# Patient Record
Sex: Female | Born: 1980
Health system: Southern US, Community
[De-identification: ages and names within clinical notes are randomized; demographics above are authoritative.]

## PROBLEM LIST (undated history)

## (undated) DIAGNOSIS — E785 Hyperlipidemia, unspecified: Secondary | ICD-10-CM

## (undated) DIAGNOSIS — E7800 Pure hypercholesterolemia, unspecified: Secondary | ICD-10-CM

## (undated) DIAGNOSIS — O99350 Diseases of the nervous system complicating pregnancy, unspecified trimester: Secondary | ICD-10-CM

## (undated) DIAGNOSIS — N926 Irregular menstruation, unspecified: Secondary | ICD-10-CM

## (undated) DIAGNOSIS — F419 Anxiety disorder, unspecified: Secondary | ICD-10-CM

## (undated) DIAGNOSIS — T7840XA Allergy, unspecified, initial encounter: Secondary | ICD-10-CM

## (undated) DIAGNOSIS — Z8619 Personal history of other infectious and parasitic diseases: Secondary | ICD-10-CM

## (undated) DIAGNOSIS — B079 Viral wart, unspecified: Secondary | ICD-10-CM

## (undated) DIAGNOSIS — G5600 Carpal tunnel syndrome, unspecified upper limb: Secondary | ICD-10-CM

## (undated) DIAGNOSIS — N809 Endometriosis, unspecified: Secondary | ICD-10-CM

## (undated) DIAGNOSIS — Z0000 Encounter for general adult medical examination without abnormal findings: Secondary | ICD-10-CM

## (undated) DIAGNOSIS — L309 Dermatitis, unspecified: Secondary | ICD-10-CM

## (undated) HISTORY — DX: Viral wart, unspecified: B07.9

## (undated) HISTORY — DX: Irregular menstruation, unspecified: N92.6

## (undated) HISTORY — DX: Carpal tunnel syndrome, unspecified upper limb: G56.00

## (undated) HISTORY — DX: Dermatitis, unspecified: L30.9

## (undated) HISTORY — DX: Anxiety disorder, unspecified: F41.9

## (undated) HISTORY — DX: Personal history of other infectious and parasitic diseases: Z86.19

## (undated) HISTORY — DX: Allergy, unspecified, initial encounter: T78.40XA

## (undated) HISTORY — DX: Hyperlipidemia, unspecified: E78.5

## (undated) HISTORY — DX: Encounter for general adult medical examination without abnormal findings: Z00.00

## (undated) HISTORY — DX: Pure hypercholesterolemia, unspecified: E78.00

## (undated) HISTORY — DX: Endometriosis, unspecified: N80.9

## (undated) HISTORY — DX: Diseases of the nervous system complicating pregnancy, unspecified trimester: O99.350

## (undated) HISTORY — PX: MYRINGOTOMY: SUR874

---

## 2010-03-22 HISTORY — PX: EYE SURGERY: SHX253

## 2011-03-05 LAB — OB RESULTS CONSOLE HEPATITIS B SURFACE ANTIGEN: Hepatitis B Surface Ag: NEGATIVE

## 2011-03-05 LAB — OB RESULTS CONSOLE RPR: RPR: NONREACTIVE

## 2011-03-05 LAB — OB RESULTS CONSOLE GC/CHLAMYDIA: Chlamydia: NEGATIVE

## 2011-03-23 DIAGNOSIS — O99350 Diseases of the nervous system complicating pregnancy, unspecified trimester: Secondary | ICD-10-CM

## 2011-03-23 DIAGNOSIS — G5600 Carpal tunnel syndrome, unspecified upper limb: Secondary | ICD-10-CM

## 2011-03-23 HISTORY — DX: Carpal tunnel syndrome, unspecified upper limb: O99.350

## 2011-03-23 HISTORY — DX: Carpal tunnel syndrome, unspecified upper limb: G56.00

## 2011-10-07 ENCOUNTER — Telehealth (HOSPITAL_COMMUNITY): Payer: Self-pay | Admitting: *Deleted

## 2011-10-07 ENCOUNTER — Encounter (HOSPITAL_COMMUNITY): Payer: Self-pay | Admitting: *Deleted

## 2011-10-07 NOTE — Telephone Encounter (Signed)
Preadmission screen  

## 2011-10-10 ENCOUNTER — Inpatient Hospital Stay (HOSPITAL_COMMUNITY): Payer: 59 | Admitting: Anesthesiology

## 2011-10-10 ENCOUNTER — Encounter (HOSPITAL_COMMUNITY): Payer: Self-pay

## 2011-10-10 ENCOUNTER — Encounter (HOSPITAL_COMMUNITY): Payer: Self-pay | Admitting: Anesthesiology

## 2011-10-10 ENCOUNTER — Inpatient Hospital Stay (HOSPITAL_COMMUNITY)
Admission: AD | Admit: 2011-10-10 | Discharge: 2011-10-13 | DRG: 766 | Disposition: A | Discharge status: Home / Self Care | Payer: 59 | Source: Ambulatory Visit | Attending: Obstetrics and Gynecology | Admitting: Obstetrics and Gynecology

## 2011-10-10 LAB — CBC
MCH: 28.9 pg (ref 26.0–34.0)
MCV: 84.3 fL (ref 78.0–100.0)
Platelets: 259 10*3/uL (ref 150–400)
RDW: 12.7 % (ref 11.5–15.5)
WBC: 16 10*3/uL — ABNORMAL HIGH (ref 4.0–10.5)

## 2011-10-10 MED ORDER — OXYTOCIN 40 UNITS IN LACTATED RINGERS INFUSION - SIMPLE MED
62.5000 mL/h | Freq: Once | INTRAVENOUS | Status: DC
  Filled 2011-10-10: qty 1000

## 2011-10-10 MED ORDER — PHENYLEPHRINE 40 MCG/ML (10ML) SYRINGE FOR IV PUSH (FOR BLOOD PRESSURE SUPPORT): 80.0000 ug | PREFILLED_SYRINGE | INTRAVENOUS | Status: DC | PRN

## 2011-10-10 MED ORDER — MORPHINE SULFATE 0.5 MG/ML IJ SOLN
INTRAMUSCULAR | Status: AC
  Filled 2011-10-10: qty 10

## 2011-10-10 MED ORDER — FENTANYL 2.5 MCG/ML BUPIVACAINE 1/10 % EPIDURAL INFUSION (WH - ANES)
14.0000 mL/h | INTRAMUSCULAR | Status: DC
  Administered 2011-10-10 (×2): 14 mL/h via EPIDURAL
  Filled 2011-10-10 (×4): qty 60

## 2011-10-10 MED ORDER — BUTORPHANOL TARTRATE 2 MG/ML IJ SOLN
1.0000 mg | Freq: Once | INTRAMUSCULAR | Status: AC
Start: 2011-10-10 — End: 2011-10-10
  Administered 2011-10-10: 1 mg via INTRAVENOUS

## 2011-10-10 MED ORDER — CITRIC ACID-SODIUM CITRATE 334-500 MG/5ML PO SOLN: 30.0000 mL | ORAL | Status: DC | PRN

## 2011-10-10 MED ORDER — ONDANSETRON HCL 4 MG/2ML IJ SOLN: 4.0000 mg | Freq: Four times a day (QID) | INTRAMUSCULAR | Status: DC | PRN

## 2011-10-10 MED ORDER — LACTATED RINGERS IV SOLN
INTRAVENOUS | Status: DC
  Administered 2011-10-10: 14:00:00 via INTRAVENOUS

## 2011-10-10 MED ORDER — DIPHENHYDRAMINE HCL 50 MG/ML IJ SOLN: 12.5000 mg | INTRAMUSCULAR | Status: DC | PRN

## 2011-10-10 MED ORDER — LIDOCAINE HCL (PF) 1 % IJ SOLN
INTRAMUSCULAR | Status: DC | PRN
  Administered 2011-10-10 (×2): 5 mL

## 2011-10-10 MED ORDER — LIDOCAINE HCL (PF) 1 % IJ SOLN: 30.0000 mL | INTRAMUSCULAR | Status: DC | PRN

## 2011-10-10 MED ORDER — PHENYLEPHRINE 40 MCG/ML (10ML) SYRINGE FOR IV PUSH (FOR BLOOD PRESSURE SUPPORT)
80.0000 ug | PREFILLED_SYRINGE | INTRAVENOUS | Status: DC | PRN
  Filled 2011-10-10: qty 5

## 2011-10-10 MED ORDER — LACTATED RINGERS IV SOLN: 500.0000 mL | INTRAVENOUS | Status: DC | PRN

## 2011-10-10 MED ORDER — LACTATED RINGERS IV SOLN: 500.0000 mL | Freq: Once | INTRAVENOUS | Status: DC

## 2011-10-10 MED ORDER — OXYCODONE-ACETAMINOPHEN 5-325 MG PO TABS: 1.0000 | ORAL_TABLET | ORAL | Status: DC | PRN

## 2011-10-10 MED ORDER — EPHEDRINE 5 MG/ML INJ
10.0000 mg | INTRAVENOUS | Status: DC | PRN
  Filled 2011-10-10: qty 4

## 2011-10-10 MED ORDER — ONDANSETRON HCL 4 MG/2ML IJ SOLN
INTRAMUSCULAR | Status: AC
  Filled 2011-10-10: qty 2

## 2011-10-10 MED ORDER — EPHEDRINE 5 MG/ML INJ: 10.0000 mg | INTRAVENOUS | Status: DC | PRN

## 2011-10-10 MED ORDER — OXYTOCIN BOLUS FROM INFUSION
250.0000 mL | Freq: Once | INTRAVENOUS | Status: DC
  Filled 2011-10-10: qty 500

## 2011-10-10 MED ORDER — FLEET ENEMA 7-19 GM/118ML RE ENEM: 1.0000 | ENEMA | RECTAL | Status: DC | PRN

## 2011-10-10 MED ORDER — OXYTOCIN 10 UNIT/ML IJ SOLN
INTRAMUSCULAR | Status: AC
  Filled 2011-10-10: qty 4

## 2011-10-10 MED ORDER — IBUPROFEN 600 MG PO TABS: 600.0000 mg | ORAL_TABLET | Freq: Four times a day (QID) | ORAL | Status: DC | PRN

## 2011-10-10 MED ORDER — BUTORPHANOL TARTRATE 2 MG/ML IJ SOLN
1.0000 mg | INTRAMUSCULAR | Status: AC
  Administered 2011-10-10: 1 mg via INTRAMUSCULAR
  Filled 2011-10-10 (×2): qty 1

## 2011-10-10 MED ORDER — ACETAMINOPHEN 325 MG PO TABS
650.0000 mg | ORAL_TABLET | ORAL | Status: DC | PRN
  Filled 2011-10-10: qty 2

## 2011-10-10 NOTE — Progress Notes (Signed)
Dr Rana Snare notified of patient, patient is very uncomfortable (wants to try natural birth), ctx pattern, sve results. Order to administer stadol 1mg  im and may repeat in an hour. Call dr Rana Snare with update.

## 2011-10-10 NOTE — H&P (Signed)
Stacy Dennis is a 31 y.o. female presenting for labor check.  Painful back and front ctxs q 3-5 minutes.  GBS-. History OB History    Grav Para Term Preterm Abortions TAB SAB Ect Mult Living   1              Past Medical History  Diagnosis Date  . Pregnancy related carpal tunnel syndrome, antepartum   . Endometriosis   . H/O varicella   . Hypercholesteremia     no meds   Past Surgical History  Procedure Date  . Eye surgery 2012    lasik  . Myringotomy    Family History: family history includes Anxiety disorder in her mother; Cancer in her paternal grandfather; Crohn's disease in her paternal aunt; Hyperlipidemia in her father and paternal grandmother; Kidney disease in her father; Stroke in her paternal grandfather; and Thyroid disease in her maternal aunt. Social History:  reports that she has never smoked. She has never used smokeless tobacco. She reports that she does not drink alcohol or use illicit drugs.   Prenatal Transfer Tool  Maternal Diabetes: No Genetic Screening: Normal Maternal Ultrasounds/Referrals: Normal Fetal Ultrasounds or other Referrals:  None Maternal Substance Abuse:  No Significant Maternal Medications:  None Significant Maternal Lab Results:  None Other Comments:  None  ROS  Dilation: 3 Effacement (%): 90 Station: -2 Exam by:: Tarrance Januszewski Blood pressure 124/90, pulse 104, temperature 97.8 F (36.6 C), temperature source Oral, resp. rate 18, height 4\' 11"  (1.499 m), weight 72.576 kg (160 lb), last menstrual period 01/02/2011. Exam Physical Exam  Prenatal labs: ABO, Rh: B/Positive/-- (12/14 0000) Antibody: Negative (12/14 0000) Rubella: Immune (12/14 0000) RPR: Nonreactive (12/14 0000)  HBsAg: Negative (12/14 0000)  HIV: Non-reactive (12/14 0000)  GBS: Negative (06/18 0000)   Assessment/Plan: IUP at term with labor.  Some relief with stadol but is considering epidural Pt has borderline pelvis, so will monitor progression   Sharay Bellissimo  C 10/10/2011, 10:34 AM

## 2011-10-10 NOTE — Anesthesia Preprocedure Evaluation (Signed)
Anesthesia Evaluation  Patient identified by MRN, date of birth, ID band Patient awake    Reviewed: Allergy & Precautions, H&P , Patient's Chart, lab work & pertinent test results  Airway Mallampati: II TM Distance: >3 FB Neck ROM: full    Dental No notable dental hx.    Pulmonary neg pulmonary ROS,  breath sounds clear to auscultation  Pulmonary exam normal       Cardiovascular negative cardio ROS  Rhythm:regular Rate:Normal     Neuro/Psych  Neuromuscular disease negative neurological ROS  negative psych ROS   GI/Hepatic negative GI ROS, Neg liver ROS,   Endo/Other  negative endocrine ROS  Renal/GU negative Renal ROS     Musculoskeletal   Abdominal   Peds  Hematology negative hematology ROS (+)   Anesthesia Other Findings Pregnancy related carpal tunnel syndrome, antepartum     Endometriosis        H/O varicella     Hypercholesteremia   no meds    Reproductive/Obstetrics (+) Pregnancy                           Anesthesia Physical Anesthesia Plan  ASA: II  Anesthesia Plan: Epidural   Post-op Pain Management:    Induction:   Airway Management Planned:   Additional Equipment:   Intra-op Plan:   Post-operative Plan:   Informed Consent: I have reviewed the patients History and Physical, chart, labs and discussed the procedure including the risks, benefits and alternatives for the proposed anesthesia with the patient or authorized representative who has indicated his/her understanding and acceptance.     Plan Discussed with:   Anesthesia Plan Comments:         Anesthesia Quick Evaluation

## 2011-10-10 NOTE — Anesthesia Procedure Notes (Signed)
Epidural Patient location during procedure: OB Start time: 10/10/2011 11:03 AM  Staffing Anesthesiologist: Brayton Caves R Performed by: anesthesiologist   Preanesthetic Checklist Completed: patient identified, site marked, surgical consent, pre-op evaluation, timeout performed, IV checked, risks and benefits discussed and monitors and equipment checked  Epidural Patient position: sitting Prep: site prepped and draped and DuraPrep Patient monitoring: continuous pulse ox and blood pressure Approach: midline Injection technique: LOR air and LOR saline  Needle:  Needle type: Tuohy  Needle gauge: 17 G Needle length: 9 cm Needle insertion depth: 5 cm cm Catheter type: closed end flexible Catheter size: 19 Gauge Catheter at skin depth: 10 cm Test dose: negative  Assessment Events: blood not aspirated, injection not painful, no injection resistance, negative IV test and no paresthesia  Additional Notes Patient identified.  Risk benefits discussed including failed block, incomplete pain control, headache, nerve damage, paralysis, blood pressure changes, nausea, vomiting, reactions to medication both toxic or allergic, and postpartum back pain.  Patient expressed understanding and wished to proceed.  All questions were answered.  Sterile technique used throughout procedure and epidural site dressed with sterile barrier dressing. No paresthesia or other complications noted.The patient did not experience any signs of intravascular injection such as tinnitus or metallic taste in mouth nor signs of intrathecal spread such as rapid motor block. Please see nursing notes for vital signs.

## 2011-10-10 NOTE — Progress Notes (Signed)
Patient requests her cervix to be re-checked at 0800 and if unchanged she will take the ordered dose of stadol and go home- Dr. Rana Snare notified. If cervical exam is changed from previous will call Dr. Rana Snare for plan of care.

## 2011-10-10 NOTE — MAU Note (Signed)
Patient is in with c/o frequent/painful contractions, pelvic pressure and intense back pain since 2300. She reports good fetal movement, denies lof.

## 2011-10-11 ENCOUNTER — Encounter (HOSPITAL_COMMUNITY): Payer: Self-pay | Admitting: *Deleted

## 2011-10-11 LAB — CBC
HCT: 27.7 % — ABNORMAL LOW (ref 36.0–46.0)
Hemoglobin: 9.7 g/dL — ABNORMAL LOW (ref 12.0–15.0)
MCH: 29.7 pg (ref 26.0–34.0)
RBC: 3.27 MIL/uL — ABNORMAL LOW (ref 3.87–5.11)

## 2011-10-11 MED ORDER — GENTAMICIN SULFATE 40 MG/ML IJ SOLN: Freq: Once | INTRAVENOUS | Status: DC

## 2011-10-11 MED ORDER — SIMETHICONE 80 MG PO CHEW
80.0000 mg | CHEWABLE_TABLET | Freq: Three times a day (TID) | ORAL | Status: DC
  Administered 2011-10-11 – 2011-10-13 (×9): 80 mg via ORAL

## 2011-10-11 MED ORDER — SIMETHICONE 80 MG PO CHEW: 80.0000 mg | CHEWABLE_TABLET | Freq: Three times a day (TID) | ORAL | Status: DC

## 2011-10-11 MED ORDER — ZOLPIDEM TARTRATE 5 MG PO TABS: 5.0000 mg | ORAL_TABLET | Freq: Every evening | ORAL | Status: DC | PRN

## 2011-10-11 MED ORDER — ONDANSETRON HCL 4 MG PO TABS: 4.0000 mg | ORAL_TABLET | ORAL | Status: DC | PRN

## 2011-10-11 MED ORDER — LANOLIN HYDROUS EX OINT: 1.0000 "application " | TOPICAL_OINTMENT | CUTANEOUS | Status: DC | PRN

## 2011-10-11 MED ORDER — NALBUPHINE HCL 10 MG/ML IJ SOLN
5.0000 mg | INTRAMUSCULAR | Status: DC | PRN
  Filled 2011-10-11: qty 1

## 2011-10-11 MED ORDER — SIMETHICONE 80 MG PO CHEW: 80.0000 mg | CHEWABLE_TABLET | ORAL | Status: DC | PRN

## 2011-10-11 MED ORDER — ONDANSETRON HCL 4 MG/2ML IJ SOLN: 4.0000 mg | INTRAMUSCULAR | Status: DC | PRN

## 2011-10-11 MED ORDER — MENTHOL 3 MG MT LOZG: 1.0000 | LOZENGE | OROMUCOSAL | Status: DC | PRN

## 2011-10-11 MED ORDER — DIPHENHYDRAMINE HCL 50 MG/ML IJ SOLN: 25.0000 mg | INTRAMUSCULAR | Status: DC | PRN

## 2011-10-11 MED ORDER — KETOROLAC TROMETHAMINE 60 MG/2ML IM SOLN
60.0000 mg | Freq: Once | INTRAMUSCULAR | Status: AC | PRN
  Filled 2011-10-11: qty 2

## 2011-10-11 MED ORDER — KETOROLAC TROMETHAMINE 30 MG/ML IJ SOLN
INTRAMUSCULAR | Status: AC
  Filled 2011-10-11: qty 1

## 2011-10-11 MED ORDER — DIBUCAINE 1 % RE OINT: 1.0000 "application " | TOPICAL_OINTMENT | RECTAL | Status: DC | PRN

## 2011-10-11 MED ORDER — WITCH HAZEL-GLYCERIN EX PADS: 1.0000 "application " | MEDICATED_PAD | CUTANEOUS | Status: DC | PRN

## 2011-10-11 MED ORDER — DIPHENHYDRAMINE HCL 25 MG PO CAPS: 25.0000 mg | ORAL_CAPSULE | ORAL | Status: DC | PRN

## 2011-10-11 MED ORDER — KETOROLAC TROMETHAMINE 30 MG/ML IJ SOLN: 30.0000 mg | Freq: Four times a day (QID) | INTRAMUSCULAR | Status: AC | PRN

## 2011-10-11 MED ORDER — OXYCODONE-ACETAMINOPHEN 5-325 MG PO TABS: 1.0000 | ORAL_TABLET | ORAL | Status: DC | PRN

## 2011-10-11 MED ORDER — SODIUM CHLORIDE 0.9 % IJ SOLN: 3.0000 mL | INTRAMUSCULAR | Status: DC | PRN

## 2011-10-11 MED ORDER — NALOXONE HCL 0.4 MG/ML IJ SOLN: 0.4000 mg | INTRAMUSCULAR | Status: DC | PRN

## 2011-10-11 MED ORDER — ONDANSETRON HCL 4 MG/2ML IJ SOLN: 4.0000 mg | Freq: Three times a day (TID) | INTRAMUSCULAR | Status: DC | PRN

## 2011-10-11 MED ORDER — IBUPROFEN 600 MG PO TABS: 600.0000 mg | ORAL_TABLET | Freq: Four times a day (QID) | ORAL | Status: DC | PRN

## 2011-10-11 MED ORDER — DIPHENHYDRAMINE HCL 25 MG PO CAPS: 25.0000 mg | ORAL_CAPSULE | Freq: Four times a day (QID) | ORAL | Status: DC | PRN

## 2011-10-11 MED ORDER — SCOPOLAMINE 1 MG/3DAYS TD PT72
1.0000 | MEDICATED_PATCH | Freq: Once | TRANSDERMAL | Status: DC
  Filled 2011-10-11: qty 1

## 2011-10-11 MED ORDER — LACTATED RINGERS IV SOLN: INTRAVENOUS | Status: DC

## 2011-10-11 MED ORDER — SODIUM CHLORIDE 0.9 % IV SOLN
1.0000 ug/kg/h | INTRAVENOUS | Status: DC | PRN
  Filled 2011-10-11: qty 2.5

## 2011-10-11 MED ORDER — PRENATAL MULTIVITAMIN CH: 1.0000 | ORAL_TABLET | Freq: Every day | ORAL | Status: DC

## 2011-10-11 MED ORDER — SENNOSIDES-DOCUSATE SODIUM 8.6-50 MG PO TABS
2.0000 | ORAL_TABLET | Freq: Every day | ORAL | Status: DC
Start: 2011-10-11 — End: 2011-10-13
  Administered 2011-10-12: 2 via ORAL

## 2011-10-11 MED ORDER — DIPHENHYDRAMINE HCL 50 MG/ML IJ SOLN: 12.5000 mg | INTRAMUSCULAR | Status: DC | PRN

## 2011-10-11 MED ORDER — IBUPROFEN 600 MG PO TABS
600.0000 mg | ORAL_TABLET | Freq: Four times a day (QID) | ORAL | Status: DC
  Administered 2011-10-11 – 2011-10-13 (×9): 600 mg via ORAL
  Filled 2011-10-11 (×9): qty 1

## 2011-10-11 MED ORDER — PRENATAL MULTIVITAMIN CH
1.0000 | ORAL_TABLET | Freq: Every day | ORAL | Status: DC
  Filled 2011-10-11 (×3): qty 1

## 2011-10-11 MED ORDER — MEPERIDINE HCL 25 MG/ML IJ SOLN: 6.2500 mg | INTRAMUSCULAR | Status: DC | PRN

## 2011-10-11 MED ORDER — SCOPOLAMINE 1 MG/3DAYS TD PT72
MEDICATED_PATCH | TRANSDERMAL | Status: AC
  Filled 2011-10-11: qty 1

## 2011-10-11 MED ORDER — METOCLOPRAMIDE HCL 5 MG/ML IJ SOLN: 10.0000 mg | Freq: Three times a day (TID) | INTRAMUSCULAR | Status: DC | PRN

## 2011-10-11 NOTE — Op Note (Signed)
Cesarean Section Procedure Note  Pre-operative Diagnosis: IUP at 40+ weeks, Arrest of dilation, Nonreassuring fetal heart rate  Post-operative Diagnosis: same plus meconium   Surgeon: Artist Bloom C   Assistants:   Anesthesia:epidural  Indications:  Slow progression throughout day to 6cm and despite Pitocin augmentation to adequate labor pattern, no further dilation beyond 6 cm.  Fetal tachycardia to 170 with several, non repetative late decels, so decision for primary C/S made.  R&B discussed and informed consent obtained.  Procedure:  Low Segment Transverse cesarean section  Procedure Details  The patient was seen in the Holding Room. The risks, benefits, complications, treatment options, and expected outcomes were discussed with the patient.  The patient concurred with the proposed plan, giving informed consent.  The site of surgery properly noted/marked.. A Time Out was held and the above information confirmed.  After induction of anesthesia, the patient was draped and prepped in the usual sterile manner. A Pfannenstiel incision was made and carried down through the subcutaneous tissue to the fascia. Fascial incision was made and extended transversely. The fascia was separated from the underlying rectus tissue superiorly and inferiorly. The peritoneum was identified and entered. Peritoneal incision was extended longitudinally. The utero-vesical peritoneal reflection was incised transversely and the bladder flap was bluntly freed from the lower uterine segment. A low transverse uterine incision was made. Delivered from LOT presentation was a  baby with Apgar scores of 8 at one minute and 9 at five minutes. After the umbilical cord was clamped and cut cord blood was obtained for evaluation. The placenta was removed intact and appeared normal. The uterine outline, tubes and ovaries appeared normal. The uterine incision was closed with running locked sutures of 0 monocryl and imbricated with 0  monocryl. Hemostasis was observed. Lavage was carried out until clear. The peritoneum was then closed with 0 monocryl and rectus muscles plicated in the midline.  After hemostasis was assured, the fascia was then reapproximated with running sutures of 0 Vicryl. Irrigation was applied and after adequate hemostasis was assured, the skin was reapproximated with staples.  Instrument, sponge, and needle counts were correct prior the abdominal closure and at the conclusion of the case. The patient received clindamycin and gentamicin preoperatively.  Findings: Viable female, ph art 7.25  Estimated Blood Loss:  600cc         Specimens: Placenta was sent to L&D         Complications:  None

## 2011-10-11 NOTE — Progress Notes (Signed)
Subjective: Postpartum Day 1: Cesarean Delivery Patient reports tolerating PO.    Objective: Vital signs in last 24 hours: Temp:  [98.4 F (36.9 C)-98.8 F (37.1 C)] 98.4 F (36.9 C) (07/22 0450) Pulse Rate:  [43-132] 96  (07/22 0450) Resp:  [18] 18  (07/22 0450) BP: (101-138)/(59-90) 108/69 mmHg (07/22 0450) SpO2:  [87 %-100 %] 95 % (07/22 0450) Weight:  [72.576 kg (160 lb)] 72.576 kg (160 lb) (07/21 0914)  Physical Exam:  General: alert, cooperative and no distress Lochia: appropriate Uterine Fundus: firm Incision: Dressing C&D DVT Evaluation: No evidence of DVT seen on physical exam.   Basename 10/11/11 0515 10/10/11 0820  HGB 9.7* 12.7  HCT 27.7* 37.1    Assessment/Plan: Status post Cesarean section. Doing well postoperatively.  Continue current care.  Vesper Trant II,Floretta Petro E 10/11/2011, 8:02 AM

## 2011-10-11 NOTE — Anesthesia Postprocedure Evaluation (Signed)
  Anesthesia Post-op Note  Patient: Stacy Dennis  Procedure(s) Performed: * No surgery found *  Patient Location: Mother/Baby  Anesthesia Type: Epidural  Level of Consciousness: awake  Airway and Oxygen Therapy: Patient Spontanous Breathing  Post-op Pain: none  Post-op Assessment: Patient's Cardiovascular Status Stable, Respiratory Function Stable, Patent Airway, No signs of Nausea or vomiting, Adequate PO intake, Pain level controlled, No headache, No backache, No residual numbness and No residual motor weakness  Post-op Vital Signs: Reviewed and stable  Complications: No apparent anesthesia complications

## 2011-10-11 NOTE — Progress Notes (Signed)
UR chart review completed.  

## 2011-10-12 ENCOUNTER — Encounter (HOSPITAL_COMMUNITY): Payer: Self-pay | Admitting: *Deleted

## 2011-10-12 ENCOUNTER — Inpatient Hospital Stay (HOSPITAL_COMMUNITY): Admission: RE | Admit: 2011-10-12 | Payer: 59 | Source: Ambulatory Visit

## 2011-10-12 LAB — CBC
MCH: 29.6 pg (ref 26.0–34.0)
Platelets: 198 10*3/uL (ref 150–400)
RBC: 3.14 MIL/uL — ABNORMAL LOW (ref 3.87–5.11)
RDW: 13.3 % (ref 11.5–15.5)
WBC: 17.2 10*3/uL — ABNORMAL HIGH (ref 4.0–10.5)

## 2011-10-12 NOTE — Progress Notes (Signed)
Subjective: Postpartum Day 2: Cesarean Delivery Patient reports tolerating PO, + flatus and no problems voiding.    Objective: Vital signs in last 24 hours: Temp:  [98 F (36.7 C)-98.4 F (36.9 C)] 98.3 F (36.8 C) (07/23 0600) Pulse Rate:  [82-100] 82  (07/23 0600) Resp:  [18-20] 18  (07/23 0600) BP: (101-111)/(64-70) 103/68 mmHg (07/23 0600) SpO2:  [96 %] 96 % (07/22 2230)  Physical Exam:  General: alert and cooperative Lochia: appropriate Uterine Fundus: firm Incision: healing well, staples intact DVT Evaluation: No evidence of DVT seen on physical exam. Negative Homan's sign.   Basename 10/12/11 0545 10/11/11 0515  HGB 9.3* 9.7*  HCT 27.4* 27.7*    Assessment/Plan: Status post Cesarean section. Doing well postoperatively.  Continue current care.  Orah Sonnen G 10/12/2011, 7:52 AM

## 2011-10-13 MED ORDER — OXYCODONE-ACETAMINOPHEN 5-325 MG PO TABS: 1.0000 | ORAL_TABLET | ORAL | Status: AC | PRN

## 2011-10-13 MED ORDER — IBUPROFEN 600 MG PO TABS: 600.0000 mg | ORAL_TABLET | Freq: Four times a day (QID) | ORAL | Status: AC

## 2011-10-13 NOTE — Discharge Summary (Signed)
Obstetric Discharge Summary Reason for Admission: onset of labor Prenatal Procedures: ultrasound Intrapartum Procedures: cesarean: low cervical, transverse Postpartum Procedures: none Complications-Operative and Postpartum: none Hemoglobin  Date Value Range Status  10/12/2011 9.3* 12.0 - 15.0 g/dL Final     HCT  Date Value Range Status  10/12/2011 27.4* 36.0 - 46.0 % Final    Physical Exam:  General: alert and cooperative Lochia: appropriate Uterine Fundus: firm Incision: healing well, staples removed DVT Evaluation: No evidence of DVT seen on physical exam.  Discharge Diagnoses: Term Pregnancy-delivered  Discharge Information: Date: 10/13/2011 Activity: pelvic rest Diet: routine Medications: PNV, Ibuprofen and Percocet Condition: stable Instructions: refer to practice specific booklet Discharge to: home   Newborn Data: Live born female  Birth Weight: 8 lb 0.4 oz (3640 g) APGAR: 8, 9  Home with mother.  Stacy Dennis G 10/13/2011, 8:31 AM

## 2011-12-28 DIAGNOSIS — H179 Unspecified corneal scar and opacity: Secondary | ICD-10-CM | POA: Insufficient documentation

## 2012-01-28 ENCOUNTER — Encounter: Payer: Self-pay | Admitting: Family Medicine

## 2012-01-28 ENCOUNTER — Ambulatory Visit (INDEPENDENT_AMBULATORY_CARE_PROVIDER_SITE_OTHER): Payer: 59 | Admitting: Family Medicine

## 2012-01-28 VITALS — BP 115/78 | HR 64 | Temp 98.4°F | Ht 59.0 in | Wt 124.8 lb

## 2012-01-28 DIAGNOSIS — E785 Hyperlipidemia, unspecified: Secondary | ICD-10-CM

## 2012-01-28 DIAGNOSIS — Z0000 Encounter for general adult medical examination without abnormal findings: Secondary | ICD-10-CM

## 2012-01-28 DIAGNOSIS — B079 Viral wart, unspecified: Secondary | ICD-10-CM | POA: Insufficient documentation

## 2012-01-28 DIAGNOSIS — T7840XA Allergy, unspecified, initial encounter: Secondary | ICD-10-CM

## 2012-01-28 HISTORY — DX: Allergy, unspecified, initial encounter: T78.40XA

## 2012-01-28 HISTORY — DX: Hyperlipidemia, unspecified: E78.5

## 2012-01-28 HISTORY — DX: Viral wart, unspecified: B07.9

## 2012-01-28 HISTORY — DX: Encounter for general adult medical examination without abnormal findings: Z00.00

## 2012-01-28 MED ORDER — KRILL OIL PO CAPS: ORAL_CAPSULE | ORAL | Status: DC

## 2012-01-28 NOTE — Assessment & Plan Note (Signed)
May try Claritin and Allegra prn

## 2012-01-28 NOTE — Progress Notes (Signed)
Patient ID: Stacy Dennis, female   DOB: 08-07-1980, 31 y.o.   MRN: [id number] SHAWN DANNENBERG [id number] March 09, 1981 01/28/2012      Progress Note New Patient  Subjective  Chief Complaint  Chief Complaint  Patient presents with  . Establish Care    new patient    HPI  Patient is a 31 year old Caucasian female who is in today for new patient appointment. She has some mild for today. Mostly she wants to just establish care. She's recently moved to the area and has just delivered her first child who is now 38 months of age. She works as a Adult nurse in the consistent. She has a recurrence route as lesions on her left shoulder which she's had frozen multiple times but unfortunately they then returned. They're not itchy or painful. She also notes chisel long history of elevated cholesterol which runs in her family. Both of her parents struggle with this. She does exercise regularly avoids transplant and tries to eat a heart healthy diet. No other recent injury or concerns. No fevers. She is of some low-grade congestion secondary to allergies but is not taking any medications and she is nursing her child.  Physical Exam  Constitutional: She is oriented to person, place, and time and well-developed, well-nourished, and in no distress. No distress.  HENT:  Head: Normocephalic and atraumatic.  Right Ear: External ear normal.  Left Ear: External ear normal.  Nose: Nose normal.  Mouth/Throat: Oropharynx is clear and moist. No oropharyngeal exudate.  Eyes: Conjunctivae normal are normal. Pupils are equal, round, and reactive to light. Right eye exhibits no discharge. Left eye exhibits no discharge. No scleral icterus.  Neck: Normal range of motion. Neck supple. No thyromegaly present.  Cardiovascular: Normal rate, regular rhythm, normal heart sounds and intact distal pulses.   No murmur heard. Pulmonary/Chest: Effort normal and breath sounds normal. No respiratory distress. She has no  wheezes. She has no rales.  Abdominal: Soft. Bowel sounds are normal. She exhibits no distension and no mass. There is no tenderness.  Musculoskeletal: Normal range of motion. She exhibits no edema and no tenderness.  Lymphadenopathy:    She has no cervical adenopathy.  Neurological: She is alert and oriented to person, place, and time. She has normal reflexes. No cranial nerve deficit. Coordination normal.  Skin: Skin is warm and dry. No rash noted. She is not diaphoretic.       3 verrucous lesions on right posterior shoulder, 2 pedunculated.  Psychiatric: Mood, memory and affect normal.    Past Medical History  Diagnosis Date  . Pregnancy related carpal tunnel syndrome, antepartum   . Endometriosis   . H/O varicella   . Hypercholesteremia     no meds  . Verrucous skin lesion 01/28/2012  . Hyperlipidemia 01/28/2012  . Preventative health care 01/28/2012    Past Surgical History  Procedure Date  . Eye surgery 2012    lasik  . Myringotomy   . Cesarean section 10-10-11    Family History  Problem Relation Age of Onset  . Anxiety disorder Mother   . Hyperlipidemia Mother   . Hypertension Mother   . Irritable bowel syndrome Mother     ?  . Kidney disease Father     stones  . Hyperlipidemia Father   . Thyroid disease Maternal Aunt   . Cancer Paternal Grandfather     lung- smoker  . Stroke Paternal Grandfather   . Aneurysm Paternal Grandfather   . Crohn's disease Paternal  Aunt   . Hyperlipidemia Paternal Grandmother   . Hypertension Maternal Grandmother   . Heart disease Maternal Grandfather     History   Social History  . Marital Status: Married    Spouse Name: N/A    Number of Children: N/A  . Years of Education: N/A   Occupational History  . Not on file.   Social History Main Topics  . Smoking status: Never Smoker   . Smokeless tobacco: Never Used  . Alcohol Use: Yes     Comment: socially  . Drug Use: No  . Sexually Active: Yes -- Female partner(s)   Other  Topics Concern  . Not on file   Social History Narrative  . No narrative on file    Current Outpatient Prescriptions on File Prior to Visit  Medication Sig Dispense Refill  . Prenatal Vit-Fe Fumarate-FA (PRENATAL MULTIVITAMIN) TABS Take 1 tablet by mouth daily.        Allergies  Allergen Reactions  . Amoxicillin Nausea And Vomiting  . Lactose Intolerance (Gi)     Review of Systems  Review of Systems  Constitutional: Negative for fever, chills and malaise/fatigue.  HENT: Negative for hearing loss, nosebleeds and congestion.   Eyes: Negative for discharge.  Respiratory: Negative for cough, sputum production, shortness of breath and wheezing.   Cardiovascular: Negative for chest pain, palpitations and leg swelling.  Gastrointestinal: Negative for heartburn, nausea, vomiting, abdominal pain, diarrhea, constipation and blood in stool.  Genitourinary: Negative for dysuria, urgency, frequency and hematuria.  Musculoskeletal: Negative for myalgias, back pain and falls.  Skin: Positive for rash.       Lesions right shoulder  Neurological: Negative for dizziness, tremors, sensory change, focal weakness, loss of consciousness, weakness and headaches.  Endo/Heme/Allergies: Negative for polydipsia. Does not bruise/bleed easily.  Psychiatric/Behavioral: Negative for depression and suicidal ideas. The patient is not nervous/anxious and does not have insomnia.     Objective  BP 115/78  Pulse 64  Temp 98.4 F (36.9 C) (Temporal)  Ht 4\' 11"  (1.499 m)  Wt 124 lb 12.8 oz (56.609 kg)  BMI 25.21 kg/m2  SpO2 98%  LMP 12/28/2011  Breastfeeding? Yes  Physical Exam  See above     Assessment & Plan  Hyperlipidemia Tries to eat a heart healthy diet and avoid trans fats. Exercises, encouraged to add MegaRed krill oil caps and recheck lipid panel, fasting next week.  Verrucous skin lesion Recurrent on right shoulder. Subjected to cryotherapy on the 3 lesions in office, patient  tolerated well, encouraged to take folic Acid 1 mg daily. Call for referral to dermatology if no improvement  Preventative health care Check fasting labs. Had flu shot in October and Tdap in January of 2013. Sees gyn and just delivered her first child 3 months ago. Encouraged heart healthy diet and ongoing exercise  Allergic state May try Claritin and Allegra prn

## 2012-01-28 NOTE — Assessment & Plan Note (Signed)
Recurrent on right shoulder. Subjected to cryotherapy on the 3 lesions in office, patient tolerated well, encouraged to take folic Acid 1 mg daily. Call for referral to dermatology if no improvement

## 2012-01-28 NOTE — Assessment & Plan Note (Signed)
Check fasting labs. Had flu shot in October and Tdap in January of 2013. Sees gyn and just delivered her first child 3 months ago. Encouraged heart healthy diet and ongoing exercise

## 2012-01-28 NOTE — Patient Instructions (Addendum)
Preventive Care for Adults, Female A healthy lifestyle and preventive care can promote health and wellness. Preventive health guidelines for women include the following key practices.  A routine yearly physical is a good way to check with your caregiver about your health and preventive screening. It is a chance to share any concerns and updates on your health, and to receive a thorough exam.  Visit your dentist for a routine exam and preventive care every 6 months. Brush your teeth twice a day and floss once a day. Good oral hygiene prevents tooth decay and gum disease.  The frequency of eye exams is based on your age, health, family medical history, use of contact lenses, and other factors. Follow your caregiver's recommendations for frequency of eye exams.  Eat a healthy diet. Foods like vegetables, fruits, whole grains, low-fat dairy products, and lean protein foods contain the nutrients you need without too many calories. Decrease your intake of foods high in solid fats, added sugars, and salt. Eat the right amount of calories for you.Get information about a proper diet from your caregiver, if necessary.  Regular physical exercise is one of the most important things you can do for your health. Most adults should get at least 150 minutes of moderate-intensity exercise (any activity that increases your heart rate and causes you to sweat) each week. In addition, most adults need muscle-strengthening exercises on 2 or more days a week.  Maintain a healthy weight. The body mass index (BMI) is a screening tool to identify possible weight problems. It provides an estimate of body fat based on height and weight. Your caregiver can help determine your BMI, and can help you achieve or maintain a healthy weight.For adults 20 years and older:  A BMI below 18.5 is considered underweight.  A BMI of 18.5 to 24.9 is normal.  A BMI of 25 to 29.9 is considered overweight.  A BMI of 30 and above is  considered obese.  Maintain normal blood lipids and cholesterol levels by exercising and minimizing your intake of saturated fat. Eat a balanced diet with plenty of fruit and vegetables. Blood tests for lipids and cholesterol should begin at age 20 and be repeated every 5 years. If your lipid or cholesterol levels are high, you are over 50, or you are at high risk for heart disease, you may need your cholesterol levels checked more frequently.Ongoing high lipid and cholesterol levels should be treated with medicines if diet and exercise are not effective.  If you smoke, find out from your caregiver how to quit. If you do not use tobacco, do not start.  If you are pregnant, do not drink alcohol. If you are breastfeeding, be very cautious about drinking alcohol. If you are not pregnant and choose to drink alcohol, do not exceed 1 drink per day. One drink is considered to be 12 ounces (355 mL) of beer, 5 ounces (148 mL) of wine, or 1.5 ounces (44 mL) of liquor.  Avoid use of street drugs. Do not share needles with anyone. Ask for help if you need support or instructions about stopping the use of drugs.  High blood pressure causes heart disease and increases the risk of stroke. Your blood pressure should be checked at least every 1 to 2 years. Ongoing high blood pressure should be treated with medicines if weight loss and exercise are not effective.  If you are 55 to 31 years old, ask your caregiver if you should take aspirin to prevent strokes.  Diabetes   screening involves taking a blood sample to check your fasting blood sugar level. This should be done once every 3 years, after age 45, if you are within normal weight and without risk factors for diabetes. Testing should be considered at a younger age or be carried out more frequently if you are overweight and have at least 1 risk factor for diabetes.  Breast cancer screening is essential preventive care for women. You should practice "breast  self-awareness." This means understanding the normal appearance and feel of your breasts and may include breast self-examination. Any changes detected, no matter how small, should be reported to a caregiver. Women in their 20s and 30s should have a clinical breast exam (CBE) by a caregiver as part of a regular health exam every 1 to 3 years. After age 40, women should have a CBE every year. Starting at age 40, women should consider having a mammography (breast X-ray test) every year. Women who have a family history of breast cancer should talk to their caregiver about genetic screening. Women at a high risk of breast cancer should talk to their caregivers about having magnetic resonance imaging (MRI) and a mammography every year.  The Pap test is a screening test for cervical cancer. A Pap test can show cell changes on the cervix that might become cervical cancer if left untreated. A Pap test is a procedure in which cells are obtained and examined from the lower end of the uterus (cervix).  Women should have a Pap test starting at age 21.  Between ages 21 and 29, Pap tests should be repeated every 2 years.  Beginning at age 30, you should have a Pap test every 3 years as long as the past 3 Pap tests have been normal.  Some women have medical problems that increase the chance of getting cervical cancer. Talk to your caregiver about these problems. It is especially important to talk to your caregiver if a new problem develops soon after your last Pap test. In these cases, your caregiver may recommend more frequent screening and Pap tests.  The above recommendations are the same for women who have or have not gotten the vaccine for human papillomavirus (HPV).  If you had a hysterectomy for a problem that was not cancer or a condition that could lead to cancer, then you no longer need Pap tests. Even if you no longer need a Pap test, a regular exam is a good idea to make sure no other problems are  starting.  If you are between ages 65 and 70, and you have had normal Pap tests going back 10 years, you no longer need Pap tests. Even if you no longer need a Pap test, a regular exam is a good idea to make sure no other problems are starting.  If you have had past treatment for cervical cancer or a condition that could lead to cancer, you need Pap tests and screening for cancer for at least 20 years after your treatment.  If Pap tests have been discontinued, risk factors (such as a new sexual partner) need to be reassessed to determine if screening should be resumed.  The HPV test is an additional test that may be used for cervical cancer screening. The HPV test looks for the virus that can cause the cell changes on the cervix. The cells collected during the Pap test can be tested for HPV. The HPV test could be used to screen women aged 30 years and older, and should   be used in women of any age who have unclear Pap test results. After the age of 30, women should have HPV testing at the same frequency as a Pap test.  Colorectal cancer can be detected and often prevented. Most routine colorectal cancer screening begins at the age of 50 and continues through age 75. However, your caregiver may recommend screening at an earlier age if you have risk factors for colon cancer. On a yearly basis, your caregiver may provide home test kits to check for hidden blood in the stool. Use of a small camera at the end of a tube, to directly examine the colon (sigmoidoscopy or colonoscopy), can detect the earliest forms of colorectal cancer. Talk to your caregiver about this at age 50, when routine screening begins. Direct examination of the colon should be repeated every 5 to 10 years through age 75, unless early forms of pre-cancerous polyps or small growths are found.  Hepatitis C blood testing is recommended for all people born from 1945 through 1965 and any individual with known risks for hepatitis C.  Practice  safe sex. Use condoms and avoid high-risk sexual practices to reduce the spread of sexually transmitted infections (STIs). STIs include gonorrhea, chlamydia, syphilis, trichomonas, herpes, HPV, and human immunodeficiency virus (HIV). Herpes, HIV, and HPV are viral illnesses that have no cure. They can result in disability, cancer, and death. Sexually active women aged 25 and younger should be checked for chlamydia. Older women with new or multiple partners should also be tested for chlamydia. Testing for other STIs is recommended if you are sexually active and at increased risk.  Osteoporosis is a disease in which the bones lose minerals and strength with aging. This can result in serious bone fractures. The risk of osteoporosis can be identified using a bone density scan. Women ages 65 and over and women at risk for fractures or osteoporosis should discuss screening with their caregivers. Ask your caregiver whether you should take a calcium supplement or vitamin D to reduce the rate of osteoporosis.  Menopause can be associated with physical symptoms and risks. Hormone replacement therapy is available to decrease symptoms and risks. You should talk to your caregiver about whether hormone replacement therapy is right for you.  Use sunscreen with sun protection factor (SPF) of 30 or more. Apply sunscreen liberally and repeatedly throughout the day. You should seek shade when your shadow is shorter than you. Protect yourself by wearing long sleeves, pants, a wide-brimmed hat, and sunglasses year round, whenever you are outdoors.  Once a month, do a whole body skin exam, using a mirror to look at the skin on your back. Notify your caregiver of new moles, moles that have irregular borders, moles that are larger than a pencil eraser, or moles that have changed in shape or color.  Stay current with required immunizations.  Influenza. You need a dose every fall (or winter). The composition of the flu vaccine  changes each year, so being vaccinated once is not enough.  Pneumococcal polysaccharide. You need 1 to 2 doses if you smoke cigarettes or if you have certain chronic medical conditions. You need 1 dose at age 65 (or older) if you have never been vaccinated.  Tetanus, diphtheria, pertussis (Tdap, Td). Get 1 dose of Tdap vaccine if you are younger than age 65, are over 65 and have contact with an infant, are a healthcare worker, are pregnant, or simply want to be protected from whooping cough. After that, you need a Td   booster dose every 10 years. Consult your caregiver if you have not had at least 3 tetanus and diphtheria-containing shots sometime in your life or have a deep or dirty wound.  HPV. You need this vaccine if you are a woman age 26 or younger. The vaccine is given in 3 doses over 6 months.  Measles, mumps, rubella (MMR). You need at least 1 dose of MMR if you were born in 1957 or later. You may also need a second dose.  Meningococcal. If you are age 19 to 21 and a first-year college student living in a residence hall, or have one of several medical conditions, you need to get vaccinated against meningococcal disease. You may also need additional booster doses.  Zoster (shingles). If you are age 60 or older, you should get this vaccine.  Varicella (chickenpox). If you have never had chickenpox or you were vaccinated but received only 1 dose, talk to your caregiver to find out if you need this vaccine.  Hepatitis A. You need this vaccine if you have a specific risk factor for hepatitis A virus infection or you simply wish to be protected from this disease. The vaccine is usually given as 2 doses, 6 to 18 months apart.  Hepatitis B. You need this vaccine if you have a specific risk factor for hepatitis B virus infection or you simply wish to be protected from this disease. The vaccine is given in 3 doses, usually over 6 months. Preventive Services / Frequency Ages 19 to 39  Blood  pressure check.** / Every 1 to 2 years.  Lipid and cholesterol check.** / Every 5 years beginning at age 20.  Clinical breast exam.** / Every 3 years for women in their 20s and 30s.  Pap test.** / Every 2 years from ages 21 through 29. Every 3 years starting at age 30 through age 65 or 70 with a history of 3 consecutive normal Pap tests.  HPV screening.** / Every 3 years from ages 30 through ages 65 to 70 with a history of 3 consecutive normal Pap tests.  Hepatitis C blood test.** / For any individual with known risks for hepatitis C.  Skin self-exam. / Monthly.  Influenza immunization.** / Every year.  Pneumococcal polysaccharide immunization.** / 1 to 2 doses if you smoke cigarettes or if you have certain chronic medical conditions.  Tetanus, diphtheria, pertussis (Tdap, Td) immunization. / A one-time dose of Tdap vaccine. After that, you need a Td booster dose every 10 years.  HPV immunization. / 3 doses over 6 months, if you are 26 and younger.  Measles, mumps, rubella (MMR) immunization. / You need at least 1 dose of MMR if you were born in 1957 or later. You may also need a second dose.  Meningococcal immunization. / 1 dose if you are age 19 to 21 and a first-year college student living in a residence hall, or have one of several medical conditions, you need to get vaccinated against meningococcal disease. You may also need additional booster doses.  Varicella immunization.** / Consult your caregiver.  Hepatitis A immunization.** / Consult your caregiver. 2 doses, 6 to 18 months apart.  Hepatitis B immunization.** / Consult your caregiver. 3 doses usually over 6 months. Ages 40 to 64  Blood pressure check.** / Every 1 to 2 years.  Lipid and cholesterol check.** / Every 5 years beginning at age 20.  Clinical breast exam.** / Every year after age 40.  Mammogram.** / Every year beginning at age 40   and continuing for as long as you are in good health. Consult with your  caregiver.  Pap test.** / Every 3 years starting at age 30 through age 65 or 70 with a history of 3 consecutive normal Pap tests.  HPV screening.** / Every 3 years from ages 30 through ages 65 to 70 with a history of 3 consecutive normal Pap tests.  Fecal occult blood test (FOBT) of stool. / Every year beginning at age 50 and continuing until age 75. You may not need to do this test if you get a colonoscopy every 10 years.  Flexible sigmoidoscopy or colonoscopy.** / Every 5 years for a flexible sigmoidoscopy or every 10 years for a colonoscopy beginning at age 50 and continuing until age 75.  Hepatitis C blood test.** / For all people born from 1945 through 1965 and any individual with known risks for hepatitis C.  Skin self-exam. / Monthly.  Influenza immunization.** / Every year.  Pneumococcal polysaccharide immunization.** / 1 to 2 doses if you smoke cigarettes or if you have certain chronic medical conditions.  Tetanus, diphtheria, pertussis (Tdap, Td) immunization.** / A one-time dose of Tdap vaccine. After that, you need a Td booster dose every 10 years.  Measles, mumps, rubella (MMR) immunization. / You need at least 1 dose of MMR if you were born in 1957 or later. You may also need a second dose.  Varicella immunization.** / Consult your caregiver.  Meningococcal immunization.** / Consult your caregiver.  Hepatitis A immunization.** / Consult your caregiver. 2 doses, 6 to 18 months apart.  Hepatitis B immunization.** / Consult your caregiver. 3 doses, usually over 6 months. Ages 65 and over  Blood pressure check.** / Every 1 to 2 years.  Lipid and cholesterol check.** / Every 5 years beginning at age 20.  Clinical breast exam.** / Every year after age 40.  Mammogram.** / Every year beginning at age 40 and continuing for as long as you are in good health. Consult with your caregiver.  Pap test.** / Every 3 years starting at age 30 through age 65 or 70 with a 3  consecutive normal Pap tests. Testing can be stopped between 65 and 70 with 3 consecutive normal Pap tests and no abnormal Pap or HPV tests in the past 10 years.  HPV screening.** / Every 3 years from ages 30 through ages 65 or 70 with a history of 3 consecutive normal Pap tests. Testing can be stopped between 65 and 70 with 3 consecutive normal Pap tests and no abnormal Pap or HPV tests in the past 10 years.  Fecal occult blood test (FOBT) of stool. / Every year beginning at age 50 and continuing until age 75. You may not need to do this test if you get a colonoscopy every 10 years.  Flexible sigmoidoscopy or colonoscopy.** / Every 5 years for a flexible sigmoidoscopy or every 10 years for a colonoscopy beginning at age 50 and continuing until age 75.  Hepatitis C blood test.** / For all people born from 1945 through 1965 and any individual with known risks for hepatitis C.  Osteoporosis screening.** / A one-time screening for women ages 65 and over and women at risk for fractures or osteoporosis.  Skin self-exam. / Monthly.  Influenza immunization.** / Every year.  Pneumococcal polysaccharide immunization.** / 1 dose at age 65 (or older) if you have never been vaccinated.  Tetanus, diphtheria, pertussis (Tdap, Td) immunization. / A one-time dose of Tdap vaccine if you are over   65 and have contact with an infant, are a healthcare worker, or simply want to be protected from whooping cough. After that, you need a Td booster dose every 10 years.  Varicella immunization.** / Consult your caregiver.  Meningococcal immunization.** / Consult your caregiver.  Hepatitis A immunization.** / Consult your caregiver. 2 doses, 6 to 18 months apart.  Hepatitis B immunization.** / Check with your caregiver. 3 doses, usually over 6 months. ** Family history and personal history of risk and conditions may change your caregiver's recommendations. Document Released: 05/04/2001 Document Revised: 05/31/2011  Document Reviewed: 08/03/2010 [employer] Patient Information 2013 [employer].  

## 2012-01-28 NOTE — Assessment & Plan Note (Signed)
Tries to eat a heart healthy diet and avoid trans fats. Exercises, encouraged to add MegaRed krill oil caps and recheck lipid panel, fasting next week.

## 2012-02-01 ENCOUNTER — Telehealth: Payer: Self-pay

## 2012-02-01 NOTE — Telephone Encounter (Signed)
Message copied by Court Joy on Tue Feb 01, 2012  3:54 PM ------      Message from: Danise Edge A      Created: Fri Jan 28, 2012  3:47 PM       Please call and let her know I looked up the antihistamines after she left and confirmed that Allegra and Claritin are listed as the safest for breast feeding

## 2012-02-01 NOTE — Telephone Encounter (Signed)
Patient informed and voiced understanding

## 2012-02-01 NOTE — Telephone Encounter (Signed)
Message copied by Court Joy on Tue Feb 01, 2012  3:55 PM ------      Message from: Danise Edge A      Created: Fri Jan 28, 2012  3:47 PM       Please call and let her know I looked up the antihistamines after she left and confirmed that Allegra and Claritin are listed as the safest for breast feeding

## 2012-02-03 ENCOUNTER — Other Ambulatory Visit (INDEPENDENT_AMBULATORY_CARE_PROVIDER_SITE_OTHER): Payer: 59

## 2012-02-03 DIAGNOSIS — E785 Hyperlipidemia, unspecified: Secondary | ICD-10-CM

## 2012-02-03 DIAGNOSIS — Z0000 Encounter for general adult medical examination without abnormal findings: Secondary | ICD-10-CM

## 2012-02-03 LAB — RENAL FUNCTION PANEL
Albumin: 4.2 g/dL (ref 3.5–5.2)
Glucose, Bld: 92 mg/dL (ref 70–99)
Phosphorus: 4.1 mg/dL (ref 2.3–4.6)
Potassium: 4.1 mEq/L (ref 3.5–5.1)
Sodium: 139 mEq/L (ref 135–145)

## 2012-02-03 LAB — HEPATIC FUNCTION PANEL
Bilirubin, Direct: 0 mg/dL (ref 0.0–0.3)
Total Bilirubin: 0.5 mg/dL (ref 0.3–1.2)
Total Protein: 6.8 g/dL (ref 6.0–8.3)

## 2012-02-03 LAB — LIPID PANEL
Cholesterol: 242 mg/dL — ABNORMAL HIGH (ref 0–200)
Total CHOL/HDL Ratio: 3
Triglycerides: 54 mg/dL (ref 0.0–149.0)

## 2012-02-03 LAB — CBC
MCHC: 32.9 g/dL (ref 30.0–36.0)
MCV: 87.4 fl (ref 78.0–100.0)

## 2012-03-14 ENCOUNTER — Telehealth: Payer: Self-pay

## 2012-03-14 MED ORDER — AZITHROMYCIN 250 MG PO TABS: 250.0000 mg | ORAL_TABLET | Freq: Every day | ORAL | Status: DC

## 2012-03-14 NOTE — Telephone Encounter (Signed)
Patient called Call a Nurse yesterday stating that she has a cough/congestion/ temp of 99.4. Pt states was worse yesterday (03-12-12) was aching and having chills, chills are gone today/ pt has taken Tylenol/ home care advice given/ all emergent sx ruled out per cough protocol.  Call was taken by Sula Rumple

## 2012-03-14 NOTE — Telephone Encounter (Signed)
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 Per MD don't take the Mucinex then. Pt voiced understanding and RX sent to pharmacy

## 2012-03-14 NOTE — Telephone Encounter (Signed)
Can have Azithromycin 250 mg 2 tabs po once and then 1 tab po daily x 4 days. Probiotics and mucinex x next 10 days

## 2012-05-07 ENCOUNTER — Other Ambulatory Visit: Payer: Self-pay

## 2012-09-07 ENCOUNTER — Ambulatory Visit: Payer: 59 | Admitting: Family Medicine

## 2012-09-21 ENCOUNTER — Ambulatory Visit: Payer: 59 | Admitting: Family Medicine

## 2012-09-21 ENCOUNTER — Ambulatory Visit (INDEPENDENT_AMBULATORY_CARE_PROVIDER_SITE_OTHER): Payer: 59 | Admitting: Family Medicine

## 2012-09-21 ENCOUNTER — Encounter: Payer: Self-pay | Admitting: Family Medicine

## 2012-09-21 VITALS — BP 102/70 | HR 75 | Temp 97.9°F | Resp 16 | Ht 59.0 in | Wt 121.0 lb

## 2012-09-21 DIAGNOSIS — R5383 Other fatigue: Secondary | ICD-10-CM

## 2012-09-21 DIAGNOSIS — B079 Viral wart, unspecified: Secondary | ICD-10-CM

## 2012-09-21 DIAGNOSIS — R5381 Other malaise: Secondary | ICD-10-CM

## 2012-09-21 DIAGNOSIS — E785 Hyperlipidemia, unspecified: Secondary | ICD-10-CM

## 2012-09-21 LAB — CBC
HCT: 38.9 % (ref 36.0–46.0)
Platelets: 330 10*3/uL (ref 150–400)
RDW: 13.2 % (ref 11.5–15.5)
WBC: 8.6 10*3/uL (ref 4.0–10.5)

## 2012-09-21 NOTE — Assessment & Plan Note (Addendum)
Check lipids and avoid trans fats, patient never started [employer] will consider after labs available

## 2012-09-21 NOTE — Assessment & Plan Note (Signed)
Cryotherapy applied to 3 lesions on right shoulder

## 2012-09-21 NOTE — Patient Instructions (Addendum)
Next appt prior to visit lipid, renal, cbc, tsh, hepatic at [facility] Next visit annual   Cholesterol Cholesterol is a white, waxy, fat-like protein needed by your body in small amounts. The liver makes all the cholesterol you need. It is carried from the liver by the blood through the blood vessels. Deposits (plaque) may build up on blood vessel walls. This makes the arteries narrower and stiffer. Plaque increases the risk for heart attack and stroke. You cannot feel your cholesterol level even if it is very high. The only way to know is by a blood test to check your lipid (fats) levels. Once you know your cholesterol levels, you should keep a record of the test results. Work with your caregiver to to keep your levels in the desired range. WHAT THE RESULTS MEAN:  Total cholesterol is a rough measure of all the cholesterol in your blood.  LDL is the so-called bad cholesterol. This is the type that deposits cholesterol in the walls of the arteries. You want this level to be low.  HDL is the good cholesterol because it cleans the arteries and carries the LDL away. You want this level to be high.  Triglycerides are fat that the body can either burn for energy or store. High levels are closely linked to heart disease. DESIRED LEVELS:  Total cholesterol below 200.  LDL below 100 for people at risk, below 70 for very high risk.  HDL above 50 is good, above 60 is best.  Triglycerides below 150. HOW TO LOWER YOUR CHOLESTEROL:  Diet.  Choose fish or white meat chicken and [place], roasted or baked. Limit fatty cuts of red meat, fried foods, and processed meats, such as sausage and lunch meat.  Eat lots of fresh fruits and vegetables. Choose whole grains, beans, pasta, potatoes and cereals.  Use only small amounts of olive, corn or canola oils. Avoid butter, mayonnaise, shortening or palm kernel oils. Avoid foods with trans-fats.  Use skim/nonfat milk and low-fat/nonfat yogurt and cheeses.  Avoid whole milk, cream, ice cream, egg yolks and cheeses. Healthy desserts include angel food cake, ginger snaps, animal crackers, hard candy, popsicles, and low-fat/nonfat frozen yogurt. Avoid pastries, cakes, pies and cookies.  Exercise.  A regular program helps decrease LDL and raises HDL.  Helps with weight control.  Do things that increase your activity level like gardening, walking, or taking the stairs.  Medication.  May be prescribed by your caregiver to help lowering cholesterol and the risk for heart disease.  You may need medicine even if your levels are normal if you have several risk factors. HOME CARE INSTRUCTIONS   Follow your diet and exercise programs as suggested by your caregiver.  Take medications as directed.  Have blood work done when your caregiver feels it is necessary. MAKE SURE YOU:   Understand these instructions.  Will watch your condition.  Will get help right away if you are not doing well or get worse. Document Released: 12/01/2000 Document Revised: 05/31/2011 Document Reviewed: 05/24/2007 [facility] Patient Information 2014 [place], [place].

## 2012-09-21 NOTE — Progress Notes (Signed)
Patient ID: Stacy Dennis, female   DOB: Nov 10, 1980, 32 y.o.   MRN: [id number] Stacy Dennis [id number] 05-09-1980 09/21/2012      Progress Note-Follow Up  Subjective  Chief Complaint  Chief Complaint  Patient presents with  . skin lesion    Pt here for skin lesions on her right shoulder. Had them frozen previously.    HPI  Patient is a 32 year old Caucasian female who is here today in followup. She is still not taking her Krill oil but has been trying to maintain a heart healthy diet. She's been avoiding trans fats and fried foods. . She's been exercising more and is hoping that her cholesterol is improved. She did have a recent episode of GI upset and diarrhea but that has resolved. She denies any fevers and chills with this episode she denies any vomiting with this episode. Otherwise she feels well. She continues to nurse her young son. No chest pain, palpitations, fevers, GI or GU complaints  Past Medical History  Diagnosis Date  . Pregnancy related carpal tunnel syndrome, antepartum   . Endometriosis   . H/O varicella   . Hypercholesteremia     no meds  . Verrucous skin lesion 01/28/2012  . Hyperlipidemia 01/28/2012  . Preventative health care 01/28/2012  . Allergic state 01/28/2012    Past Surgical History  Procedure Laterality Date  . Eye surgery  2012    lasik  . Myringotomy    . Cesarean section  10-10-11    Family History  Problem Relation Age of Onset  . Anxiety disorder Mother   . Hyperlipidemia Mother   . Hypertension Mother   . Irritable bowel syndrome Mother     ?  . Kidney disease Father     stones  . Hyperlipidemia Father   . Thyroid disease Maternal Aunt   . Cancer Paternal Grandfather     lung- smoker  . Stroke Paternal Grandfather   . Aneurysm Paternal Grandfather   . Crohn's disease Paternal Aunt   . Hyperlipidemia Paternal Grandmother   . Hypertension Maternal Grandmother   . Heart disease Maternal Grandfather     History   Social  History  . Marital Status: Married    Spouse Name: N/A    Number of Children: N/A  . Years of Education: N/A   Occupational History  . Not on file.   Social History Main Topics  . Smoking status: Never Smoker   . Smokeless tobacco: Never Used  . Alcohol Use: Yes     Comment: socially  . Drug Use: No  . Sexually Active: Yes -- Female partner(s)   Other Topics Concern  . Not on file   Social History Narrative  . No narrative on file    Current Outpatient Prescriptions on File Prior to Visit  Medication Sig Dispense Refill  . Prenatal Vit-Fe Fumarate-FA (PRENATAL MULTIVITAMIN) TABS Take 1 tablet by mouth daily.      Boris Lown Oil CAPS MegaRed cap daily krill oil by Schiff       No current facility-administered medications on file prior to visit.    Allergies  Allergen Reactions  . Amoxicillin Nausea And Vomiting  . Lactose Intolerance (Gi)     Review of Systems  Review of Systems  Constitutional: Negative for fever and malaise/fatigue.  HENT: Negative for congestion.   Eyes: Negative for pain and discharge.  Respiratory: Negative for shortness of breath.   Cardiovascular: Negative for chest pain, palpitations and leg swelling.  Gastrointestinal:  Negative for nausea, abdominal pain and diarrhea.  Genitourinary: Negative for dysuria.  Musculoskeletal: Negative for falls.  Skin: Negative for rash.  Neurological: Negative for loss of consciousness and headaches.  Endo/Heme/Allergies: Negative for polydipsia.  Psychiatric/Behavioral: Negative for depression and suicidal ideas. The patient is not nervous/anxious and does not have insomnia.     Objective  BP 102/70  Pulse 75  Temp(Src) 97.9 F (36.6 C) (Oral)  Resp 16  Ht 4\' 11"  (1.499 m)  Wt 121 lb (54.885 kg)  BMI 24.43 kg/m2  SpO2 97%  Physical Exam  Physical Exam  Constitutional: She is oriented to person, place, and time and well-developed, well-nourished, and in no distress. No distress.  HENT:  Head:  Normocephalic and atraumatic.  Eyes: Conjunctivae are normal.  Neck: Neck supple. No thyromegaly present.  Cardiovascular: Normal rate, regular rhythm and normal heart sounds.   No murmur heard. Pulmonary/Chest: Effort normal and breath sounds normal. She has no wheezes.  Abdominal: She exhibits no distension and no mass.  Musculoskeletal: She exhibits no edema.  Lymphadenopathy:    She has no cervical adenopathy.  Neurological: She is alert and oriented to person, place, and time.  Skin: Skin is warm and dry. No rash noted. She is not diaphoretic.  3 small, verrucous lesions on right shoulder  Psychiatric: Memory, affect and judgment normal.    Lab Results  Component Value Date   TSH 0.92 02/03/2012   Lab Results  Component Value Date   WBC 5.4 02/03/2012   HGB 12.7 02/03/2012   HCT 38.6 02/03/2012   MCV 87.4 02/03/2012   PLT 277.0 02/03/2012   Lab Results  Component Value Date   CREATININE 0.6 02/03/2012   BUN 14 02/03/2012   NA 139 02/03/2012   K 4.1 02/03/2012   CL 108 02/03/2012   CO2 24 02/03/2012   Lab Results  Component Value Date   ALT 28 02/03/2012   AST 28 02/03/2012   ALKPHOS 72 02/03/2012   BILITOT 0.5 02/03/2012   Lab Results  Component Value Date   CHOL 242* 02/03/2012   Lab Results  Component Value Date   HDL 75.60 02/03/2012   No results found for this basename: [facility]   Lab Results  Component Value Date   TRIG 54.0 02/03/2012   Lab Results  Component Value Date   CHOLHDL 3 02/03/2012     Assessment & Plan  Hyperlipidemia Check lipids and avoid trans fats, patient never started [employer] will consider after labs available  Verrucous skin lesion Cryotherapy applied to 3 lesions on right shoulder

## 2012-09-22 LAB — LIPID PANEL
HDL: 67 mg/dL (ref >39)
LDL Cholesterol: 116 mg/dL — ABNORMAL HIGH (ref 0–99)
VLDL: 15 mg/dL (ref 0–40)

## 2012-09-22 LAB — HEPATIC FUNCTION PANEL
ALT: 18 U/L (ref 0–35)
Bilirubin, Direct: 0.1 mg/dL (ref 0.0–0.3)
Indirect Bilirubin: 0.5 mg/dL (ref 0.0–0.9)
Total Bilirubin: 0.6 mg/dL (ref 0.3–1.2)

## 2012-10-05 ENCOUNTER — Encounter: Payer: Self-pay | Admitting: Family Medicine

## 2012-10-13 ENCOUNTER — Ambulatory Visit: Payer: 59 | Admitting: Family Medicine

## 2013-01-02 ENCOUNTER — Ambulatory Visit (INDEPENDENT_AMBULATORY_CARE_PROVIDER_SITE_OTHER): Payer: 59 | Admitting: Sports Medicine

## 2013-01-02 ENCOUNTER — Encounter: Payer: Self-pay | Admitting: Sports Medicine

## 2013-01-02 VITALS — BP 114/75 | HR 82 | Ht 59.5 in | Wt 121.0 lb

## 2013-01-02 DIAGNOSIS — M205X9 Other deformities of toe(s) (acquired), unspecified foot: Secondary | ICD-10-CM

## 2013-01-02 DIAGNOSIS — R269 Unspecified abnormalities of gait and mobility: Secondary | ICD-10-CM

## 2013-01-02 DIAGNOSIS — M2140 Flat foot [pes planus] (acquired), unspecified foot: Secondary | ICD-10-CM

## 2013-01-02 DIAGNOSIS — M2141 Flat foot [pes planus] (acquired), right foot: Secondary | ICD-10-CM | POA: Insufficient documentation

## 2013-01-02 DIAGNOSIS — M2142 Flat foot [pes planus] (acquired), left foot: Secondary | ICD-10-CM | POA: Insufficient documentation

## 2013-01-02 NOTE — Assessment & Plan Note (Signed)
Pt lower extremity exam depicts calcaneal valgus at the subtalar joint, collapse of the medial longitudinal arch with pes planus B/L, and slight hallux limitus along with elevation of her 5th MTP joints B/L.  She is not having significant pain, except for jumping exercises.  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  She may benefit from custom orthotics, which we will try today.  F/U PRN or if she needs adjustments or her orthotics.   Patient was fitted for a : custom orthotic  The orthotic was heated and afterward the patient stood on the orthotic blank positioned on the orthotic stand. The patient was positioned in subtalar neutral position and 10 degrees of ankle dorsiflexion in a weight bearing stance. After completion of molding, a stable base was applied to the orthotic blank. The blank was ground to a stable position for weight bearing. Size: 6 Base: Blue EVA Posting: Blue foam on heel Additional orthotic padding: None

## 2013-01-02 NOTE — Assessment & Plan Note (Signed)
Pt with loss of her medial longitudinal arch with slight collapse of the transverse arch B/L

## 2013-01-02 NOTE — Assessment & Plan Note (Addendum)
Pt with about 30 degrees of toe extension B/L and 30-45 degrees of toe flexion B/L.    We will follow to see if this helps relieve hte sxs adequately and if motion improves  Can [place] if not

## 2013-01-02 NOTE — Progress Notes (Signed)
Stacy Dennis is a 32 y.o. who presents today for orthotic evaluation.  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  She had trouble in her hindfoot at that time, requiring her to switch from a spike shoe to a turf shoe.  She has tried multiple shoes over the past several yrs and noticed the Stacy Dennis has been the most comfortable for her.  She does not have specific pain with walking or running, but does have some sharp midfoot pain if she does repetitive jumping motions.  She also noticed increase edema and discomfort in her feet when she was pregnant last year.  Otherwise, she denies pain, numbness, tingling, burning, previous injury to either foot or ankle. She works as a PT at [employer], and is consitenly on her feet throughout the day, not c/o pain at the end of the day in her feet.   Past Medical History  Diagnosis Date  . Pregnancy related carpal tunnel syndrome, antepartum   . Endometriosis   . H/O varicella   . Hypercholesteremia     no meds  . Verrucous skin lesion 01/28/2012  . Hyperlipidemia 01/28/2012  . Preventative health care 01/28/2012  . Allergic state 01/28/2012    History   Social History  . Marital Status: Married    Spouse Name: N/A    Number of Children: N/A  . Years of Education: N/A   Occupational History  . Not on file.   Social History Main Topics  . Smoking status: Never Smoker   . Smokeless tobacco: Never Used  . Alcohol Use: Yes     Comment: socially  . Drug Use: No  . Sexual Activity: Yes    Partners: Male   Other Topics Concern  . Not on file   Social History Narrative  . No narrative on file    Family History  Problem Relation Age of Onset  . Anxiety disorder Mother   . Hyperlipidemia Mother   . Hypertension Mother   . Irritable bowel syndrome Mother     ?  . Kidney disease Father     stones  . Hyperlipidemia Father   . Thyroid disease  Maternal Aunt   . Cancer Paternal Grandfather     lung- smoker  . Stroke Paternal Grandfather   . Aneurysm Paternal Grandfather   . Crohn's disease Paternal Aunt   . Hyperlipidemia Paternal Grandmother   . Hypertension Maternal Grandmother   . Heart disease Maternal Grandfather     Current Outpatient Prescriptions on File Prior to Visit  Medication Sig Dispense Refill  . Calcium-Vitamin D 600-200 MG-UNIT per tablet Take 1 tablet by mouth daily.      . Prenatal Vit-Fe Fumarate-FA (PRENATAL MULTIVITAMIN) TABS Take 1 tablet by mouth daily.      . vitamin C (ASCORBIC ACID) 500 MG tablet Take 500 mg by mouth daily.       No current facility-administered medications on file prior to visit.    Patient Information Form: Screening and ROS   Review of Symptoms 11 point ROS is negative  Physical Exam Filed Vitals:   01/02/13 1357  BP: 114/75  Pulse: 82    Gen: NAD, Well nourished, Well developed HEENT:  EOMI, [place]/AT Neck: no JVD Cardio: RRR, No murmurs/gallops/rubs Lungs: CTA, no wheezes, rhonchi, crackles Neuro: MS 5/5 B/L UE and LE, +2 patellar and achilles relfex b/l  Psych: AAO x 3  Ankle/Foot Exam: Calcaneal Valgus B/L with collapse of the medial longitudinal arch with pes planus present.   No visible erythema or swelling. Range of motion is full in all directions of her ankle joint  Strength is 5/5 in all directions. Stable lateral and medial ligaments Talar dome nontender No tenderness on posterior aspects of lateral and medial malleolus 1st MTP non tender, slight valgus deformity (<10 degrees B/L), ROM limited to 30 degrees of flexion/extension B/L.

## 2013-01-02 NOTE — Patient Instructions (Signed)

## 2013-01-25 ENCOUNTER — Other Ambulatory Visit: Payer: Self-pay

## 2013-03-27 ENCOUNTER — Ambulatory Visit (INDEPENDENT_AMBULATORY_CARE_PROVIDER_SITE_OTHER): Payer: 59 | Admitting: Sports Medicine

## 2013-03-27 ENCOUNTER — Encounter: Payer: Self-pay | Admitting: Sports Medicine

## 2013-03-27 VITALS — BP 122/85 | Ht 59.5 in | Wt 125.0 lb

## 2013-03-27 DIAGNOSIS — R269 Unspecified abnormalities of gait and mobility: Secondary | ICD-10-CM

## 2013-03-28 NOTE — Assessment & Plan Note (Signed)
Patient was fitted for a : standard, cushioned, semi-rigid orthotic. The orthotic was heated and afterward the patient stood on the orthotic blank positioned on the orthotic stand. The patient was positioned in subtalar neutral position and 10 degrees of ankle dorsiflexion in a weight bearing stance. After completion of molding, a stable base was applied to the orthotic blank. The blank was ground to a stable position for weight bearing. Size: 6 thin dress blank Base: red brick EVA Posting: none Additional orthotic padding: none  Preparaiton time was 40 mins.  Patient is to try this new or top of orthotic in other forms of shoes This did seem to give her about 90% correction of her pronation of gait  If this is tolerated I would like her to use orthotics as much as possible  Dir. evaluation and for preparation orthotics was 42 minutes

## 2013-03-28 NOTE — Progress Notes (Signed)
Patient ID: Stacy Dennis, female   DOB: 08/20/1980, 33 y.o.   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[employer] In October we saw her with significant foot issues and an abnormally pronated gait We placed her in custom orthotics and these have helped significantly  She is using these in sports shoes and 4 activity  She returns today to see if we can make additional pairs that would work in other types of shoes that she is to wear for work  Physical examination  No acute distress BP 122/85  Ht 4' 11.5" (1.511 m)  Wt 125 lb (56.7 kg)  BMI 24.83 kg/m2  As noted before the patient has significant pes planus with an abnormal standing posture She has also the longitudinal arch She also has hallux limitus This gives her an abnormally pronated gait The gait creates some element of genu valgus

## 2013-08-17 ENCOUNTER — Encounter: Payer: Self-pay | Admitting: Physician Assistant

## 2013-08-17 ENCOUNTER — Ambulatory Visit (INDEPENDENT_AMBULATORY_CARE_PROVIDER_SITE_OTHER): Payer: 59 | Admitting: Physician Assistant

## 2013-08-17 VITALS — BP 108/68 | HR 100 | Temp 98.3°F | Resp 16 | Ht 59.5 in | Wt 146.2 lb

## 2013-08-17 DIAGNOSIS — J329 Chronic sinusitis, unspecified: Secondary | ICD-10-CM

## 2013-08-17 MED ORDER — AZITHROMYCIN 250 MG PO TABS
ORAL_TABLET | ORAL | Status: DC
Start: 2013-08-17 — End: 2013-08-29

## 2013-08-17 NOTE — Progress Notes (Signed)
Pre visit review using our clinic review tool, if applicable. No additional management support is needed unless otherwise documented below in the visit note/SLS  

## 2013-08-17 NOTE — Progress Notes (Signed)
Patient presents to clinic today c/o greater than one week of sinus pressure, sinus pain, tooth pain, postnasal drip, scratchy throat and nonproductive cough. Patient denies fever, chills, shortness of breath, wheezing, or pleuritic chest pain. Denies recent travel or sick contact. Patient is currently pregnant and is in her second trimester.  Past Medical History  Diagnosis Date  . Pregnancy related carpal tunnel syndrome, antepartum   . Endometriosis   . H/O varicella   . Hypercholesteremia     no meds  . Verrucous skin lesion 01/28/2012  . Hyperlipidemia 01/28/2012  . Preventative health care 01/28/2012  . Allergic state 01/28/2012    Current Outpatient Prescriptions on File Prior to Visit  Medication Sig Dispense Refill  . Calcium-Vitamin D 600-200 MG-UNIT per tablet Take 1 tablet by mouth daily.      . Prenatal Vit-Fe Fumarate-FA (PRENATAL MULTIVITAMIN) TABS Take 1 tablet by mouth daily.      . vitamin C (ASCORBIC ACID) 500 MG tablet Take 500 mg by mouth daily.       No current facility-administered medications on file prior to visit.    Allergies  Allergen Reactions  . Amoxicillin Nausea And Vomiting  . Lactose Intolerance (Gi)     Family History  Problem Relation Age of Onset  . Anxiety disorder Mother   . Hyperlipidemia Mother   . Hypertension Mother   . Irritable bowel syndrome Mother     ?  . Kidney disease Father     stones  . Hyperlipidemia Father   . Thyroid disease Maternal Aunt   . Cancer Paternal Grandfather     lung- smoker  . Stroke Paternal Grandfather   . Aneurysm Paternal Grandfather   . Crohn's disease Paternal Aunt   . Hyperlipidemia Paternal Grandmother   . Hypertension Maternal Grandmother   . Heart disease Maternal Grandfather     History   Social History  . Marital Status: Married    Spouse Name: N/A    Number of Children: N/A  . Years of Education: N/A   Social History Main Topics  . Smoking status: Never Smoker   . Smokeless  tobacco: Never Used  . Alcohol Use: Yes     Comment: socially  . Drug Use: No  . Sexual Activity: Yes    Partners: Male   Other Topics Concern  . None   Social History Narrative  . None   Review of Systems - See HPI.  All other ROS are negative.  BP 108/68  Pulse 100  Temp(Src) 98.3 F (36.8 C) (Oral)  Resp 16  Ht 4' 11.5" (1.511 m)  Wt 146 lb 4 oz (66.339 kg)  BMI 29.06 kg/m2  SpO2 98%  Physical Exam  Vitals reviewed. Constitutional: She is oriented to person, place, and time and well-developed, well-nourished, and in no distress.  HENT:  Head: Normocephalic and atraumatic.  Right Ear: External ear normal.  Left Ear: External ear normal.  Nose: Nose normal.  Mouth/Throat: Oropharynx is clear and moist. No oropharyngeal exudate.  Tympanic membranes within normal limits bilaterally. Positive tenderness to percussion of sinuses noted.  Eyes: Conjunctivae are normal. Pupils are equal, round, and reactive to light.  Neck: Neck supple.  Cardiovascular: Normal rate, regular rhythm and intact distal pulses.   II/VI murmur, likely mammary souffle giving pregnancy status.  Pulmonary/Chest: Effort normal and breath sounds normal. No respiratory distress. She has no wheezes. She has no rales. She exhibits no tenderness.  Lymphadenopathy:    She has no cervical  adenopathy.  Neurological: She is alert and oriented to person, place, and time.  Skin: Skin is warm and dry. No rash noted.  Psychiatric: Affect normal.    No results found for this or any previous visit (from the past 2160 hour(s)).  Assessment/Plan: Sinusitis Azithromycin is pregnancy category B. Will Rx azithromycin. Increase fluid intake. Rest. Saline nasal spray. Humidifier in bedroom. Continue prenatal vitamins. Warm tea for cough. Call or return to clinic if symptoms not improving.

## 2013-08-17 NOTE — Patient Instructions (Signed)
Please take antibiotic as directed.  Increase fluid intake.  Use Saline nasal spray or Use Neti Pot but make sure to use distilled water.  Take a daily multivitamin. Place a humidifier in the bedroom.  Please call or return clinic if symptoms are not improving.  Sinusitis Sinusitis is redness, soreness, and swelling (inflammation) of the paranasal sinuses. Paranasal sinuses are air pockets within the bones of your face (beneath the eyes, the middle of the forehead, or above the eyes). In healthy paranasal sinuses, mucus is able to drain out, and air is able to circulate through them by way of your nose. However, when your paranasal sinuses are inflamed, mucus and air can become trapped. This can allow bacteria and other germs to grow and cause infection. Sinusitis can develop quickly and last only a short time (acute) or continue over a long period (chronic). Sinusitis that lasts for more than 12 weeks is considered chronic.  CAUSES  Causes of sinusitis include:  Allergies.  Structural abnormalities, such as displacement of the cartilage that separates your nostrils (deviated septum), which can decrease the air flow through your nose and sinuses and affect sinus drainage.  Functional abnormalities, such as when the small hairs (cilia) that line your sinuses and help remove mucus do not work properly or are not present. SYMPTOMS  Symptoms of acute and chronic sinusitis are the same. The primary symptoms are pain and pressure around the affected sinuses. Other symptoms include:  Upper toothache.  Earache.  Headache.  Bad breath.  Decreased sense of smell and taste.  A cough, which worsens when you are lying flat.  Fatigue.  Fever.  Thick drainage from your nose, which often is green and may contain pus (purulent).  Swelling and warmth over the affected sinuses. DIAGNOSIS  Your caregiver will perform a physical exam. During the exam, your caregiver may:  Look in your nose for signs  of abnormal growths in your nostrils (nasal polyps).  Tap over the affected sinus to check for signs of infection.  View the inside of your sinuses (endoscopy) with a special imaging device with a light attached (endoscope), which is inserted into your sinuses. If your caregiver suspects that you have chronic sinusitis, one or more of the following tests may be recommended:  Allergy tests.  Nasal culture A sample of mucus is taken from your nose and sent to a lab and screened for bacteria.  Nasal cytology A sample of mucus is taken from your nose and examined by your caregiver to determine if your sinusitis is related to an allergy. TREATMENT  Most cases of acute sinusitis are related to a viral infection and will resolve on their own within 10 days. Sometimes medicines are prescribed to help relieve symptoms (pain medicine, decongestants, nasal steroid sprays, or saline sprays).  However, for sinusitis related to a bacterial infection, your caregiver will prescribe antibiotic medicines. These are medicines that will help kill the bacteria causing the infection.  Rarely, sinusitis is caused by a fungal infection. In theses cases, your caregiver will prescribe antifungal medicine. For some cases of chronic sinusitis, surgery is needed. Generally, these are cases in which sinusitis recurs more than 3 times per year, despite other treatments. HOME CARE INSTRUCTIONS   Drink plenty of water. Water helps thin the mucus so your sinuses can drain more easily.  Use a humidifier.  Inhale steam 3 to 4 times a day (for example, sit in the bathroom with the shower running).  Apply a warm, moist  washcloth to your face 3 to 4 times a day, or as directed by your caregiver.  Use saline nasal sprays to help moisten and clean your sinuses.  Take over-the-counter or prescription medicines for pain, discomfort, or fever only as directed by your caregiver. SEEK IMMEDIATE MEDICAL CARE IF:  You have  increasing pain or severe headaches.  You have nausea, vomiting, or drowsiness.  You have swelling around your face.  You have vision problems.  You have a stiff neck.  You have difficulty breathing. MAKE SURE YOU:   Understand these instructions.  Will watch your condition.  Will get help right away if you are not doing well or get worse. Document Released: 03/08/2005 Document Revised: 05/31/2011 Document Reviewed: 03/23/2011 [facility] Patient Information 2014 [place], [place].

## 2013-08-19 DIAGNOSIS — J329 Chronic sinusitis, unspecified: Secondary | ICD-10-CM | POA: Insufficient documentation

## 2013-08-19 NOTE — Assessment & Plan Note (Addendum)
Azithromycin is pregnancy category B. Will Rx azithromycin. Increase fluid intake. Rest. Saline nasal spray. Humidifier in bedroom. Continue prenatal vitamins. Warm tea for cough. Call or return to clinic if symptoms not improving.

## 2013-08-29 ENCOUNTER — Encounter: Payer: Self-pay | Admitting: Family Medicine

## 2013-08-29 ENCOUNTER — Ambulatory Visit (INDEPENDENT_AMBULATORY_CARE_PROVIDER_SITE_OTHER): Payer: 59 | Admitting: Family Medicine

## 2013-08-29 VITALS — BP 118/70 | HR 77 | Temp 97.9°F | Ht 59.5 in | Wt 147.0 lb

## 2013-08-29 DIAGNOSIS — L989 Disorder of the skin and subcutaneous tissue, unspecified: Secondary | ICD-10-CM

## 2013-08-29 DIAGNOSIS — E785 Hyperlipidemia, unspecified: Secondary | ICD-10-CM

## 2013-08-29 DIAGNOSIS — Z0000 Encounter for general adult medical examination without abnormal findings: Secondary | ICD-10-CM

## 2013-08-29 LAB — HEPATIC FUNCTION PANEL
ALK PHOS: 63 U/L (ref 39–117)
ALT: 10 U/L (ref 0–35)
AST: 17 U/L (ref 0–37)
Albumin: 3.8 g/dL (ref 3.5–5.2)
BILIRUBIN DIRECT: 0.1 mg/dL (ref 0.0–0.3)
BILIRUBIN INDIRECT: 0.3 mg/dL (ref 0.2–1.2)
Total Bilirubin: 0.4 mg/dL (ref 0.2–1.2)
Total Protein: 6.3 g/dL (ref 6.0–8.3)

## 2013-08-29 LAB — LIPID PANEL
CHOLESTEROL: 275 mg/dL — AB (ref 0–200)
HDL: 87 mg/dL (ref >39)
LDL Cholesterol: 155 mg/dL — ABNORMAL HIGH (ref 0–99)
Total CHOL/HDL Ratio: 3.2 Ratio
Triglycerides: 164 mg/dL — ABNORMAL HIGH (ref <150)
VLDL: 33 mg/dL (ref 0–40)

## 2013-08-29 LAB — RENAL FUNCTION PANEL
Albumin: 3.8 g/dL (ref 3.5–5.2)
BUN: 7 mg/dL (ref 6–23)
CHLORIDE: 104 meq/L (ref 96–112)
CO2: 25 meq/L (ref 19–32)
Calcium: 9 mg/dL (ref 8.4–10.5)
Creat: 0.5 mg/dL (ref 0.50–1.10)
GLUCOSE: 75 mg/dL (ref 70–99)
POTASSIUM: 4.3 meq/L (ref 3.5–5.3)
Phosphorus: 2.9 mg/dL (ref 2.3–4.6)
SODIUM: 136 meq/L (ref 135–145)

## 2013-08-29 LAB — TSH: TSH: 1.361 u[IU]/mL (ref 0.350–4.500)

## 2013-08-29 NOTE — Progress Notes (Signed)
Patient ID: Stacy Dennis, female   DOB: 1980-06-19, 33 y.o.   MRN: [id number] Stacy Dennis [id number] 1980/09/26 08/29/2013      Progress Note-Follow Up  Subjective  Chief Complaint  Chief Complaint  Patient presents with  . Annual Exam    physical    HPI  Patient is a 33 year old female in today for routine medical care. Patient is in today for annual exam. Healing fairly well. Is concerned about some changing skin lesions most notably on her right thigh and left upper eyebrow. She's otherwise been feeling well. Has been struggling with some foot pain but orthotics have been somewhat helpful. Denies CP/palp/SOB/HA/congestion/fevers/GI or GU c/o. Taking meds as prescribed  Past Medical History  Diagnosis Date  . Pregnancy related carpal tunnel syndrome, antepartum   . Endometriosis   . H/O varicella   . Hypercholesteremia     no meds  . Verrucous skin lesion 01/28/2012  . Hyperlipidemia 01/28/2012  . Preventative health care 01/28/2012  . Allergic state 01/28/2012    Past Surgical History  Procedure Laterality Date  . Eye surgery  2012    lasik  . Myringotomy    . Cesarean section  10-10-11    Family History  Problem Relation Age of Onset  . Anxiety disorder Mother   . Hyperlipidemia Mother   . Hypertension Mother   . Irritable bowel syndrome Mother     ?  . Kidney disease Father     stones  . Hyperlipidemia Father   . Thyroid disease Maternal Aunt   . Cancer Paternal Grandfather     lung- smoker  . Stroke Paternal Grandfather   . Aneurysm Paternal Grandfather   . Crohn's disease Paternal Aunt   . Hyperlipidemia Paternal Grandmother   . Hypertension Maternal Grandmother   . Heart disease Maternal Grandfather     History   Social History  . Marital Status: Married    Spouse Name: N/A    Number of Children: N/A  . Years of Education: N/A   Occupational History  . Not on file.   Social History Main Topics  . Smoking status: Never Smoker   .  Smokeless tobacco: Never Used  . Alcohol Use: Yes     Comment: socially  . Drug Use: No  . Sexual Activity: Yes    Partners: Male   Other Topics Concern  . Not on file   Social History Narrative  . No narrative on file    Current Outpatient Prescriptions on File Prior to Visit  Medication Sig Dispense Refill  . Prenatal Vit-Fe Fumarate-FA (PRENATAL MULTIVITAMIN) TABS Take 1 tablet by mouth daily.      . vitamin C (ASCORBIC ACID) 500 MG tablet Take 500 mg by mouth daily.       No current facility-administered medications on file prior to visit.    Allergies  Allergen Reactions  . Amoxicillin Nausea And Vomiting  . Lactose Intolerance (Gi)     Review of Systems  Review of Systems  Constitutional: Negative for fever and malaise/fatigue.  HENT: Negative for congestion.   Eyes: Negative for discharge.  Respiratory: Negative for shortness of breath.   Cardiovascular: Negative for chest pain, palpitations and leg swelling.  Gastrointestinal: Negative for nausea, abdominal pain and diarrhea.  Genitourinary: Negative for dysuria.  Musculoskeletal: Negative for falls.  Skin: Negative for rash.  Neurological: Negative for loss of consciousness and headaches.  Endo/Heme/Allergies: Negative for polydipsia.  Psychiatric/Behavioral: Negative for depression and suicidal ideas.  The patient is not nervous/anxious and does not have insomnia.     Objective  BP 118/70  Pulse 77  Temp(Src) 97.9 F (36.6 C) (Oral)  Ht 4' 11.5" (1.511 m)  Wt 147 lb (66.679 kg)  BMI 29.21 kg/m2  SpO2 96%  Breastfeeding? Unknown  Physical Exam  Physical Exam  Constitutional: She is oriented to person, place, and time and well-developed, well-nourished, and in no distress. No distress.  HENT:  Head: Normocephalic and atraumatic.  Eyes: Conjunctivae are normal.  Neck: Neck supple. No thyromegaly present.  Cardiovascular: Normal rate, regular rhythm and normal heart sounds.   No murmur  heard. Pulmonary/Chest: Effort normal and breath sounds normal. She has no wheezes.  Abdominal: She exhibits no distension and no mass.  Musculoskeletal: She exhibits no edema.  Lymphadenopathy:    She has no cervical adenopathy.  Neurological: She is alert and oriented to person, place, and time.  Skin: Skin is warm and dry. No rash noted. She is not diaphoretic.  Psychiatric: Memory, affect and judgment normal.    Lab Results  Component Value Date   TSH 0.92 02/03/2012   Lab Results  Component Value Date   WBC 8.6 09/21/2012   HGB 13.6 09/21/2012   HCT 38.9 09/21/2012   MCV 84.6 09/21/2012   PLT 330 09/21/2012   Lab Results  Component Value Date   CREATININE 0.6 02/03/2012   BUN 14 02/03/2012   NA 139 02/03/2012   K 4.1 02/03/2012   CL 108 02/03/2012   CO2 24 02/03/2012   Lab Results  Component Value Date   ALT 18 09/21/2012   AST 23 09/21/2012   ALKPHOS 69 09/21/2012   BILITOT 0.6 09/21/2012   Lab Results  Component Value Date   CHOL 198 09/21/2012   Lab Results  Component Value Date   HDL 67 09/21/2012   Lab Results  Component Value Date   LDLCALC 116* 09/21/2012   Lab Results  Component Value Date   TRIG 77 09/21/2012   Lab Results  Component Value Date   CHOLHDL 3.0 09/21/2012     Assessment & Plan  Preventative health care Patient encouraged to maintain heart healthy diet, regular exercise, adequate sleep. Consider daily probiotics. Take medications as prescribed  Skin lesions Patient has several lesions which have been changing one over her left eyebrow and one on right thigh as well as sun damaged skin. Is referred to dermatology for further consideration.  Hyperlipidemia Encouraged heart healthy diet, increase exercise, avoid trans fats, consider a krill oil cap daily. Numbers have increased, is encouraged to consider starting a statin due to increase in numbers.

## 2013-08-29 NOTE — Patient Instructions (Signed)

## 2013-08-29 NOTE — Progress Notes (Signed)
Pre visit review using our clinic review tool, if applicable. No additional management support is needed unless otherwise documented below in the visit note. 

## 2013-09-05 DIAGNOSIS — R21 Rash and other nonspecific skin eruption: Secondary | ICD-10-CM | POA: Insufficient documentation

## 2013-09-05 NOTE — Assessment & Plan Note (Signed)
Patient has several lesions which have been changing one over her left eyebrow and one on right thigh as well as sun damaged skin. Is referred to dermatology for further consideration.

## 2013-09-05 NOTE — Assessment & Plan Note (Signed)
Encouraged heart healthy diet, increase exercise, avoid trans fats, consider a krill oil cap daily. Numbers have increased, is encouraged to consider starting a statin due to increase in numbers.

## 2013-09-05 NOTE — Assessment & Plan Note (Signed)
Patient encouraged to maintain heart healthy diet, regular exercise, adequate sleep. Consider daily probiotics. Take medications as prescribed 

## 2013-11-30 ENCOUNTER — Encounter (HOSPITAL_COMMUNITY): Payer: Self-pay

## 2013-12-05 NOTE — H&P (Signed)
Stacy Dennis is a 33 y.o. female G2P1 @ 39 wks presenting for repeat c-section.  Pregnacny uncomplicated.  History OB History   Grav Para Term Preterm Abortions TAB SAB Ect Mult Living   0 0 0 0 0 0 1     Past Medical History  Diagnosis Date  . Pregnancy related carpal tunnel syndrome, antepartum   . Endometriosis   . H/O varicella   . Hypercholesteremia     no meds  . Verrucous skin lesion 01/28/2012  . Hyperlipidemia 01/28/2012  . Preventative health care 01/28/2012  . Allergic state 01/28/2012   Past Surgical History  Procedure Laterality Date  . Eye surgery  2012    lasik  . Myringotomy    . Cesarean section  10-10-11   Family History: family history includes Aneurysm in her paternal grandfather; Anxiety disorder in her mother; Cancer in her paternal grandfather; Crohn's disease in her paternal aunt; Heart disease in her maternal grandfather; Hyperlipidemia in her father, mother, and paternal grandmother; Hypertension in her maternal grandmother and mother; Irritable bowel syndrome in her mother; Kidney disease in her father; Stroke in her paternal grandfather; Thyroid disease in her maternal aunt. Social History:  reports that she has never smoked. She has never used smokeless tobacco. She reports that she drinks alcohol. She reports that she does not use illicit drugs.   Prenatal Transfer Tool  Maternal Diabetes: No Genetic Screening: Normal Maternal Ultrasounds/Referrals: Normal Fetal Ultrasounds or other Referrals:  None Maternal Substance Abuse:  No Significant Maternal Medications:  None Significant Maternal Lab Results:  None Other Comments:  None  ROS    unknown if currently breastfeeding. Exam Physical Exam  Gen - NAD Abd - gravid, NT Ext NT, bilateral edema Cvx deferred  Prenatal labs: ABO, Rh:   Antibody:   Rubella:   RPR:    HBsAg:    HIV:    GBS:     Assessment/Plan: Previous c-sction, desires rpt   Stacy Dennis 12/05/2013,  10:43 PM

## 2013-12-07 ENCOUNTER — Encounter (HOSPITAL_COMMUNITY): Payer: Self-pay

## 2013-12-07 ENCOUNTER — Other Ambulatory Visit (HOSPITAL_COMMUNITY): Payer: 59

## 2013-12-07 ENCOUNTER — Encounter (HOSPITAL_COMMUNITY)
Admission: RE | Admit: 2013-12-07 | Discharge: 2013-12-07 | Disposition: A | Discharge status: Home / Self Care | Payer: 59 | Source: Ambulatory Visit | Attending: Obstetrics and Gynecology | Admitting: Obstetrics and Gynecology

## 2013-12-07 LAB — RPR

## 2013-12-07 LAB — CBC
HEMATOCRIT: 34.1 % — AB (ref 36.0–46.0)
Hemoglobin: 11.5 g/dL — ABNORMAL LOW (ref 12.0–15.0)
MCH: 28.5 pg (ref 26.0–34.0)
MCHC: 33.7 g/dL (ref 30.0–36.0)
MCV: 84.4 fL (ref 78.0–100.0)
Platelets: 263 10*3/uL (ref 150–400)
RBC: 4.04 MIL/uL (ref 3.87–5.11)
RDW: 13.1 % (ref 11.5–15.5)
WBC: 12.1 10*3/uL — AB (ref 4.0–10.5)

## 2013-12-07 LAB — TYPE AND SCREEN
ABO/RH(D): B POS
Antibody Screen: NEGATIVE

## 2013-12-07 LAB — ABO/RH: ABO/RH(D): B POS

## 2013-12-07 NOTE — Patient Instructions (Signed)
Your procedure is scheduled on: 12/10/13  Enter through the Main Entrance at :1130 am Pick up desk phone and dial [phone] and inform us of your arrival.  Please call [phone] if you have any problems the morning of surgery.  Remember: Do not eat food after midnight:Sunday Clear liquids are ok until:9am Monday   You may brush your teeth the morning of surgery.   DO NOT wear jewelry, eye make-up, lipstick,body lotion, or dark fingernail polish.  (Polished toes are ok) You may wear deodorant.  If you are to be admitted after surgery, leave suitcase in car until your room has been assigned. Patients discharged on the day of surgery will not be allowed to drive home. Wear loose fitting, comfortable clothes for your ride home.

## 2013-12-10 ENCOUNTER — Encounter (HOSPITAL_COMMUNITY)
Admission: AD | Disposition: A | Discharge status: Home / Self Care | Payer: Self-pay | Source: Ambulatory Visit | Attending: Obstetrics and Gynecology

## 2013-12-10 ENCOUNTER — Inpatient Hospital Stay (HOSPITAL_COMMUNITY): Admission: RE | Admit: 2013-12-10 | Payer: 59 | Source: Ambulatory Visit | Admitting: Obstetrics and Gynecology

## 2013-12-10 ENCOUNTER — Inpatient Hospital Stay (HOSPITAL_COMMUNITY): Payer: 59 | Admitting: Anesthesiology

## 2013-12-10 ENCOUNTER — Inpatient Hospital Stay (HOSPITAL_COMMUNITY)
Admission: AD | Admit: 2013-12-10 | Discharge: 2013-12-12 | DRG: 766 | Disposition: A | Discharge status: Home / Self Care | Payer: 59 | Source: Ambulatory Visit | Attending: Obstetrics and Gynecology | Admitting: Obstetrics and Gynecology

## 2013-12-10 ENCOUNTER — Encounter (HOSPITAL_COMMUNITY): Payer: Self-pay | Admitting: *Deleted

## 2013-12-10 ENCOUNTER — Encounter (HOSPITAL_COMMUNITY): Payer: 59 | Admitting: Anesthesiology

## 2013-12-10 DIAGNOSIS — D649 Anemia, unspecified: Secondary | ICD-10-CM | POA: Diagnosis present

## 2013-12-10 DIAGNOSIS — E7800 Pure hypercholesterolemia, unspecified: Secondary | ICD-10-CM | POA: Diagnosis present

## 2013-12-10 DIAGNOSIS — G5600 Carpal tunnel syndrome, unspecified upper limb: Secondary | ICD-10-CM | POA: Diagnosis present

## 2013-12-10 DIAGNOSIS — K219 Gastro-esophageal reflux disease without esophagitis: Secondary | ICD-10-CM | POA: Diagnosis present

## 2013-12-10 DIAGNOSIS — O4290 Premature rupture of membranes, unspecified as to length of time between rupture and onset of labor, unspecified weeks of gestation: Secondary | ICD-10-CM | POA: Diagnosis present

## 2013-12-10 DIAGNOSIS — E669 Obesity, unspecified: Secondary | ICD-10-CM | POA: Diagnosis present

## 2013-12-10 DIAGNOSIS — O9902 Anemia complicating childbirth: Secondary | ICD-10-CM | POA: Diagnosis present

## 2013-12-10 DIAGNOSIS — O34219 Maternal care for unspecified type scar from previous cesarean delivery: Principal | ICD-10-CM | POA: Diagnosis present

## 2013-12-10 DIAGNOSIS — O99892 Other specified diseases and conditions complicating childbirth: Secondary | ICD-10-CM | POA: Diagnosis present

## 2013-12-10 DIAGNOSIS — M2140 Flat foot [pes planus] (acquired), unspecified foot: Secondary | ICD-10-CM | POA: Diagnosis present

## 2013-12-10 DIAGNOSIS — Z22330 Carrier of Group B streptococcus: Secondary | ICD-10-CM | POA: Diagnosis not present

## 2013-12-10 DIAGNOSIS — O9989 Other specified diseases and conditions complicating pregnancy, childbirth and the puerperium: Secondary | ICD-10-CM

## 2013-12-10 DIAGNOSIS — M205X9 Other deformities of toe(s) (acquired), unspecified foot: Secondary | ICD-10-CM | POA: Diagnosis present

## 2013-12-10 DIAGNOSIS — O99214 Obesity complicating childbirth: Secondary | ICD-10-CM

## 2013-12-10 DIAGNOSIS — Z6831 Body mass index (BMI) 31.0-31.9, adult: Secondary | ICD-10-CM

## 2013-12-10 DIAGNOSIS — Z98891 History of uterine scar from previous surgery: Secondary | ICD-10-CM

## 2013-12-10 LAB — TYPE AND SCREEN
ABO/RH(D): B POS
ANTIBODY SCREEN: NEGATIVE

## 2013-12-10 LAB — RAPID HIV SCREEN (WH-MAU): Rapid HIV Screen: NONREACTIVE

## 2013-12-10 LAB — POCT FERN TEST: POCT Fern Test: POSITIVE

## 2013-12-10 SURGERY — Surgical Case
Anesthesia: Spinal

## 2013-12-10 MED ORDER — METOCLOPRAMIDE HCL 5 MG/ML IJ SOLN
5.0000 mg | Freq: Four times a day (QID) | INTRAMUSCULAR | Status: DC | PRN
  Administered 2013-12-10: 17:00:00 via INTRAVENOUS
  Filled 2013-12-10: qty 2

## 2013-12-10 MED ORDER — NALOXONE HCL 1 MG/ML IJ SOLN
1.0000 ug/kg/h | INTRAVENOUS | Status: DC | PRN
  Filled 2013-12-10: qty 2

## 2013-12-10 MED ORDER — 0.9 % SODIUM CHLORIDE (POUR BTL) OPTIME
TOPICAL | Status: DC | PRN
  Administered 2013-12-10: 1000 mL

## 2013-12-10 MED ORDER — TERBUTALINE SULFATE 1 MG/ML IJ SOLN
INTRAMUSCULAR | Status: AC
  Filled 2013-12-10: qty 1

## 2013-12-10 MED ORDER — DIPHENHYDRAMINE HCL 25 MG PO CAPS: 25.0000 mg | ORAL_CAPSULE | ORAL | Status: DC | PRN

## 2013-12-10 MED ORDER — FAMOTIDINE IN NACL 20-0.9 MG/50ML-% IV SOLN
20.0000 mg | Freq: Once | INTRAVENOUS | Status: AC
  Administered 2013-12-10: 20 mg via INTRAVENOUS
  Filled 2013-12-10: qty 50

## 2013-12-10 MED ORDER — PHENYLEPHRINE HCL 10 MG/ML IJ SOLN
INTRAMUSCULAR | Status: AC
  Filled 2013-12-10: qty 1

## 2013-12-10 MED ORDER — MORPHINE SULFATE (PF) 0.5 MG/ML IJ SOLN
INTRAMUSCULAR | Status: DC | PRN
  Administered 2013-12-10: .15 mg via INTRATHECAL

## 2013-12-10 MED ORDER — OXYTOCIN 40 UNITS IN LACTATED RINGERS INFUSION - SIMPLE MED
INTRAVENOUS | Status: DC | PRN
  Administered 2013-12-10: 40 [IU] via INTRAVENOUS

## 2013-12-10 MED ORDER — KETOROLAC TROMETHAMINE 30 MG/ML IJ SOLN
INTRAMUSCULAR | Status: AC
  Administered 2013-12-10: 30 mg via INTRAVENOUS
  Filled 2013-12-10: qty 1

## 2013-12-10 MED ORDER — SODIUM CHLORIDE 0.9 % IV BOLUS (SEPSIS)
500.0000 mL | Freq: Once | INTRAVENOUS | Status: AC
  Administered 2013-12-10: 500 mL via INTRAVENOUS

## 2013-12-10 MED ORDER — TERBUTALINE SULFATE 1 MG/ML IJ SOLN
0.2500 mg | Freq: Once | INTRAMUSCULAR | Status: DC
Start: 2013-12-10 — End: 2013-12-12

## 2013-12-10 MED ORDER — NALBUPHINE HCL 10 MG/ML IJ SOLN: 5.0000 mg | INTRAMUSCULAR | Status: DC | PRN

## 2013-12-10 MED ORDER — LACTATED RINGERS IV SOLN
INTRAVENOUS | Status: DC | PRN
  Administered 2013-12-10: 09:00:00 via INTRAVENOUS

## 2013-12-10 MED ORDER — IBUPROFEN 600 MG PO TABS
600.0000 mg | ORAL_TABLET | Freq: Four times a day (QID) | ORAL | Status: DC
  Administered 2013-12-11 – 2013-12-12 (×7): 600 mg via ORAL
  Filled 2013-12-10 (×7): qty 1

## 2013-12-10 MED ORDER — EPHEDRINE SULFATE 50 MG/ML IJ SOLN
INTRAMUSCULAR | Status: DC | PRN
  Administered 2013-12-10: 10 mg via INTRAVENOUS

## 2013-12-10 MED ORDER — PRENATAL MULTIVITAMIN CH: 1.0000 | ORAL_TABLET | Freq: Every day | ORAL | Status: DC

## 2013-12-10 MED ORDER — LANOLIN HYDROUS EX OINT: 1.0000 "application " | TOPICAL_OINTMENT | CUTANEOUS | Status: DC | PRN

## 2013-12-10 MED ORDER — SODIUM CHLORIDE 0.9 % IJ SOLN: 3.0000 mL | INTRAMUSCULAR | Status: DC | PRN

## 2013-12-10 MED ORDER — DEXTROSE IN LACTATED RINGERS 5 % IV SOLN
INTRAVENOUS | Status: DC
  Administered 2013-12-10: 21:00:00 via INTRAVENOUS

## 2013-12-10 MED ORDER — ONDANSETRON HCL 4 MG/2ML IJ SOLN
4.0000 mg | INTRAMUSCULAR | Status: DC | PRN
  Administered 2013-12-10: 4 mg via INTRAVENOUS
  Filled 2013-12-10: qty 2

## 2013-12-10 MED ORDER — ONDANSETRON HCL 4 MG PO TABS: 4.0000 mg | ORAL_TABLET | ORAL | Status: DC | PRN

## 2013-12-10 MED ORDER — MORPHINE SULFATE 0.5 MG/ML IJ SOLN
INTRAMUSCULAR | Status: AC
  Filled 2013-12-10: qty 10

## 2013-12-10 MED ORDER — SIMETHICONE 80 MG PO CHEW
80.0000 mg | CHEWABLE_TABLET | Freq: Three times a day (TID) | ORAL | Status: DC
Start: 2013-12-10 — End: 2013-12-12
  Administered 2013-12-10 – 2013-12-12 (×4): 80 mg via ORAL
  Filled 2013-12-10 (×3): qty 1

## 2013-12-10 MED ORDER — PHENYLEPHRINE 8 MG IN D5W 100 ML (0.08MG/ML) PREMIX OPTIME
INJECTION | INTRAVENOUS | Status: DC | PRN
  Administered 2013-12-10 (×2): 60 ug/min via INTRAVENOUS

## 2013-12-10 MED ORDER — OXYCODONE-ACETAMINOPHEN 5-325 MG PO TABS
2.0000 | ORAL_TABLET | ORAL | Status: DC | PRN
Start: 2013-12-10 — End: 2013-12-12

## 2013-12-10 MED ORDER — PHENYLEPHRINE 40 MCG/ML (10ML) SYRINGE FOR IV PUSH (FOR BLOOD PRESSURE SUPPORT)
PREFILLED_SYRINGE | INTRAVENOUS | Status: AC
  Filled 2013-12-10: qty 5

## 2013-12-10 MED ORDER — SCOPOLAMINE 1 MG/3DAYS TD PT72
1.0000 | MEDICATED_PATCH | Freq: Once | TRANSDERMAL | Status: DC
  Filled 2013-12-10: qty 1

## 2013-12-10 MED ORDER — TETANUS-DIPHTH-ACELL PERTUSSIS 5-2.5-18.5 LF-MCG/0.5 IM SUSP: 0.5000 mL | Freq: Once | INTRAMUSCULAR | Status: DC

## 2013-12-10 MED ORDER — WITCH HAZEL-GLYCERIN EX PADS: 1.0000 "application " | MEDICATED_PAD | CUTANEOUS | Status: DC | PRN

## 2013-12-10 MED ORDER — OXYCODONE-ACETAMINOPHEN 5-325 MG PO TABS: 1.0000 | ORAL_TABLET | ORAL | Status: DC | PRN

## 2013-12-10 MED ORDER — CITRIC ACID-SODIUM CITRATE 334-500 MG/5ML PO SOLN
30.0000 mL | Freq: Once | ORAL | Status: AC
Start: 2013-12-10 — End: 2013-12-10
  Administered 2013-12-10: 30 mL via ORAL
  Filled 2013-12-10: qty 15

## 2013-12-10 MED ORDER — ONDANSETRON HCL 4 MG/2ML IJ SOLN
INTRAMUSCULAR | Status: AC
  Filled 2013-12-10: qty 2

## 2013-12-10 MED ORDER — SCOPOLAMINE 1 MG/3DAYS TD PT72: 1.0000 | MEDICATED_PATCH | Freq: Once | TRANSDERMAL | Status: DC

## 2013-12-10 MED ORDER — LACTATED RINGERS IV SOLN: INTRAVENOUS | Status: DC

## 2013-12-10 MED ORDER — MENTHOL 3 MG MT LOZG: 1.0000 | LOZENGE | OROMUCOSAL | Status: DC | PRN

## 2013-12-10 MED ORDER — OXYTOCIN 10 UNIT/ML IJ SOLN
INTRAMUSCULAR | Status: AC
  Filled 2013-12-10: qty 4

## 2013-12-10 MED ORDER — FENTANYL CITRATE 0.05 MG/ML IJ SOLN
INTRAMUSCULAR | Status: DC | PRN
  Administered 2013-12-10: 25 ug via INTRATHECAL

## 2013-12-10 MED ORDER — MEDROXYPROGESTERONE ACETATE 150 MG/ML IM SUSP: 150.0000 mg | INTRAMUSCULAR | Status: DC | PRN

## 2013-12-10 MED ORDER — BUPIVACAINE IN DEXTROSE 0.75-8.25 % IT SOLN
INTRATHECAL | Status: DC | PRN
  Administered 2013-12-10: 1.2 mL via INTRATHECAL

## 2013-12-10 MED ORDER — CLINDAMYCIN PHOSPHATE 900 MG/50ML IV SOLN
900.0000 mg | Freq: Once | INTRAVENOUS | Status: DC
  Filled 2013-12-10: qty 50

## 2013-12-10 MED ORDER — FENTANYL CITRATE 0.05 MG/ML IJ SOLN: 25.0000 ug | INTRAMUSCULAR | Status: DC | PRN

## 2013-12-10 MED ORDER — DIPHENHYDRAMINE HCL 25 MG PO CAPS: 25.0000 mg | ORAL_CAPSULE | Freq: Four times a day (QID) | ORAL | Status: DC | PRN

## 2013-12-10 MED ORDER — NALOXONE HCL 0.4 MG/ML IJ SOLN: 0.4000 mg | INTRAMUSCULAR | Status: DC | PRN

## 2013-12-10 MED ORDER — ONDANSETRON HCL 4 MG/2ML IJ SOLN
INTRAMUSCULAR | Status: DC | PRN
  Administered 2013-12-10: 4 mg via INTRAVENOUS

## 2013-12-10 MED ORDER — ONDANSETRON HCL 4 MG/2ML IJ SOLN: 4.0000 mg | Freq: Three times a day (TID) | INTRAMUSCULAR | Status: DC | PRN

## 2013-12-10 MED ORDER — KETOROLAC TROMETHAMINE 30 MG/ML IJ SOLN: 30.0000 mg | Freq: Four times a day (QID) | INTRAMUSCULAR | Status: AC | PRN

## 2013-12-10 MED ORDER — SIMETHICONE 80 MG PO CHEW: 80.0000 mg | CHEWABLE_TABLET | ORAL | Status: DC | PRN

## 2013-12-10 MED ORDER — SIMETHICONE 80 MG PO CHEW
80.0000 mg | CHEWABLE_TABLET | ORAL | Status: DC
  Administered 2013-12-11 (×2): 80 mg via ORAL
  Filled 2013-12-10 (×2): qty 1

## 2013-12-10 MED ORDER — MEPERIDINE HCL 25 MG/ML IJ SOLN: 6.2500 mg | INTRAMUSCULAR | Status: DC | PRN

## 2013-12-10 MED ORDER — NALBUPHINE HCL 10 MG/ML IJ SOLN: 5.0000 mg | Freq: Once | INTRAMUSCULAR | Status: AC | PRN

## 2013-12-10 MED ORDER — INFLUENZA VAC SPLIT QUAD 0.5 ML IM SUSY
0.5000 mL | PREFILLED_SYRINGE | INTRAMUSCULAR | Status: AC
  Administered 2013-12-12: 0.5 mL via INTRAMUSCULAR

## 2013-12-10 MED ORDER — SENNOSIDES-DOCUSATE SODIUM 8.6-50 MG PO TABS
2.0000 | ORAL_TABLET | ORAL | Status: DC
  Administered 2013-12-11 (×2): 2 via ORAL
  Filled 2013-12-10 (×2): qty 2

## 2013-12-10 MED ORDER — FENTANYL CITRATE 0.05 MG/ML IJ SOLN
INTRAMUSCULAR | Status: AC
  Filled 2013-12-10: qty 2

## 2013-12-10 MED ORDER — MEASLES, MUMPS & RUBELLA VAC ~~LOC~~ INJ
0.5000 mL | INJECTION | Freq: Once | SUBCUTANEOUS | Status: DC
  Filled 2013-12-10: qty 0.5

## 2013-12-10 MED ORDER — LACTATED RINGERS IV SOLN
INTRAVENOUS | Status: DC | PRN
  Administered 2013-12-10 (×3): via INTRAVENOUS

## 2013-12-10 MED ORDER — DIPHENHYDRAMINE HCL 50 MG/ML IJ SOLN: 12.5000 mg | INTRAMUSCULAR | Status: DC | PRN

## 2013-12-10 MED ORDER — DIBUCAINE 1 % RE OINT: 1.0000 "application " | TOPICAL_OINTMENT | RECTAL | Status: DC | PRN

## 2013-12-10 MED ORDER — KETOROLAC TROMETHAMINE 30 MG/ML IJ SOLN
30.0000 mg | Freq: Four times a day (QID) | INTRAMUSCULAR | Status: AC | PRN
  Administered 2013-12-10 (×2): 30 mg via INTRAVENOUS
  Filled 2013-12-10: qty 1

## 2013-12-10 MED ORDER — OXYTOCIN 40 UNITS IN LACTATED RINGERS INFUSION - SIMPLE MED: 62.5000 mL/h | INTRAVENOUS | Status: AC

## 2013-12-10 SURGICAL SUPPLY — 25 items
BLADE SURG 10 STRL SS (BLADE) IMPLANT
CLAMP CORD UMBIL (MISCELLANEOUS) IMPLANT
CLOTH BEACON ORANGE TIMEOUT ST (SAFETY) IMPLANT
DRAPE SHEET LG 3/4 BI-LAMINATE (DRAPES) IMPLANT
DRSG OPSITE POSTOP 4X10 (GAUZE/BANDAGES/DRESSINGS) IMPLANT
DURAPREP 26ML APPLICATOR (WOUND CARE) IMPLANT
ELECT REM PT RETURN 9FT ADLT (ELECTROSURGICAL)
ELECTRODE REM PT RTRN 9FT ADLT (ELECTROSURGICAL) IMPLANT
EXTRACTOR VACUUM M CUP 4 TUBE (SUCTIONS) IMPLANT
GLOVE BIO SURGEON STRL SZ7 (GLOVE) IMPLANT
GOWN STRL REUS W/TWL LRG LVL3 (GOWN DISPOSABLE) IMPLANT
KIT ABG SYR 3ML LUER SLIP (SYRINGE) IMPLANT
NEEDLE HYPO 25X5/8 SAFETYGLIDE (NEEDLE) IMPLANT
NS IRRIG 1000ML POUR BTL (IV SOLUTION) IMPLANT
PACK C SECTION WH (CUSTOM PROCEDURE TRAY) IMPLANT
PAD OB MATERNITY 4.3X12.25 (PERSONAL CARE ITEMS) IMPLANT
STRIP CLOSURE SKIN 1/2X4 (GAUZE/BANDAGES/DRESSINGS) IMPLANT
SUT CHROMIC 0 CTX 36 (SUTURE) IMPLANT
SUT MON AB 4-0 PS1 27 (SUTURE) IMPLANT
SUT PDS AB 0 CT1 27 (SUTURE) IMPLANT
SUT VIC AB 3-0 CT1 27 (SUTURE)
SUT VIC AB 3-0 CT1 TAPERPNT 27 (SUTURE) IMPLANT
TOWEL OR 17X24 6PK STRL BLUE (TOWEL DISPOSABLE) IMPLANT
TRAY FOLEY CATH 14FR (SET/KITS/TRAYS/PACK) IMPLANT
WATER STERILE IRR 1000ML POUR (IV SOLUTION) IMPLANT

## 2013-12-10 SURGICAL SUPPLY — 31 items
BLADE SURG 10 STRL SS (BLADE) ×4 IMPLANT
CLAMP CORD UMBIL (MISCELLANEOUS) IMPLANT
CLOTH BEACON ORANGE TIMEOUT ST (SAFETY) ×2 IMPLANT
DERMABOND ADVANCED (GAUZE/BANDAGES/DRESSINGS) ×1
DERMABOND ADVANCED .7 DNX12 (GAUZE/BANDAGES/DRESSINGS) ×1 IMPLANT
DRAPE SHEET LG 3/4 BI-LAMINATE (DRAPES) IMPLANT
DRSG OPSITE POSTOP 4X10 (GAUZE/BANDAGES/DRESSINGS) ×2 IMPLANT
DURAPREP 26ML APPLICATOR (WOUND CARE) ×2 IMPLANT
ELECT REM PT RETURN 9FT ADLT (ELECTROSURGICAL) ×2
ELECTRODE REM PT RTRN 9FT ADLT (ELECTROSURGICAL) ×1 IMPLANT
EXTRACTOR VACUUM M CUP 4 TUBE (SUCTIONS) IMPLANT
GLOVE BIO SURGEON STRL SZ 6.5 (GLOVE) ×2 IMPLANT
GLOVE BIOGEL PI IND STRL 7.0 (GLOVE) ×1 IMPLANT
GLOVE BIOGEL PI INDICATOR 7.0 (GLOVE) ×1
GOWN STRL REUS W/TWL LRG LVL3 (GOWN DISPOSABLE) ×4 IMPLANT
KIT ABG SYR 3ML LUER SLIP (SYRINGE) IMPLANT
NEEDLE HYPO 25X5/8 SAFETYGLIDE (NEEDLE) IMPLANT
NS IRRIG 1000ML POUR BTL (IV SOLUTION) ×2 IMPLANT
PACK C SECTION WH (CUSTOM PROCEDURE TRAY) ×2 IMPLANT
PAD OB MATERNITY 4.3X12.25 (PERSONAL CARE ITEMS) ×2 IMPLANT
STAPLER VISISTAT 35W (STAPLE) IMPLANT
SUT CHROMIC 0 CT 802H (SUTURE) IMPLANT
SUT CHROMIC 0 CTX 36 (SUTURE) ×6 IMPLANT
SUT MNCRL AB 0 CT1 27 (SUTURE) ×2 IMPLANT
SUT MON AB-0 CT1 36 (SUTURE) ×2 IMPLANT
SUT PDS AB 0 CTX 60 (SUTURE) ×2 IMPLANT
SUT PLAIN 0 NONE (SUTURE) ×2 IMPLANT
SUT VIC AB 4-0 KS 27 (SUTURE) IMPLANT
TOWEL OR 17X24 6PK STRL BLUE (TOWEL DISPOSABLE) ×2 IMPLANT
TRAY FOLEY CATH 14FR (SET/KITS/TRAYS/PACK) IMPLANT
WATER STERILE IRR 1000ML POUR (IV SOLUTION) ×2 IMPLANT

## 2013-12-10 NOTE — Op Note (Signed)
Cesarean Section Procedure Note   Stacy Dennis  12/10/2013  Indications: PROM, labor, previous c-section  Pre-operative Diagnosis: previous cesearan section.   Post-operative Diagnosis: Same   Surgeon: Surgeon(s) and Role:    * Zelphia Cairo, MD - Primary   Assistants: none  Anesthesia: spinal   Procedure Details:  The patient was seen in the Holding Room. The risks, benefits, complications, treatment options, and expected outcomes were discussed with the patient. The patient concurred with the proposed plan, giving informed consent. identified as Stacy Dennis and the procedure verified as C-Section Delivery. A Time Out was held and the above information confirmed.  After induction of anesthesia, the patient was draped and prepped in the usual sterile manner. A transverse was made and carried down through the subcutaneous tissue to the fascia. Fascial incision was made and extended transversely. The fascia was separated from the underlying rectus tissue superiorly and inferiorly. The peritoneum was identified and entered. Peritoneal incision was extended longitudinally. The utero-vesical peritoneal reflection was incised transversely and the bladder flap was bluntly freed from the lower uterine segment. A low transverse uterine incision was made. Delivered from cephalic presentation was a vigorous female infant with Apgar scores of 9 at one minute and 9 at five minutes. Cord ph was not sent the umbilical cord was clamped and cut cord blood was obtained for evaluation. The placenta was removed Intact and appeared normal. The uterine outline, tubes and ovaries appeared normal}. The uterine incision was closed with running locked sutures of 0chromic gut.   Hemostasis was observed. Lavage was carried out until clear. The fascia was then reapproximated with running sutures of 0PDS. The skin was closed with 4-0Vicryl.   Instrument, sponge, and needle counts were correct prior the abdominal  closure and were correct at the conclusion of the case.      Estimated Blood Loss: 850cc   Urine Output: clear  Specimens: @   Complications: no complications  Disposition: PACU - hemodynamically stable.   Maternal Condition: stable   Baby condition / location:  Couplet care / Skin to Skin  Attending Attestation: I was present and scrubbed for the entire procedure.   Signed: Surgeon(s): Zelphia Cairo, MD

## 2013-12-10 NOTE — Anesthesia Postprocedure Evaluation (Signed)
  Anesthesia Post-op Note  Patient: Stacy Dennis  Procedure(s) Performed: Procedure(s) with comments: CESAREAN SECTION (N/A) - Repeat edc 9/27  Patient Location: PACU  Anesthesia Type:Spinal  Level of Consciousness: awake, alert  and oriented  Airway and Oxygen Therapy: Patient Spontanous Breathing  Post-op Pain: none  Post-op Assessment: Post-op Vital signs reviewed, Patient's Cardiovascular Status Stable, Respiratory Function Stable, Patent Airway, No signs of Nausea or vomiting, Pain level controlled, No headache and No backache  Post-op Vital Signs: Reviewed and stable  Last Vitals:  Filed Vitals:   12/10/13 1030  BP: 107/61  Pulse: 86  Temp:   Resp: 18    Complications: No apparent anesthesia complications

## 2013-12-10 NOTE — Consult Note (Signed)
Neonatology Note:   Attendance at C-section:    I was asked by Dr. Renaldo Fiddler to attend this repeat C/S at term. The mother is a G2P1 B pos, GBS pos with endometriosis. ROM 6 hours prior to delivery, fluid clear. Infant vigorous with good spontaneous cry and tone. Needed only minimal bulb suctioning. Ap 9/9. Lungs clear to ausc in DR. To CN to care of Pediatrician.   Doretha Sou, MD

## 2013-12-10 NOTE — H&P (Signed)
Full H&P dated 9/16 Pt presented today with ROM - clear fluid Now feeling contractions Continue plan of repeat c-section R/b/a discussed, questions answered, informed consent

## 2013-12-10 NOTE — Lactation Note (Signed)
This note was copied from the chart of Stacy Alis Kundrat. Lactation Consultation Note Initial visit at 8 hours of age.  Baby is finishing with bath.  Mom reports several good feedings and a void, no stool yet.  Mom is experienced and denies concern or pain.  Baby STS with mom after bath and latched himself with ease.  Assisted with positioning to allow baby more STS by rolling baby belly to belly.  Audible gulping at this feeding observed for several minutes.  Good strong jaw excursions noted with minimal cheek dimpling (posibly natural to this baby)  Mom denies pain. [facility] LC resources given and discussed.  Encouraged to feed with early cues on demand.  Early newborn behavior discussed.  Hand expression demonstrated with colostrum visible.  Mom to call for assist as needed.    Patient Name: Stacy Dennis [id number] Date: 12/10/2013 Reason for consult: Initial assessment   Maternal Data Has patient been taught Hand Expression?: Yes Does the patient have breastfeeding experience prior to this delivery?: Yes  Feeding Length of feed: 25 min  LATCH Score/Interventions Latch: Grasps breast easily, tongue down, lips flanged, rhythmical sucking.  Audible Swallowing: Spontaneous and intermittent Intervention(s): Skin to skin  Type of Nipple: Everted at rest and after stimulation  Comfort (Breast/Nipple): Soft / non-tender     Hold (Positioning): No assistance needed to correctly position infant at breast. Intervention(s): Breastfeeding basics reviewed;Support Pillows;Skin to skin  LATCH Score: 10  Lactation Tools Discussed/Used     Consult Status Consult Status: Follow-up Date: 12/11/13 Follow-up type: In-patient    Beverely Risen Arvella Merles 12/10/2013, 5:33 PM

## 2013-12-10 NOTE — Anesthesia Preprocedure Evaluation (Addendum)
Anesthesia Evaluation  Patient identified by MRN, date of birth, ID band Patient awake    Reviewed: Allergy & Precautions, H&P , NPO status , Patient's Chart, lab work & pertinent test results  Airway Mallampati: III TM Distance: >3 FB Neck ROM: Full    Dental no notable dental hx. (+) Teeth Intact   Pulmonary neg pulmonary ROS,  breath sounds clear to auscultation  Pulmonary exam normal       Cardiovascular negative cardio ROS  Rhythm:Regular Rate:Normal     Neuro/Psych Pregnancy exacerbated carpal tunnel syndrome  Neuromuscular disease negative psych ROS   GI/Hepatic Neg liver ROS, GERD-  Medicated and Controlled,  Endo/Other  Obesity Hypercholesterolemia  Renal/GU negative Renal ROS  negative genitourinary   Musculoskeletal Hallux limitus Pes planus   Abdominal (+) + obese,   Peds  Hematology  (+) anemia ,   Anesthesia Other Findings   Reproductive/Obstetrics Previous C/Section SROM                          Anesthesia Physical Anesthesia Plan  ASA: II  Anesthesia Plan: Spinal   Post-op Pain Management:    Induction:   Airway Management Planned: Natural Airway  Additional Equipment:   Intra-op Plan:   Post-operative Plan:   Informed Consent: I have reviewed the patients History and Physical, chart, labs and discussed the procedure including the risks, benefits and alternatives for the proposed anesthesia with the patient or authorized representative who has indicated his/her understanding and acceptance.     Plan Discussed with: Anesthesiologist, CRNA and Surgeon  Anesthesia Plan Comments:         Anesthesia Quick Evaluation

## 2013-12-10 NOTE — Anesthesia Procedure Notes (Signed)
Spinal  Patient location during procedure: OR Start time: 12/10/2013 8:58 AM Staffing Anesthesiologist: Dymir Neeson A. Performed by: anesthesiologist  Preanesthetic Checklist Completed: patient identified, site marked, surgical consent, pre-op evaluation, timeout performed, IV checked, risks and benefits discussed and monitors and equipment checked Spinal Block Patient position: sitting Prep: site prepped and draped and DuraPrep Patient monitoring: heart rate, cardiac monitor, continuous pulse ox and blood pressure Approach: midline Location: L3-4 Injection technique: single-shot Needle Needle type: Sprotte  Needle gauge: 24 G Needle length: 9 cm Needle insertion depth: 5 cm Assessment Sensory level: T4 Additional Notes Patient tolerated procedure well. Adequate sensory level.

## 2013-12-10 NOTE — Addendum Note (Signed)
Addendum created 12/10/13 1708 by Shanon Payor, CRNA   Modules edited: Notes Section   Notes Section:  File: [id number]

## 2013-12-10 NOTE — Transfer of Care (Signed)
Immediate Anesthesia Transfer of Care Note  Patient: Stacy Dennis  Procedure(s) Performed: Procedure(s): CESAREAN SECTION (N/A)  Patient Location: PACU  Anesthesia Type:Spinal  Level of Consciousness: awake, alert  and oriented  Airway & Oxygen Therapy: Patient Spontanous Breathing  Post-op Assessment: Report given to PACU RN and Post -op Vital signs reviewed and stable  Post vital signs: Reviewed and stable  Complications: No apparent anesthesia complications

## 2013-12-10 NOTE — Anesthesia Postprocedure Evaluation (Signed)
  Anesthesia Post-op Note  Patient: Stacy Dennis  Procedure(s) Performed: Procedure(s) with comments: CESAREAN SECTION (N/A) - Repeat edc 9/27  Patient Location: PACU  Anesthesia Type:General  Level of Consciousness: awake, alert  and oriented  Airway and Oxygen Therapy: Patient Spontanous Breathing  Post-op Pain: none  Post-op Assessment: Post-op Vital signs reviewed, Patient's Cardiovascular Status Stable, Respiratory Function Stable, No headache, No backache, No residual numbness and No residual motor weakness  Post-op Vital Signs: Reviewed and stable  Last Vitals:  Filed Vitals:   12/10/13 1536  BP: 107/64  Pulse: 90  Temp: 36.7 C  Resp: 16    Complications: No apparent anesthesia complications

## 2013-12-11 ENCOUNTER — Encounter (HOSPITAL_COMMUNITY): Payer: Self-pay | Admitting: Obstetrics and Gynecology

## 2013-12-11 LAB — CBC
HEMATOCRIT: 27.4 % — AB (ref 36.0–46.0)
Hemoglobin: 9.2 g/dL — ABNORMAL LOW (ref 12.0–15.0)
MCH: 28.6 pg (ref 26.0–34.0)
MCHC: 33.6 g/dL (ref 30.0–36.0)
MCV: 85.1 fL (ref 78.0–100.0)
PLATELETS: 234 10*3/uL (ref 150–400)
RBC: 3.22 MIL/uL — ABNORMAL LOW (ref 3.87–5.11)
RDW: 13 % (ref 11.5–15.5)
WBC: 13.9 10*3/uL — ABNORMAL HIGH (ref 4.0–10.5)

## 2013-12-11 LAB — BIRTH TISSUE RECOVERY COLLECTION (PLACENTA DONATION)

## 2013-12-11 NOTE — Progress Notes (Signed)
Patient in hall ambulating at this time.

## 2013-12-11 NOTE — Progress Notes (Signed)
Subjective: Postpartum Day 1: Cesarean Delivery Patient reports tolerating PO and no problems voiding.    Objective: Vital signs in last 24 hours: Temp:  [97.3 F (36.3 C)-99 F (37.2 C)] 98.7 F (37.1 C) (09/22 0515) Pulse Rate:  [75-98] 81 (09/22 0515) Resp:  [14-19] 16 (09/22 0515) BP: (93-108)/(52-64) 98/58 mmHg (09/22 0515) SpO2:  [95 %-100 %] 100 % (09/22 0515)  Physical Exam:  General: alert and cooperative Lochia: appropriate Uterine Fundus: firm Incision: small old drainage noted on bandage. No active bleeding noted DVT Evaluation: No evidence of DVT seen on physical exam. Negative Homan's sign. No cords or calf tenderness. No significant calf/ankle edema.   Recent Labs  12/11/13 0540  HGB 9.2*  HCT 27.4*    Assessment/Plan: Status post Cesarean section. Doing well postoperatively.  Desires circ prior to discharge.  CURTIS,CAROL G 12/11/2013, 7:59 AM

## 2013-12-11 NOTE — Lactation Note (Signed)
This note was copied from the chart of Stacy Kambry Tapp. Lactation Consultation Note: Observed latch. Mom easily able to latch baby by herself. Reports that she had an overabundance of breast milk with last baby. Asking about pumping, Suggested since baby is nursing so well not to pump at this time. Is [employer] employee and will get pump from our office. No further questions at this time. To call prn.  Patient Name: Stacy Dennis [id number] Date: 12/11/2013 Reason for consult: Follow-up assessment   Maternal Data Formula Feeding for Exclusion: No Has patient been taught Hand Expression?: Yes Does the patient have breastfeeding experience prior to this delivery?: Yes  Feeding Feeding Type: Breast Fed  LATCH Score/Interventions Latch: Grasps breast easily, tongue down, lips flanged, rhythmical sucking.  Audible Swallowing: A few with stimulation  Type of Nipple: Everted at rest and after stimulation  Comfort (Breast/Nipple): Soft / non-tender     Hold (Positioning): No assistance needed to correctly position infant at breast. Intervention(s): Breastfeeding basics reviewed;Support Pillows  LATCH Score: 9  Lactation Tools Discussed/Used     Consult Status Consult Status: PRN    Pamelia Hoit 12/11/2013, 2:44 PM

## 2013-12-12 MED ORDER — IBUPROFEN 600 MG PO TABS: 600.0000 mg | ORAL_TABLET | Freq: Four times a day (QID) | ORAL | Status: DC

## 2013-12-12 NOTE — Discharge Summary (Signed)
Obstetric Discharge Summary Reason for Admission: rupture of membranes Prenatal Procedures: ultrasound Intrapartum Procedures: cesarean: low cervical, transverse Postpartum Procedures: none Complications-Operative and Postpartum: none Hemoglobin  Date Value Ref Range Status  12/11/2013 9.2* 12.0 - 15.0 g/dL Final     HCT  Date Value Ref Range Status  12/11/2013 27.4* 36.0 - 46.0 % Final    Physical Exam:  General: alert and cooperative Lochia: appropriate Uterine Fundus: firm Incision: healing well DVT Evaluation: No evidence of DVT seen on physical exam. Negative Homan's sign. No cords or calf tenderness. No significant calf/ankle edema.  Discharge Diagnoses: Term Pregnancy-delivered  Discharge Information: Date: 12/12/2013 Activity: pelvic rest Diet: routine Medications: PNV and Ibuprofen Condition: stable Instructions: refer to practice specific booklet Discharge to: home   Newborn Data: Live born female  Birth Weight: 8 lb 6 oz (3800 g) APGAR: 9, 9  Home with mother.  CURTIS,CAROL G 12/12/2013, 8:19 AM

## 2014-01-21 ENCOUNTER — Encounter (HOSPITAL_COMMUNITY): Payer: Self-pay | Admitting: Obstetrics and Gynecology

## 2014-04-04 ENCOUNTER — Encounter (HOSPITAL_COMMUNITY): Payer: Self-pay | Admitting: Obstetrics and Gynecology

## 2014-08-29 ENCOUNTER — Encounter (HOSPITAL_COMMUNITY): Payer: Self-pay | Admitting: Obstetrics and Gynecology

## 2014-10-15 ENCOUNTER — Encounter: Payer: 59 | Admitting: Sports Medicine

## 2014-11-26 ENCOUNTER — Encounter: Payer: 59 | Admitting: Sports Medicine

## 2014-11-27 ENCOUNTER — Ambulatory Visit (INDEPENDENT_AMBULATORY_CARE_PROVIDER_SITE_OTHER): Payer: 59 | Admitting: Sports Medicine

## 2014-11-27 ENCOUNTER — Encounter: Payer: Self-pay | Admitting: Sports Medicine

## 2014-11-27 VITALS — BP 125/72 | Ht 59.0 in | Wt 128.0 lb

## 2014-11-27 DIAGNOSIS — R269 Unspecified abnormalities of gait and mobility: Secondary | ICD-10-CM

## 2014-11-27 NOTE — Assessment & Plan Note (Signed)
Custom orthotics have really helped  We'll plan to make her a smart cell insert to protect her forefoot for certain sports activities

## 2014-11-27 NOTE — Progress Notes (Signed)
Patient ID: Stacy Dennis, female   DOB: February 27, 1981, 34 y.o.   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

## 2014-12-03 ENCOUNTER — Encounter: Payer: 59 | Admitting: Sports Medicine

## 2015-01-10 ENCOUNTER — Telehealth: Payer: Self-pay | Admitting: Family Medicine

## 2015-01-10 DIAGNOSIS — E785 Hyperlipidemia, unspecified: Secondary | ICD-10-CM

## 2015-01-10 NOTE — Telephone Encounter (Signed)
Happy to check her cholesterol sooner, where does she want to go, [facility] or here? Would check with a CMPr

## 2015-01-10 NOTE — Telephone Encounter (Signed)
Caller name: Lewis Shockshly Flaten  Relationship to patient: Self   Can be reached: [phone]  Pharmacy:  Reason for call: Had to reschedule pt, her new CPE date is in March. She want to have her Cholesterol checked at a different office meanwhile. Could pt have a lab only appt?   Please advise.

## 2015-01-13 NOTE — Telephone Encounter (Signed)
Called the patient left a detailed message of PCP request.

## 2015-01-14 NOTE — Telephone Encounter (Signed)
Called the patient left message to call back 

## 2015-01-14 NOTE — Telephone Encounter (Signed)
Called left message to call back 

## 2015-01-15 NOTE — Telephone Encounter (Signed)
[facility]. Please enter orders she'll probably go in a week or so.

## 2015-01-16 NOTE — Telephone Encounter (Signed)
Let me know what labs to [facility] as that is where she wants to go.

## 2015-01-16 NOTE — Telephone Encounter (Signed)
Lipids and cmp

## 2015-01-17 NOTE — Telephone Encounter (Signed)
Put order in for Lipid and cmp at the [facility].  Patient is aware and will go at her convenience.

## 2015-01-17 NOTE — Addendum Note (Signed)
Addended by: Scharlene GlossEWING, Gifford Ballon B on: 01/17/2015 08:11 AM   Modules accepted: Orders

## 2015-01-28 ENCOUNTER — Encounter: Payer: Self-pay | Admitting: Sports Medicine

## 2015-01-28 ENCOUNTER — Ambulatory Visit (INDEPENDENT_AMBULATORY_CARE_PROVIDER_SITE_OTHER): Payer: 59 | Admitting: Sports Medicine

## 2015-01-28 VITALS — BP 119/78 | Ht 59.5 in | Wt 125.0 lb

## 2015-01-28 DIAGNOSIS — R269 Unspecified abnormalities of gait and mobility: Secondary | ICD-10-CM

## 2015-01-28 NOTE — Progress Notes (Signed)
   Subjective:    Patient ID: Stacy Dennis, female    DOB: 05/18/1980, 34 y.o.   MRN: [id number]  HPI 34 year old female well known to sports medicine clinic presenting for an appointment to have her new orthotics made her more forefoot support given her chronic forefoot pain with running and her forefoot running style. She reports that her current shoes have helped but do not provide much relief when doing explosive activities such as high intensity interval training, plyometrics and insanity training.  We planned to try a forefoot cushioned style of orthotic which would fit her running gait  Past medical history, past surgical history, medications, allergies and social history were reviewed and updated.  Review of Systems A 10-point review of systems was performed and negative other than detailed above.    Objective:   Physical Exam Gen.: Resting comfortably, no acute distress BP 119/78 mmHg  Ht 4' 11.5" (1.511 m)  Wt 125 lb (56.7 kg)  BMI 24.83 kg/m2  Cardiopulmonary: Nonlabored respirations Psych: Appropriate mood/affect  Standing foot exam: -- Collapse of forefoot bilaterally -- Recruitment of 3rd phalanx on LEFT -- Bunionette formation early on bilateral feet, more pronounced on LEFT with subluxation of bilateral 5th phalanges  Patient was fitted for a: SMART-CELL ORTHOTIC The orthotic was heated and afterward the patient stood on the orthotic blank positioned on the orthotic stand. The patient was positioned in subtalar neutral position and 10 degrees of ankle dorsiflexion in a weight bearing stance. After completion of molding, a stable base was applied to the orthotic blank. The blank was ground to a stable position for weight bearing. Size: 8 Base: NONE; Posting: NONE; Additional orthotic padding: NONE  Post orthotic gait shows forefoot strike Very neutral with no excess pronation  Time for preparation was 30 mins with more than 50% in face to face counsling    Assessment & Plan:   34 y/o female presenting to clinic for a scheduled appointment to have her orthotics redone with Smart cell technology. A new pair of Smart cell orthotics was created without difficulty. The patient was observed running/jogging after the orthotics were made and had a neutral foot strike, midline posture and symmetric arm/leg swelling. Patient will follow-up as needed for any adjustments to those orthotics in the next 30 days. Otherwise follow-up as needed for any sports medicine/orthopedic/muscular skeletal injury or concern.

## 2015-01-28 NOTE — Assessment & Plan Note (Signed)
See OV note.  

## 2015-02-19 ENCOUNTER — Other Ambulatory Visit (INDEPENDENT_AMBULATORY_CARE_PROVIDER_SITE_OTHER): Payer: 59

## 2015-02-19 DIAGNOSIS — E785 Hyperlipidemia, unspecified: Secondary | ICD-10-CM

## 2015-02-19 LAB — COMPREHENSIVE METABOLIC PANEL
ALT: 14 U/L (ref 0–35)
AST: 17 U/L (ref 0–37)
Albumin: 4.2 g/dL (ref 3.5–5.2)
Alkaline Phosphatase: 35 U/L — ABNORMAL LOW (ref 39–117)
BILIRUBIN TOTAL: 0.5 mg/dL (ref 0.2–1.2)
BUN: 9 mg/dL (ref 6–23)
CALCIUM: 8.9 mg/dL (ref 8.4–10.5)
CHLORIDE: 107 meq/L (ref 96–112)
CO2: 24 mEq/L (ref 19–32)
Creatinine, Ser: 0.63 mg/dL (ref 0.40–1.20)
GFR: 114.47 mL/min (ref >60.00)
Glucose, Bld: 92 mg/dL (ref 70–99)
Potassium: 3.8 mEq/L (ref 3.5–5.1)
Sodium: 138 mEq/L (ref 135–145)
Total Protein: 6.8 g/dL (ref 6.0–8.3)

## 2015-02-19 LAB — LIPID PANEL
CHOL/HDL RATIO: 4
Cholesterol: 189 mg/dL (ref 0–200)
HDL: 49.5 mg/dL (ref >39.00)
LDL CALC: 121 mg/dL — AB (ref 0–99)
NonHDL: 139.07
TRIGLYCERIDES: 91 mg/dL (ref 0.0–149.0)
VLDL: 18.2 mg/dL (ref 0.0–40.0)

## 2015-03-04 ENCOUNTER — Encounter: Payer: 59 | Admitting: Family Medicine

## 2015-04-21 MED FILL — LARIN 24 FE 1 MG-20 MCG TAB: 1-20 | 84 days supply | Qty: 84 | Fill #1

## 2015-06-17 ENCOUNTER — Encounter: Payer: Self-pay | Admitting: Family Medicine

## 2015-06-17 ENCOUNTER — Ambulatory Visit (INDEPENDENT_AMBULATORY_CARE_PROVIDER_SITE_OTHER): Payer: 59 | Admitting: Family Medicine

## 2015-06-17 VITALS — BP 112/72 | HR 82 | Temp 98.3°F | Ht 60.0 in | Wt 128.0 lb

## 2015-06-17 DIAGNOSIS — Z0000 Encounter for general adult medical examination without abnormal findings: Secondary | ICD-10-CM

## 2015-06-17 DIAGNOSIS — R61 Generalized hyperhidrosis: Secondary | ICD-10-CM | POA: Diagnosis not present

## 2015-06-17 DIAGNOSIS — N926 Irregular menstruation, unspecified: Secondary | ICD-10-CM

## 2015-06-17 DIAGNOSIS — Z8639 Personal history of other endocrine, nutritional and metabolic disease: Secondary | ICD-10-CM

## 2015-06-17 DIAGNOSIS — L309 Dermatitis, unspecified: Secondary | ICD-10-CM

## 2015-06-17 DIAGNOSIS — E785 Hyperlipidemia, unspecified: Secondary | ICD-10-CM | POA: Diagnosis not present

## 2015-06-17 HISTORY — DX: Irregular menstruation, unspecified: N92.6

## 2015-06-17 HISTORY — DX: Dermatitis, unspecified: L30.9

## 2015-06-17 NOTE — Assessment & Plan Note (Signed)
Patient encouraged to maintain heart healthy diet, regular exercise, adequate sleep. Consider daily probiotics. Take medications as prescribed 

## 2015-06-17 NOTE — Assessment & Plan Note (Addendum)
Patient encouraged to maintain heart healthy diet, regular exercise, adequate sleep. Consider daily probiotics. Take medications as prescribed. Given and reviewed copy of ACP documents from [place] Secretary of State and encouraged to complete and return 

## 2015-06-17 NOTE — Progress Notes (Signed)
Pre visit review using our clinic review tool, if applicable. No additional management support is needed unless otherwise documented below in the visit note. 

## 2015-06-17 NOTE — Progress Notes (Signed)
Subjective:    Patient ID: Stacy Dennis, female    DOB: Aug 03, 1980, 35 y.o.   MRN: [id number]  Chief Complaint  Patient presents with  . Annual Exam    HPI Patient is in today for Annual Physical Exam.  Patient has some concerns with rash on LLQ abdomen and on upper leg under abdomen.  Patient also reports concerns with amenorrhea, had not been taking birth control pill daily but switched to another pill had a hiccup and missed on pill and since then has not had a menstrual cycle, and patient has also had some occasions of night sweats.  Last cycle was in December of 2016.Denies CP/palp/SOB/HA/congestion/fevers/GI or GU c/o. Taking meds as prescribed   Past Medical History  Diagnosis Date  . Endometriosis   . H/O varicella   . Hypercholesteremia     no meds  . Verrucous skin lesion 01/28/2012  . Hyperlipidemia 01/28/2012  . Preventative health care 01/28/2012  . Allergic state 01/28/2012  . Pregnancy related carpal tunnel syndrome, antepartum 2013  . Dermatitis 06/17/2015  . Abnormal menses 06/17/2015    Past Surgical History  Procedure Laterality Date  . Eye surgery  2012    lasik  . Myringotomy    . Cesarean section  10-10-11  . Cesarean section N/A 12/10/2013    Procedure: CESAREAN SECTION;  Surgeon: Zelphia Cairo, MD;  Location: [facility];  Service: Obstetrics;  Laterality: N/A;  Repeat edc 9/27    Family History  Problem Relation Age of Onset  . Anxiety disorder Mother   . Hyperlipidemia Mother   . Hypertension Mother   . Irritable bowel syndrome Mother     ?  . Kidney disease Father     stones  . Hyperlipidemia Father   . Thyroid disease Maternal Aunt   . Cancer Paternal Grandfather     lung- smoker  . Stroke Paternal Grandfather   . Aneurysm Paternal Grandfather   . Crohn's disease Paternal Aunt   . Hyperlipidemia Paternal Grandmother   . Hypertension Maternal Grandmother   . Heart disease Maternal Grandfather     Social History   Social History  .  Marital Status: Married    Spouse Name: N/A  . Number of Children: N/A  . Years of Education: N/A   Occupational History  . Not on file.   Social History Main Topics  . Smoking status: Never Smoker   . Smokeless tobacco: Never Used  . Alcohol Use: Yes     Comment: socially when not pregnant  . Drug Use: No  . Sexual Activity:    Partners: Male    Birth Control/ Protection: None   Other Topics Concern  . Not on file   Social History Narrative    Outpatient Prescriptions Prior to Visit  Medication Sig Dispense Refill  . Docosahexaenoic Acid (DHA PO) Take 1 capsule by mouth daily.     Marland Kitchen ibuprofen (ADVIL,MOTRIN) 600 MG tablet Take 1 tablet (600 mg total) by mouth every 6 (six) hours. 30 tablet 1  . Prenatal Vit-Fe Fumarate-FA (PRENATAL MULTIVITAMIN) TABS Take 1 tablet by mouth daily.     No facility-administered medications prior to visit.    Allergies  Allergen Reactions  . Amoxicillin Nausea And Vomiting  . Lactose Intolerance (Gi)     Review of Systems  Constitutional: Negative for fever and malaise/fatigue.  HENT: Negative for congestion.   Eyes: Negative for blurred vision.  Respiratory: Negative for shortness of breath.   Cardiovascular: Negative for  chest pain, palpitations and leg swelling.  Gastrointestinal: Negative for nausea, abdominal pain and blood in stool.  Genitourinary: Negative for dysuria and frequency.  Musculoskeletal: Negative for falls.  Skin: Positive for rash.  Neurological: Negative for dizziness, loss of consciousness and headaches.  Endo/Heme/Allergies: Negative for environmental allergies.  Psychiatric/Behavioral: Negative for depression. The patient is not nervous/anxious.        Objective:    Physical Exam  Constitutional: She is oriented to person, place, and time. She appears well-developed and well-nourished. No distress.  HENT:  Head: Normocephalic and atraumatic.  Eyes: Conjunctivae are normal.  Neck: Neck supple. No  thyromegaly present.  Cardiovascular: Normal rate, regular rhythm and normal heart sounds.   No murmur heard. Pulmonary/Chest: Effort normal and breath sounds normal. No respiratory distress.  Abdominal: Soft. Bowel sounds are normal. She exhibits no distension and no mass. There is no tenderness.  Musculoskeletal: She exhibits no edema.  Lymphadenopathy:    She has no cervical adenopathy.  Neurological: She is alert and oriented to person, place, and time.  Skin: Skin is warm and dry.  Psychiatric: She has a normal mood and affect. Her behavior is normal.    BP 112/72 mmHg  Pulse 82  Temp(Src) 98.3 F (36.8 C) (Oral)  Ht 5' (1.524 m)  Wt 128 lb (58.06 kg)  BMI 25.00 kg/m2  SpO2 97%  LMP 03/14/2015  Breastfeeding? No Wt Readings from Last 3 Encounters:  06/17/15 128 lb (58.06 kg)  01/28/15 125 lb (56.7 kg)  11/27/14 128 lb (58.06 kg)     Lab Results  Component Value Date   WBC 10.4 06/17/2015   HGB 13.1 06/17/2015   HCT 38.7 06/17/2015   PLT 327.0 06/17/2015   GLUCOSE 76 06/17/2015   CHOL 209* 06/17/2015   TRIG 92.0 06/17/2015   HDL 61.10 06/17/2015   LDLDIRECT 152.7 02/03/2012   LDLCALC 129* 06/17/2015   ALT 11 06/17/2015   AST 17 06/17/2015   NA 138 06/17/2015   K 3.8 06/17/2015   CL 104 06/17/2015   CREATININE 0.66 06/17/2015   BUN 13 06/17/2015   CO2 27 06/17/2015   TSH 1.49 06/17/2015    Lab Results  Component Value Date   TSH 1.49 06/17/2015   Lab Results  Component Value Date   WBC 10.4 06/17/2015   HGB 13.1 06/17/2015   HCT 38.7 06/17/2015   MCV 88.0 06/17/2015   PLT 327.0 06/17/2015   Lab Results  Component Value Date   NA 138 06/17/2015   K 3.8 06/17/2015   CO2 27 06/17/2015   GLUCOSE 76 06/17/2015   BUN 13 06/17/2015   CREATININE 0.66 06/17/2015   BILITOT 0.7 06/17/2015   ALKPHOS 36* 06/17/2015   AST 17 06/17/2015   ALT 11 06/17/2015   PROT 7.2 06/17/2015   ALBUMIN 4.3 06/17/2015   CALCIUM 10.3 06/17/2015   GFR 108.28  06/17/2015   Lab Results  Component Value Date   CHOL 209* 06/17/2015   Lab Results  Component Value Date   HDL 61.10 06/17/2015   Lab Results  Component Value Date   LDLCALC 129* 06/17/2015   Lab Results  Component Value Date   TRIG 92.0 06/17/2015   Lab Results  Component Value Date   CHOLHDL 3 06/17/2015   No results found for: HGBA1C     Assessment & Plan:   Problem List Items Addressed This Visit    Hx of thyroid disease    With some sweating/hot flashes, will check labs  and encouraged to eat small frequent meals and protein each time to see if that helps. If sweats and irregular menses persist will need to return to GYN for further work up.      Hyperlipidemia - Primary    Patient encouraged to maintain heart healthy diet, regular exercise, adequate sleep. Consider daily probiotics. Take medications as prescribed      Relevant Orders   Pregnancy, urine (Completed)   TSH (Completed)   Lipid panel (Completed)   CBC (Completed)   Comprehensive metabolic panel (Completed)   Preventative health care    Patient encouraged to maintain heart healthy diet, regular exercise, adequate sleep. Consider daily probiotics. Take medications as prescribed. Given and reviewed copy of ACP documents from [employer] [place] and encouraged to complete and return.       Relevant Orders   Pregnancy, urine (Completed)   TSH (Completed)   Lipid panel (Completed)   CBC (Completed)   Comprehensive metabolic panel (Completed)   T4, free (Completed)    Other Visit Diagnoses    Diaphoresis        Relevant Orders    T4, free (Completed)       I have discontinued Ms. Leanos's prenatal multivitamin, Docosahexaenoic Acid (DHA PO), and ibuprofen. I am also having her maintain her LARIN 24 FE.  Meds ordered this encounter  Medications  . LARIN 24 FE 1-20 MG-MCG(24) tablet    Sig: Take 1 tablet by mouth daily.     Refill:  4     Danise EdgeBLYTH, Brigida Scotti, MD

## 2015-06-17 NOTE — Assessment & Plan Note (Signed)
Irregular menses last in December, has missed some OCP doses and is having hot flashes. Check hcg

## 2015-06-17 NOTE — Patient Instructions (Addendum)
Apply Cedafil soap, Rub areas with [facility]. Take a bath with about a tablespoon of bleach once monthly if continues, please call and follow up with Dr. B.  Follow up with OBGYN if you continue having amenorrhea. NOW Probiotic Vitamin. Available at [employer].   Preventive Care for Adults, Female A healthy lifestyle and preventive care can promote health and wellness. Preventive health guidelines for women include the following key practices.  A routine yearly physical is a good way to check with your health care provider about your health and preventive screening. It is a chance to share any concerns and updates on your health and to receive a thorough exam.  Visit your dentist for a routine exam and preventive care every 6 months. Brush your teeth twice a day and floss once a day. Good oral hygiene prevents tooth decay and gum disease.  The frequency of eye exams is based on your age, health, family medical history, use of contact lenses, and other factors. Follow your health care provider's recommendations for frequency of eye exams.  Eat a healthy diet. Foods like vegetables, fruits, whole grains, low-fat dairy products, and lean protein foods contain the nutrients you need without too many calories. Decrease your intake of foods high in solid fats, added sugars, and salt. Eat the right amount of calories for you.Get information about a proper diet from your health care provider, if necessary.  Regular physical exercise is one of the most important things you can do for your health. Most adults should get at least 150 minutes of moderate-intensity exercise (any activity that increases your heart rate and causes you to sweat) each week. In addition, most adults need muscle-strengthening exercises on 2 or more days a week.  Maintain a healthy weight. The body mass index (BMI) is a screening tool to identify possible weight problems. It provides an estimate of body fat based on height  and weight. Your health care provider can find your BMI and can help you achieve or maintain a healthy weight.For adults 20 years and older:  A BMI below 18.5 is considered underweight.  A BMI of 18.5 to 24.9 is normal.  A BMI of 25 to 29.9 is considered overweight.  A BMI of 30 and above is considered obese.  Maintain normal blood lipids and cholesterol levels by exercising and minimizing your intake of saturated fat. Eat a balanced diet with plenty of fruit and vegetables. Blood tests for lipids and cholesterol should begin at age 87 and be repeated every 5 years. If your lipid or cholesterol levels are high, you are over 50, or you are at high risk for heart disease, you may need your cholesterol levels checked more frequently.Ongoing high lipid and cholesterol levels should be treated with medicines if diet and exercise are not working.  If you smoke, find out from your health care provider how to quit. If you do not use tobacco, do not start.  Lung cancer screening is recommended for adults aged 82-80 years who are at high risk for developing lung cancer because of a history of smoking. A yearly low-dose CT scan of the lungs is recommended for people who have at least a 30-pack-year history of smoking and are a current smoker or have quit within the past 15 years. A pack year of smoking is smoking an average of 1 pack of cigarettes a day for 1 year (for example: 1 pack a day for 30 years or 2 packs a day for 15 years).  Yearly screening should continue until the smoker has stopped smoking for at least 15 years. Yearly screening should be stopped for people who develop a health problem that would prevent them from having lung cancer treatment.  If you are pregnant, do not drink alcohol. If you are breastfeeding, be very cautious about drinking alcohol. If you are not pregnant and choose to drink alcohol, do not have more than 1 drink per day. One drink is considered to be 12 ounces (355 mL) of  beer, 5 ounces (148 mL) of wine, or 1.5 ounces (44 mL) of liquor.  Avoid use of street drugs. Do not share needles with anyone. Ask for help if you need support or instructions about stopping the use of drugs.  High blood pressure causes heart disease and increases the risk of stroke. Your blood pressure should be checked at least every 1 to 2 years. Ongoing high blood pressure should be treated with medicines if weight loss and exercise do not work.  If you are 61-29 years old, ask your health care provider if you should take aspirin to prevent strokes.  Diabetes screening is done by taking a blood sample to check your blood glucose level after you have not eaten for a certain period of time (fasting). If you are not overweight and you do not have risk factors for diabetes, you should be screened once every 3 years starting at age 86. If you are overweight or obese and you are 80-50 years of age, you should be screened for diabetes every year as part of your cardiovascular risk assessment.  Breast cancer screening is essential preventive care for women. You should practice "breast self-awareness." This means understanding the normal appearance and feel of your breasts and may include breast self-examination. Any changes detected, no matter how small, should be reported to a health care provider. Women in their 21s and 30s should have a clinical breast exam (CBE) by a health care provider as part of a regular health exam every 1 to 3 years. After age 71, women should have a CBE every year. Starting at age 67, women should consider having a mammogram (breast X-ray test) every year. Women who have a family history of breast cancer should talk to their health care provider about genetic screening. Women at a high risk of breast cancer should talk to their health care providers about having an MRI and a mammogram every year.  Breast cancer gene (BRCA)-related cancer risk assessment is recommended for women  who have family members with BRCA-related cancers. BRCA-related cancers include breast, ovarian, tubal, and peritoneal cancers. Having family members with these cancers may be associated with an increased risk for harmful changes (mutations) in the breast cancer genes BRCA1 and BRCA2. Results of the assessment will determine the need for genetic counseling and BRCA1 and BRCA2 testing.  Your health care provider may recommend that you be screened regularly for cancer of the pelvic organs (ovaries, uterus, and vagina). This screening involves a pelvic examination, including checking for microscopic changes to the surface of your cervix (Pap test). You may be encouraged to have this screening done every 3 years, beginning at age 26.  For women ages 60-65, health care providers may recommend pelvic exams and Pap testing every 3 years, or they may recommend the Pap and pelvic exam, combined with testing for human papilloma virus (HPV), every 5 years. Some types of HPV increase your risk of cervical cancer. Testing for HPV may also be done on women  of any age with unclear Pap test results.  Other health care providers may not recommend any screening for nonpregnant women who are considered low risk for pelvic cancer and who do not have symptoms. Ask your health care provider if a screening pelvic exam is right for you.  If you have had past treatment for cervical cancer or a condition that could lead to cancer, you need Pap tests and screening for cancer for at least 20 years after your treatment. If Pap tests have been discontinued, your risk factors (such as having a new sexual partner) need to be reassessed to determine if screening should resume. Some women have medical problems that increase the chance of getting cervical cancer. In these cases, your health care provider may recommend more frequent screening and Pap tests.  Colorectal cancer can be detected and often prevented. Most routine colorectal  cancer screening begins at the age of 84 years and continues through age 62 years. However, your health care provider may recommend screening at an earlier age if you have risk factors for colon cancer. On a yearly basis, your health care provider may provide home test kits to check for hidden blood in the stool. Use of a small camera at the end of a tube, to directly examine the colon (sigmoidoscopy or colonoscopy), can detect the earliest forms of colorectal cancer. Talk to your health care provider about this at age 62, when routine screening begins. Direct exam of the colon should be repeated every 5-10 years through age 61 years, unless early forms of precancerous polyps or small growths are found.  People who are at an increased risk for hepatitis B should be screened for this virus. You are considered at high risk for hepatitis B if:  You were born in a country where hepatitis B occurs often. Talk with your health care provider about which countries are considered high risk.  Your parents were born in a high-risk country and you have not received a shot to protect against hepatitis B (hepatitis B vaccine).  You have HIV or AIDS.  You use needles to inject street drugs.  You live with, or have sex with, someone who has hepatitis B.  You get hemodialysis treatment.  You take certain medicines for conditions like cancer, organ transplantation, and autoimmune conditions.  Hepatitis C blood testing is recommended for all people born from [birth year] through 1965 and any individual with known risks for hepatitis C.  Practice safe sex. Use condoms and avoid high-risk sexual practices to reduce the spread of sexually transmitted infections (STIs). STIs include gonorrhea, chlamydia, syphilis, trichomonas, herpes, HPV, and human immunodeficiency virus (HIV). Herpes, HIV, and HPV are viral illnesses that have no cure. They can result in disability, cancer, and death.  You should be screened for sexually  transmitted illnesses (STIs) including gonorrhea and chlamydia if:  You are sexually active and are younger than 24 years.  You are older than 24 years and your health care provider tells you that you are at risk for this type of infection.  Your sexual activity has changed since you were last screened and you are at an increased risk for chlamydia or gonorrhea. Ask your health care provider if you are at risk.  If you are at risk of being infected with HIV, it is recommended that you take a prescription medicine daily to prevent HIV infection. This is called preexposure prophylaxis (PrEP). You are considered at risk if:  You are sexually active and do not  regularly use condoms or know the HIV status of your partner(s).  You take drugs by injection.  You are sexually active with a partner who has HIV.  Talk with your health care provider about whether you are at high risk of being infected with HIV. If you choose to begin PrEP, you should first be tested for HIV. You should then be tested every 3 months for as long as you are taking PrEP.  Osteoporosis is a disease in which the bones lose minerals and strength with aging. This can result in serious bone fractures or breaks. The risk of osteoporosis can be identified using a bone density scan. Women ages 23 years and over and women at risk for fractures or osteoporosis should discuss screening with their health care providers. Ask your health care provider whether you should take a calcium supplement or vitamin D to reduce the rate of osteoporosis.  Menopause can be associated with physical symptoms and risks. Hormone replacement therapy is available to decrease symptoms and risks. You should talk to your health care provider about whether hormone replacement therapy is right for you.  Use sunscreen. Apply sunscreen liberally and repeatedly throughout the day. You should seek shade when your shadow is shorter than you. Protect yourself by  wearing long sleeves, pants, a wide-brimmed hat, and sunglasses year round, whenever you are outdoors.  Once a month, do a whole body skin exam, using a mirror to look at the skin on your back. Tell your health care provider of new moles, moles that have irregular borders, moles that are larger than a pencil eraser, or moles that have changed in shape or color.  Stay current with required vaccines (immunizations).  Influenza vaccine. All adults should be immunized every year.  Tetanus, diphtheria, and acellular pertussis (Td, Tdap) vaccine. Pregnant women should receive 1 dose of Tdap vaccine during each pregnancy. The dose should be obtained regardless of the length of time since the last dose. Immunization is preferred during the 27th-36th week of gestation. An adult who has not previously received Tdap or who does not know her vaccine status should receive 1 dose of Tdap. This initial dose should be followed by tetanus and diphtheria toxoids (Td) booster doses every 10 years. Adults with an unknown or incomplete history of completing a 3-dose immunization series with Td-containing vaccines should begin or complete a primary immunization series including a Tdap dose. Adults should receive a Td booster every 10 years.  Varicella vaccine. An adult without evidence of immunity to varicella should receive 2 doses or a second dose if she has previously received 1 dose. Pregnant females who do not have evidence of immunity should receive the first dose after pregnancy. This first dose should be obtained before leaving the health care facility. The second dose should be obtained 4-8 weeks after the first dose.  Human papillomavirus (HPV) vaccine. Females aged 13-26 years who have not received the vaccine previously should obtain the 3-dose series. The vaccine is not recommended for use in pregnant females. However, pregnancy testing is not needed before receiving a dose. If a female is found to be pregnant  after receiving a dose, no treatment is needed. In that case, the remaining doses should be delayed until after the pregnancy. Immunization is recommended for any person with an immunocompromised condition through the age of 23 years if she did not get any or all doses earlier. During the 3-dose series, the second dose should be obtained 4-8 weeks after the first  dose. The third dose should be obtained 24 weeks after the first dose and 16 weeks after the second dose.  Zoster vaccine. One dose is recommended for adults aged 47 years or older unless certain conditions are present.  Measles, mumps, and rubella (MMR) vaccine. Adults born before [birth year] generally are considered immune to measles and mumps. Adults born in [birth year] or later should have 1 or more doses of MMR vaccine unless there is a contraindication to the vaccine or there is laboratory evidence of immunity to each of the three diseases. A routine second dose of MMR vaccine should be obtained at least 28 days after the first dose for students attending postsecondary schools, health care workers, or international travelers. People who received inactivated measles vaccine or an unknown type of measles vaccine during 1963-1967 should receive 2 doses of MMR vaccine. People who received inactivated mumps vaccine or an unknown type of mumps vaccine before 1979 and are at high risk for mumps infection should consider immunization with 2 doses of MMR vaccine. For females of childbearing age, rubella immunity should be determined. If there is no evidence of immunity, females who are not pregnant should be vaccinated. If there is no evidence of immunity, females who are pregnant should delay immunization until after pregnancy. Unvaccinated health care workers born before [birth year] who lack laboratory evidence of measles, mumps, or rubella immunity or laboratory confirmation of disease should consider measles and mumps immunization with 2 doses of MMR vaccine or rubella  immunization with 1 dose of MMR vaccine.  Pneumococcal 13-valent conjugate (PCV13) vaccine. When indicated, a person who is uncertain of his immunization history and has no record of immunization should receive the PCV13 vaccine. All adults 30 years of age and older should receive this vaccine. An adult aged 75 years or older who has certain medical conditions and has not been previously immunized should receive 1 dose of PCV13 vaccine. This PCV13 should be followed with a dose of pneumococcal polysaccharide (PPSV23) vaccine. Adults who are at high risk for pneumococcal disease should obtain the PPSV23 vaccine at least 8 weeks after the dose of PCV13 vaccine. Adults older than 35 years of age who have normal immune system function should obtain the PPSV23 vaccine dose at least 1 year after the dose of PCV13 vaccine.  Pneumococcal polysaccharide (PPSV23) vaccine. When PCV13 is also indicated, PCV13 should be obtained first. All adults aged 80 years and older should be immunized. An adult younger than age 33 years who has certain medical conditions should be immunized. Any person who resides in a nursing home or long-term care facility should be immunized. An adult smoker should be immunized. People with an immunocompromised condition and certain other conditions should receive both PCV13 and PPSV23 vaccines. People with human immunodeficiency virus (HIV) infection should be immunized as soon as possible after diagnosis. Immunization during chemotherapy or radiation therapy should be avoided. Routine use of PPSV23 vaccine is not recommended for American Indians, [place], or people younger than 65 years unless there are medical conditions that require PPSV23 vaccine. When indicated, people who have unknown immunization and have no record of immunization should receive PPSV23 vaccine. One-time revaccination 5 years after the first dose of PPSV23 is recommended for people aged 19-64 years who have chronic  kidney failure, nephrotic syndrome, asplenia, or immunocompromised conditions. People who received 1-2 doses of PPSV23 before age 9 years should receive another dose of PPSV23 vaccine at age 26 years or later if at least 5 years have  passed since the previous dose. Doses of PPSV23 are not needed for people immunized with PPSV23 at or after age 72 years.  Meningococcal vaccine. Adults with asplenia or persistent complement component deficiencies should receive 2 doses of quadrivalent meningococcal conjugate (MenACWY-D) vaccine. The doses should be obtained at least 2 months apart. Microbiologists working with certain meningococcal bacteria, [employer] recruits, people at risk during an outbreak, and people who travel to or live in countries with a high rate of meningitis should be immunized. A first-year college student up through age 89 years who is living in a residence hall should receive a dose if she did not receive a dose on or after her 16th birthday. Adults who have certain high-risk conditions should receive one or more doses of vaccine.  Hepatitis A vaccine. Adults who wish to be protected from this disease, have certain high-risk conditions, work with hepatitis A-infected animals, work in hepatitis A research labs, or travel to or work in countries with a high rate of hepatitis A should be immunized. Adults who were previously unvaccinated and who anticipate close contact with an international adoptee during the first 60 days after arrival in the [place] from a country with a high rate of hepatitis A should be immunized.  Hepatitis B vaccine. Adults who wish to be protected from this disease, have certain high-risk conditions, may be exposed to blood or other infectious body fluids, are household contacts or sex partners of hepatitis B positive people, are clients or workers in certain care facilities, or travel to or work in countries with a high rate of hepatitis B should be  immunized.  Haemophilus influenzae type b (Hib) vaccine. A previously unvaccinated person with asplenia or sickle cell disease or having a scheduled splenectomy should receive 1 dose of Hib vaccine. Regardless of previous immunization, a recipient of a hematopoietic stem cell transplant should receive a 3-dose series 6-12 months after her successful transplant. Hib vaccine is not recommended for adults with HIV infection. Preventive Services / Frequency Ages 100 to 75 years  Blood pressure check.** / Every 3-5 years.  Lipid and cholesterol check.** / Every 5 years beginning at age 33.  Clinical breast exam.** / Every 3 years for women in their 16s and 53s.  BRCA-related cancer risk assessment.** / For women who have family members with a BRCA-related cancer (breast, ovarian, tubal, or peritoneal cancers).  Pap test.** / Every 2 years from ages 84 through 19. Every 3 years starting at age 27 through age 50 or 54 with a history of 3 consecutive normal Pap tests.  HPV screening.** / Every 3 years from ages 47 through ages 30 to 50 with a history of 3 consecutive normal Pap tests.  Hepatitis C blood test.** / For any individual with known risks for hepatitis C.  Skin self-exam. / Monthly.  Influenza vaccine. / Every year.  Tetanus, diphtheria, and acellular pertussis (Tdap, Td) vaccine.** / Consult your health care provider. Pregnant women should receive 1 dose of Tdap vaccine during each pregnancy. 1 dose of Td every 10 years.  Varicella vaccine.** / Consult your health care provider. Pregnant females who do not have evidence of immunity should receive the first dose after pregnancy.  HPV vaccine. / 3 doses over 6 months, if 43 and younger. The vaccine is not recommended for use in pregnant females. However, pregnancy testing is not needed before receiving a dose.  Measles, mumps, rubella (MMR) vaccine.** / You need at least 1 dose of MMR if you  were born in [birth year] or later. You may also need  a 2nd dose. For females of childbearing age, rubella immunity should be determined. If there is no evidence of immunity, females who are not pregnant should be vaccinated. If there is no evidence of immunity, females who are pregnant should delay immunization until after pregnancy.  Pneumococcal 13-valent conjugate (PCV13) vaccine.** / Consult your health care provider.  Pneumococcal polysaccharide (PPSV23) vaccine.** / 1 to 2 doses if you smoke cigarettes or if you have certain conditions.  Meningococcal vaccine.** / 1 dose if you are age 11 to 67 years and a Market researcher living in a residence hall, or have one of several medical conditions, you need to get vaccinated against meningococcal disease. You may also need additional booster doses.  Hepatitis A vaccine.** / Consult your health care provider.  Hepatitis B vaccine.** / Consult your health care provider.  Haemophilus influenzae type b (Hib) vaccine.** / Consult your health care provider. Ages 60 to 5 years  Blood pressure check.** / Every year.  Lipid and cholesterol check.** / Every 5 years beginning at age 24 years.  Lung cancer screening. / Every year if you are aged 7-80 years and have a 30-pack-year history of smoking and currently smoke or have quit within the past 15 years. Yearly screening is stopped once you have quit smoking for at least 15 years or develop a health problem that would prevent you from having lung cancer treatment.  Clinical breast exam.** / Every year after age 23 years.  BRCA-related cancer risk assessment.** / For women who have family members with a BRCA-related cancer (breast, ovarian, tubal, or peritoneal cancers).  Mammogram.** / Every year beginning at age 90 years and continuing for as long as you are in good health. Consult with your health care provider.  Pap test.** / Every 3 years starting at age 33 years through age 71 or 36 years with a history of 3 consecutive normal Pap  tests.  HPV screening.** / Every 3 years from ages 82 years through ages 96 to 52 years with a history of 3 consecutive normal Pap tests.  Fecal occult blood test (FOBT) of stool. / Every year beginning at age 71 years and continuing until age 30 years. You may not need to do this test if you get a colonoscopy every 10 years.  Flexible sigmoidoscopy or colonoscopy.** / Every 5 years for a flexible sigmoidoscopy or every 10 years for a colonoscopy beginning at age 72 years and continuing until age 39 years.  Hepatitis C blood test.** / For all people born from [birth year] through 1965 and any individual with known risks for hepatitis C.  Skin self-exam. / Monthly.  Influenza vaccine. / Every year.  Tetanus, diphtheria, and acellular pertussis (Tdap/Td) vaccine.** / Consult your health care provider. Pregnant women should receive 1 dose of Tdap vaccine during each pregnancy. 1 dose of Td every 10 years.  Varicella vaccine.** / Consult your health care provider. Pregnant females who do not have evidence of immunity should receive the first dose after pregnancy.  Zoster vaccine.** / 1 dose for adults aged 26 years or older.  Measles, mumps, rubella (MMR) vaccine.** / You need at least 1 dose of MMR if you were born in 1957 or later. You may also need a second dose. For females of childbearing age, rubella immunity should be determined. If there is no evidence of immunity, females who are not pregnant should be vaccinated. If there is no evidence  of immunity, females who are pregnant should delay immunization until after pregnancy.  Pneumococcal 13-valent conjugate (PCV13) vaccine.** / Consult your health care provider.  Pneumococcal polysaccharide (PPSV23) vaccine.** / 1 to 2 doses if you smoke cigarettes or if you have certain conditions.  Meningococcal vaccine.** / Consult your health care provider.  Hepatitis A vaccine.** / Consult your health care provider.  Hepatitis B vaccine.** / Consult  your health care provider.  Haemophilus influenzae type b (Hib) vaccine.** / Consult your health care provider. Ages 83 years and over  Blood pressure check.** / Every year.  Lipid and cholesterol check.** / Every 5 years beginning at age 35 years.  Lung cancer screening. / Every year if you are aged 74-80 years and have a 30-pack-year history of smoking and currently smoke or have quit within the past 15 years. Yearly screening is stopped once you have quit smoking for at least 15 years or develop a health problem that would prevent you from having lung cancer treatment.  Clinical breast exam.** / Every year after age 101 years.  BRCA-related cancer risk assessment.** / For women who have family members with a BRCA-related cancer (breast, ovarian, tubal, or peritoneal cancers).  Mammogram.** / Every year beginning at age 66 years and continuing for as long as you are in good health. Consult with your health care provider.  Pap test.** / Every 3 years starting at age 33 years through age 54 or 42 years with 3 consecutive normal Pap tests. Testing can be stopped between 65 and 70 years with 3 consecutive normal Pap tests and no abnormal Pap or HPV tests in the past 10 years.  HPV screening.** / Every 3 years from ages 34 years through ages 7 or 103 years with a history of 3 consecutive normal Pap tests. Testing can be stopped between 65 and 70 years with 3 consecutive normal Pap tests and no abnormal Pap or HPV tests in the past 10 years.  Fecal occult blood test (FOBT) of stool. / Every year beginning at age 35 years and continuing until age 22 years. You may not need to do this test if you get a colonoscopy every 10 years.  Flexible sigmoidoscopy or colonoscopy.** / Every 5 years for a flexible sigmoidoscopy or every 10 years for a colonoscopy beginning at age 55 years and continuing until age 74 years.  Hepatitis C blood test.** / For all people born from [birth year] through 1965 and any  individual with known risks for hepatitis C.  Osteoporosis screening.** / A one-time screening for women ages 90 years and over and women at risk for fractures or osteoporosis.  Skin self-exam. / Monthly.  Influenza vaccine. / Every year.  Tetanus, diphtheria, and acellular pertussis (Tdap/Td) vaccine.** / 1 dose of Td every 10 years.  Varicella vaccine.** / Consult your health care provider.  Zoster vaccine.** / 1 dose for adults aged 19 years or older.  Pneumococcal 13-valent conjugate (PCV13) vaccine.** / Consult your health care provider.  Pneumococcal polysaccharide (PPSV23) vaccine.** / 1 dose for all adults aged 85 years and older.  Meningococcal vaccine.** / Consult your health care provider.  Hepatitis A vaccine.** / Consult your health care provider.  Hepatitis B vaccine.** / Consult your health care provider.  Haemophilus influenzae type b (Hib) vaccine.** / Consult your health care provider. ** Family history and personal history of risk and conditions may change your health care provider's recommendations.   This information is not intended to replace advice given to you  by your health care provider. Make sure you discuss any questions you have with your health care provider.   Document Released: 05/04/2001 Document Revised: 03/29/2014 Document Reviewed: 08/03/2010 Elsevier Interactive Patient Education [employer].

## 2015-06-17 NOTE — Assessment & Plan Note (Signed)
New for past 3 days with small, mildly itchy red bumps on stomach, fronts of upper legs since a shift in hospital, try Cetaphil soap, Witch Hazel Astringent and bath one weekly in bath with 1 tbsp of bleach if persists will need referral to dermatology

## 2015-06-18 DIAGNOSIS — Z8639 Personal history of other endocrine, nutritional and metabolic disease: Secondary | ICD-10-CM | POA: Insufficient documentation

## 2015-06-18 LAB — COMPREHENSIVE METABOLIC PANEL
ALT: 11 U/L (ref 0–35)
AST: 17 U/L (ref 0–37)
Albumin: 4.3 g/dL (ref 3.5–5.2)
Alkaline Phosphatase: 36 U/L — ABNORMAL LOW (ref 39–117)
BUN: 13 mg/dL (ref 6–23)
CHLORIDE: 104 meq/L (ref 96–112)
CO2: 27 mEq/L (ref 19–32)
CREATININE: 0.66 mg/dL (ref 0.40–1.20)
Calcium: 10.3 mg/dL (ref 8.4–10.5)
GFR: 108.28 mL/min (ref >60.00)
Glucose, Bld: 76 mg/dL (ref 70–99)
Potassium: 3.8 mEq/L (ref 3.5–5.1)
SODIUM: 138 meq/L (ref 135–145)
TOTAL PROTEIN: 7.2 g/dL (ref 6.0–8.3)
Total Bilirubin: 0.7 mg/dL (ref 0.2–1.2)

## 2015-06-18 LAB — TSH: TSH: 1.49 u[IU]/mL (ref 0.35–4.50)

## 2015-06-18 LAB — CBC
HCT: 38.7 % (ref 36.0–46.0)
Hemoglobin: 13.1 g/dL (ref 12.0–15.0)
MCHC: 33.8 g/dL (ref 30.0–36.0)
MCV: 88 fl (ref 78.0–100.0)
Platelets: 327 10*3/uL (ref 150.0–400.0)
RBC: 4.4 Mil/uL (ref 3.87–5.11)
RDW: 12.3 % (ref 11.5–15.5)
WBC: 10.4 10*3/uL (ref 4.0–10.5)

## 2015-06-18 LAB — LIPID PANEL
CHOL/HDL RATIO: 3
CHOLESTEROL: 209 mg/dL — AB (ref 0–200)
HDL: 61.1 mg/dL (ref >39.00)
LDL CALC: 129 mg/dL — AB (ref 0–99)
NonHDL: 147.85
Triglycerides: 92 mg/dL (ref 0.0–149.0)
VLDL: 18.4 mg/dL (ref 0.0–40.0)

## 2015-06-18 LAB — PREGNANCY, URINE: Preg Test, Ur: NEGATIVE

## 2015-06-18 LAB — T4, FREE: Free T4: 0.85 ng/dL (ref 0.60–1.60)

## 2015-06-18 NOTE — Assessment & Plan Note (Signed)
With some sweating/hot flashes, will check labs and encouraged to eat small frequent meals and protein each time to see if that helps. If sweats and irregular menses persist will need to return to GYN for further work up.

## 2015-06-19 ENCOUNTER — Encounter: Payer: Self-pay | Admitting: Family Medicine

## 2015-06-30 DIAGNOSIS — N911 Secondary amenorrhea: Secondary | ICD-10-CM | POA: Diagnosis not present

## 2015-06-30 DIAGNOSIS — M545 Low back pain: Secondary | ICD-10-CM | POA: Diagnosis not present

## 2015-07-09 MED FILL — LARIN 24 FE 1 MG-20 MCG TAB: 1-20 | 84 days supply | Qty: 84 | Fill #2

## 2015-07-16 DIAGNOSIS — N801 Endometriosis of ovary: Secondary | ICD-10-CM | POA: Diagnosis not present

## 2015-07-18 MED FILL — CITALOPRAM HBR 10 MG TABLET: 10 | 30 days supply | Qty: 30 | Fill #0

## 2015-07-30 DIAGNOSIS — D696 Thrombocytopenia, unspecified: Secondary | ICD-10-CM | POA: Diagnosis not present

## 2015-09-29 ENCOUNTER — Encounter: Payer: 59 | Admitting: Family Medicine

## 2015-10-13 MED FILL — LARIN 24 FE 1 MG-20 MCG TAB: 1-20 | 84 days supply | Qty: 84 | Fill #3

## 2015-12-30 MED FILL — LARIN 24 FE 1 MG-20 MCG TAB: 1-20 | 84 days supply | Qty: 84 | Fill #4

## 2016-01-27 DIAGNOSIS — Z6826 Body mass index (BMI) 26.0-26.9, adult: Secondary | ICD-10-CM | POA: Diagnosis not present

## 2016-01-27 DIAGNOSIS — Z01419 Encounter for gynecological examination (general) (routine) without abnormal findings: Secondary | ICD-10-CM | POA: Diagnosis not present

## 2016-02-04 DIAGNOSIS — M9905 Segmental and somatic dysfunction of pelvic region: Secondary | ICD-10-CM | POA: Diagnosis not present

## 2016-02-04 DIAGNOSIS — M9904 Segmental and somatic dysfunction of sacral region: Secondary | ICD-10-CM | POA: Diagnosis not present

## 2016-02-04 DIAGNOSIS — M791 Myalgia: Secondary | ICD-10-CM | POA: Diagnosis not present

## 2016-02-04 DIAGNOSIS — M9903 Segmental and somatic dysfunction of lumbar region: Secondary | ICD-10-CM | POA: Diagnosis not present

## 2016-02-05 DIAGNOSIS — M791 Myalgia: Secondary | ICD-10-CM | POA: Diagnosis not present

## 2016-02-05 DIAGNOSIS — M9903 Segmental and somatic dysfunction of lumbar region: Secondary | ICD-10-CM | POA: Diagnosis not present

## 2016-02-05 DIAGNOSIS — M9904 Segmental and somatic dysfunction of sacral region: Secondary | ICD-10-CM | POA: Diagnosis not present

## 2016-02-05 DIAGNOSIS — M9905 Segmental and somatic dysfunction of pelvic region: Secondary | ICD-10-CM | POA: Diagnosis not present

## 2016-02-17 DIAGNOSIS — M9903 Segmental and somatic dysfunction of lumbar region: Secondary | ICD-10-CM | POA: Diagnosis not present

## 2016-02-17 DIAGNOSIS — M9904 Segmental and somatic dysfunction of sacral region: Secondary | ICD-10-CM | POA: Diagnosis not present

## 2016-02-17 DIAGNOSIS — M791 Myalgia: Secondary | ICD-10-CM | POA: Diagnosis not present

## 2016-02-17 DIAGNOSIS — M9905 Segmental and somatic dysfunction of pelvic region: Secondary | ICD-10-CM | POA: Diagnosis not present

## 2016-02-19 DIAGNOSIS — M9904 Segmental and somatic dysfunction of sacral region: Secondary | ICD-10-CM | POA: Diagnosis not present

## 2016-02-19 DIAGNOSIS — M791 Myalgia: Secondary | ICD-10-CM | POA: Diagnosis not present

## 2016-02-19 DIAGNOSIS — M9905 Segmental and somatic dysfunction of pelvic region: Secondary | ICD-10-CM | POA: Diagnosis not present

## 2016-02-19 DIAGNOSIS — M9903 Segmental and somatic dysfunction of lumbar region: Secondary | ICD-10-CM | POA: Diagnosis not present

## 2016-03-18 MED FILL — LARIN 24 FE 1 MG-20 MCG TAB: 1-20 | 84 days supply | Qty: 84 | Fill #0

## 2016-06-21 ENCOUNTER — Encounter: Payer: 59 | Admitting: Family Medicine

## 2016-06-21 MED FILL — FLUCONAZOLE 150 MG TABLET: 150 | 1 days supply | Qty: 1 | Fill #0

## 2016-06-21 MED FILL — LARIN 24 FE 1 MG-20 MCG TAB: 1-20 | 84 days supply | Qty: 84 | Fill #1

## 2016-07-06 ENCOUNTER — Ambulatory Visit (INDEPENDENT_AMBULATORY_CARE_PROVIDER_SITE_OTHER): Payer: 59 | Admitting: Family Medicine

## 2016-07-06 ENCOUNTER — Encounter: Payer: Self-pay | Admitting: Family Medicine

## 2016-07-06 ENCOUNTER — Encounter: Payer: 59 | Admitting: Family Medicine

## 2016-07-06 VITALS — BP 118/62 | HR 71 | Temp 98.4°F | Resp 18 | Ht 59.5 in | Wt 131.6 lb

## 2016-07-06 DIAGNOSIS — Z0000 Encounter for general adult medical examination without abnormal findings: Secondary | ICD-10-CM | POA: Diagnosis not present

## 2016-07-06 DIAGNOSIS — F419 Anxiety disorder, unspecified: Secondary | ICD-10-CM | POA: Diagnosis not present

## 2016-07-06 DIAGNOSIS — E785 Hyperlipidemia, unspecified: Secondary | ICD-10-CM | POA: Diagnosis not present

## 2016-07-06 DIAGNOSIS — Z8639 Personal history of other endocrine, nutritional and metabolic disease: Secondary | ICD-10-CM

## 2016-07-06 HISTORY — DX: Anxiety disorder, unspecified: F41.9

## 2016-07-06 LAB — CBC
HCT: 39.5 % (ref 36.0–46.0)
Hemoglobin: 13.3 g/dL (ref 12.0–15.0)
MCHC: 33.6 g/dL (ref 30.0–36.0)
MCV: 88.4 fl (ref 78.0–100.0)
Platelets: 317 10*3/uL (ref 150.0–400.0)
RBC: 4.47 Mil/uL (ref 3.87–5.11)
RDW: 12.8 % (ref 11.5–15.5)
WBC: 8.6 10*3/uL (ref 4.0–10.5)

## 2016-07-06 LAB — COMPREHENSIVE METABOLIC PANEL
ALT: 15 U/L (ref 0–35)
AST: 18 U/L (ref 0–37)
Albumin: 4.5 g/dL (ref 3.5–5.2)
Alkaline Phosphatase: 34 U/L — ABNORMAL LOW (ref 39–117)
BUN: 14 mg/dL (ref 6–23)
CO2: 29 mEq/L (ref 19–32)
Calcium: 9.6 mg/dL (ref 8.4–10.5)
Chloride: 102 mEq/L (ref 96–112)
Creatinine, Ser: 0.74 mg/dL (ref 0.40–1.20)
GFR: 94.32 mL/min (ref >60.00)
Glucose, Bld: 99 mg/dL (ref 70–99)
Potassium: 3.9 mEq/L (ref 3.5–5.1)
SODIUM: 137 meq/L (ref 135–145)
TOTAL PROTEIN: 7 g/dL (ref 6.0–8.3)
Total Bilirubin: 0.4 mg/dL (ref 0.2–1.2)

## 2016-07-06 LAB — LIPID PANEL
CHOL/HDL RATIO: 4
Cholesterol: 214 mg/dL — ABNORMAL HIGH (ref 0–200)
HDL: 60.4 mg/dL (ref >39.00)
LDL Cholesterol: 122 mg/dL — ABNORMAL HIGH (ref 0–99)
NONHDL: 154.03
Triglycerides: 162 mg/dL — ABNORMAL HIGH (ref 0.0–149.0)
VLDL: 32.4 mg/dL (ref 0.0–40.0)

## 2016-07-06 LAB — TSH: TSH: 1.21 u[IU]/mL (ref 0.35–4.50)

## 2016-07-06 MED ORDER — ALPRAZOLAM 0.25 MG PO TABS
ORAL_TABLET | ORAL | 1 refills | Status: DC
Start: 2016-07-06 — End: 2018-07-24

## 2016-07-06 MED ORDER — ESCITALOPRAM OXALATE 10 MG PO TABS: ORAL_TABLET | ORAL | 2 refills | Status: DC

## 2016-07-06 NOTE — Assessment & Plan Note (Signed)
Encouraged heart healthy diet, increase exercise, avoid trans fats, consider a krill oil cap daily 

## 2016-07-06 NOTE — Assessment & Plan Note (Addendum)
Strong FH of hi anxiety with a mom with GAD, patient is struggling with increasing anxiety due to her 36 year old son's ADHD abd ODD. She is setting up counseling for her son with [facility], K[facility] Passey.soon. She is struggling her self with agitation, anxiety, racing thoughts. She is trying to figure out what she has to do next. Start Escitalopram 5 mg po qd then increase to 10 mg daily and can use rare alprazolam 0.25 mg tab prn for anxiety disorder. Spent greater than 35 minutes of a 45 minute appointment in counseling with patient today to try and develop a plan for her. She is given phone number of [facility] and asked to consider personal and couples counseling

## 2016-07-06 NOTE — Patient Instructions (Signed)
Preventive Care 18-39 Years, Female Preventive care refers to lifestyle choices and visits with your health care provider that can promote health and wellness. What does preventive care include?  A yearly physical exam. This is also called an annual well check.  Dental exams once or twice a year.  Routine eye exams. Ask your health care provider how often you should have your eyes checked.  Personal lifestyle choices, including:  Daily care of your teeth and gums.  Regular physical activity.  Eating a healthy diet.  Avoiding tobacco and drug use.  Limiting alcohol use.  Practicing safe sex.  Taking vitamin and mineral supplements as recommended by your health care provider. What happens during an annual well check? The services and screenings done by your health care provider during your annual well check will depend on your age, overall health, lifestyle risk factors, and family history of disease. Counseling  Your health care provider may ask you questions about your:  Alcohol use.  Tobacco use.  Drug use.  Emotional well-being.  Home and relationship well-being.  Sexual activity.  Eating habits.  Work and work environment.  Method of birth control.  Menstrual cycle.  Pregnancy history. Screening  You may have the following tests or measurements:  Height, weight, and BMI.  Diabetes screening. This is done by checking your blood sugar (glucose) after you have not eaten for a while (fasting).  Blood pressure.  Lipid and cholesterol levels. These may be checked every 5 years starting at age 20.  Skin check.  Hepatitis C blood test.  Hepatitis B blood test.  Sexually transmitted disease (STD) testing.  BRCA-related cancer screening. This may be done if you have a family history of breast, ovarian, tubal, or peritoneal cancers.  Pelvic exam and Pap test. This may be done every 3 years starting at age 21. Starting at age 30, this may be done every 5  years if you have a Pap test in combination with an HPV test. Discuss your test results, treatment options, and if necessary, the need for more tests with your health care provider. Vaccines  Your health care provider may recommend certain vaccines, such as:  Influenza vaccine. This is recommended every year.  Tetanus, diphtheria, and acellular pertussis (Tdap, Td) vaccine. You may need a Td booster every 10 years.  Varicella vaccine. You may need this if you have not been vaccinated.  HPV vaccine. If you are 26 or younger, you may need three doses over 6 months.  Measles, mumps, and rubella (MMR) vaccine. You may need at least one dose of MMR. You may also need a second dose.  Pneumococcal 13-valent conjugate (PCV13) vaccine. You may need this if you have certain conditions and were not previously vaccinated.  Pneumococcal polysaccharide (PPSV23) vaccine. You may need one or two doses if you smoke cigarettes or if you have certain conditions.  Meningococcal vaccine. One dose is recommended if you are age 19-21 years and a first-year college student living in a residence hall, or if you have one of several medical conditions. You may also need additional booster doses.  Hepatitis A vaccine. You may need this if you have certain conditions or if you travel or work in places where you may be exposed to hepatitis A.  Hepatitis B vaccine. You may need this if you have certain conditions or if you travel or work in places where you may be exposed to hepatitis B.  Haemophilus influenzae type b (Hib) vaccine. You may need this   if you have certain risk factors. Talk to your health care provider about which screenings and vaccines you need and how often you need them. This information is not intended to replace advice given to you by your health care provider. Make sure you discuss any questions you have with your health care provider. Document Released: 05/04/2001 Document Revised: 11/26/2015  Document Reviewed: 01/07/2015 Elsevier Interactive Patient Education  2017 [employer].

## 2016-07-06 NOTE — Progress Notes (Signed)
Pre visit review using our clinic review tool, if applicable. No additional management support is needed unless otherwise documented below in the visit note. 

## 2016-07-06 NOTE — Assessment & Plan Note (Signed)
Patient encouraged to maintain heart healthy diet, regular exercise, adequate sleep. Consider daily probiotics. Take medications as prescribed. Labs today 

## 2016-07-06 NOTE — Assessment & Plan Note (Signed)
Check TSH today

## 2016-07-06 NOTE — Progress Notes (Signed)
Subjective:  I acted as a Neurosurgeon for Dr. Abner Greenspan. [place], [place]   Patient ID: Stacy Dennis, female    DOB: 13-Aug-1980, 36 y.o.   MRN: [id number]  Chief Complaint  Patient presents with  . Annual Exam    Hyperlipidemia  This is a new problem. The problem is controlled. Pertinent negatives include no chest pain or shortness of breath.  Anxiety  Presents for initial visit. The problem has been gradually worsening. Symptoms include excessive worry and nervous/anxious behavior. Patient reports no chest pain, palpitations or shortness of breath. The quality of sleep is fair.      Patient is in today for an annual exam follow ing up on hyperlipidemia and other medical concerns. Patient wants to discuss her anxiety. States her sleep is fair. 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 She is managing a new diagnosis of ADHD and ODD with her 26-year-old and has been arranging consultations and treatment for that. She is struggling with the division of labor with her husband within the home. She is having trouble with anxiety, irritability and agitation. She notes her mother has a very high level of generalized anxiety and up to this point she had felt she could manage it. She endorses anhedonia. She physically feels well. She denies palpitations, shortness of breath or chest pain. Denies CP/palp/SOB/HA/congestion/fevers/GI or GU c/o. Taking meds as prescribed  Patient Care Team: Bradd Canary, MD as PCP - General (Family Medicine) Zelphia Cairo, MD as Consulting Physician (Obstetrics and Gynecology)   Past Medical History:  Diagnosis Date  . Abnormal menses 06/17/2015  . Allergic state 01/28/2012  . Anxiety 07/06/2016  . Dermatitis 06/17/2015  . Endometriosis   . H/O varicella   . Hypercholesteremia    no meds  . Hyperlipidemia 01/28/2012  . Pregnancy related carpal tunnel syndrome, antepartum 2013  . Preventative health care 01/28/2012  . Verrucous skin lesion  01/28/2012    Past Surgical History:  Procedure Laterality Date  . CESAREAN SECTION  10-10-11  . CESAREAN SECTION N/A 12/10/2013   Procedure: CESAREAN SECTION;  Surgeon: Zelphia Cairo, MD;  Location: [facility];  Service: Obstetrics;  Laterality: N/A;  Repeat edc 9/27  . EYE SURGERY  2012   lasik  . MYRINGOTOMY      Family History  Problem Relation Age of Onset  . Anxiety disorder Mother   . Hyperlipidemia Mother   . Hypertension Mother   . Irritable bowel syndrome Mother     ?  . Kidney disease Father     stones  . Hyperlipidemia Father   . Cancer Paternal Grandfather     lung- smoker  . Stroke Paternal Grandfather   . Aneurysm Paternal Grandfather   . Hyperlipidemia Paternal Grandmother   . Hypertension Maternal Grandmother   . Heart disease Maternal Grandfather   . Thyroid disease Maternal Aunt   . Crohn's disease Paternal Aunt     Social History   Social History  . Marital status: Married    Spouse name: N/A  . Number of children: N/A  . Years of education: N/A   Occupational History  . Not on file.   Social History Main Topics  . Smoking status: Never Smoker  . Smokeless tobacco: Never Used  . Alcohol use Yes     Comment: socially when not pregnant  . Drug use: No  . Sexual activity: Yes    Partners: Male    Birth control/ protection: None   Other  Topics Concern  . Not on file   Social History Narrative  . No narrative on file    Outpatient Medications Prior to Visit  Medication Sig Dispense Refill  . LARIN 24 FE 1-20 MG-MCG(24) tablet Take 1 tablet by mouth daily.   4   No facility-administered medications prior to visit.     Allergies  Allergen Reactions  . Amoxicillin Nausea And Vomiting  . Lactose Intolerance (Gi)     Review of Systems  Constitutional: Negative for fever and malaise/fatigue.  HENT: Negative for congestion.   Eyes: Negative for blurred vision.  Respiratory: Negative for cough and shortness of breath.     Cardiovascular: Negative for chest pain, palpitations and leg swelling.  Gastrointestinal: Negative for vomiting.  Musculoskeletal: Negative for back pain.  Skin: Negative for rash.  Neurological: Negative for loss of consciousness and headaches.  Psychiatric/Behavioral: Positive for depression. The patient is nervous/anxious.        Objective:    Physical Exam  Constitutional: She is oriented to person, place, and time. She appears well-developed and well-nourished. No distress.  HENT:  Head: Normocephalic and atraumatic.  Eyes: Conjunctivae are normal.  Neck: Normal range of motion. No thyromegaly present.  Cardiovascular: Normal rate and regular rhythm.   Pulmonary/Chest: Effort normal and breath sounds normal. She has no wheezes.  Abdominal: Soft. Bowel sounds are normal. There is no tenderness.  Musculoskeletal: Normal range of motion. She exhibits no edema or deformity.  Neurological: She is alert and oriented to person, place, and time.  Skin: Skin is warm and dry. She is not diaphoretic.  Psychiatric: She has a normal mood and affect.    BP 118/62 (BP Location: Left Arm, Patient Position: Sitting, Cuff Size: Normal)   Pulse 71   Temp 98.4 F (36.9 C) (Oral)   Resp 18   Ht 4' 11.5" (1.511 m)   Wt 131 lb 9.6 oz (59.7 kg)   SpO2 98%   BMI 26.14 kg/m  Wt Readings from Last 3 Encounters:  07/06/16 131 lb 9.6 oz (59.7 kg)  06/17/15 128 lb (58.1 kg)  01/28/15 125 lb (56.7 kg)   BP Readings from Last 3 Encounters:  07/06/16 118/62  06/17/15 112/72  01/28/15 119/78     Immunization History  Administered Date(s) Administered  . Influenza Split 12/21/2011  . Influenza,inj,Quad PF,36+ Mos 12/12/2013  . Tdap 03/23/2011    Health Maintenance  Topic Date Due  . PAP SMEAR  10/21/2014  . INFLUENZA VACCINE  10/20/2016  . TETANUS/TDAP  03/22/2021  . HIV Screening  Completed    Lab Results  Component Value Date   WBC 10.4 06/17/2015   HGB 13.1 06/17/2015   HCT  38.7 06/17/2015   PLT 327.0 06/17/2015   GLUCOSE 76 06/17/2015   CHOL 209 (H) 06/17/2015   TRIG 92.0 06/17/2015   HDL 61.10 06/17/2015   LDLDIRECT 152.7 02/03/2012   LDLCALC 129 (H) 06/17/2015   ALT 11 06/17/2015   AST 17 06/17/2015   NA 138 06/17/2015   K 3.8 06/17/2015   CL 104 06/17/2015   CREATININE 0.66 06/17/2015   BUN 13 06/17/2015   CO2 27 06/17/2015   TSH 1.49 06/17/2015    Lab Results  Component Value Date   TSH 1.49 06/17/2015   Lab Results  Component Value Date   WBC 10.4 06/17/2015   HGB 13.1 06/17/2015   HCT 38.7 06/17/2015   MCV 88.0 06/17/2015   PLT 327.0 06/17/2015   Lab Results  Component  Value Date   NA 138 06/17/2015   K 3.8 06/17/2015   CO2 27 06/17/2015   GLUCOSE 76 06/17/2015   BUN 13 06/17/2015   CREATININE 0.66 06/17/2015   BILITOT 0.7 06/17/2015   ALKPHOS 36 (L) 06/17/2015   AST 17 06/17/2015   ALT 11 06/17/2015   PROT 7.2 06/17/2015   ALBUMIN 4.3 06/17/2015   CALCIUM 10.3 06/17/2015   GFR 108.28 06/17/2015   Lab Results  Component Value Date   CHOL 209 (H) 06/17/2015   Lab Results  Component Value Date   HDL 61.10 06/17/2015   Lab Results  Component Value Date   LDLCALC 129 (H) 06/17/2015   Lab Results  Component Value Date   TRIG 92.0 06/17/2015   Lab Results  Component Value Date   CHOLHDL 3 06/17/2015   No results found for: HGBA1C       Assessment & Plan:   Problem List Items Addressed This Visit    Hyperlipidemia - Primary    Encouraged heart healthy diet, increase exercise, avoid trans fats, consider a krill oil cap daily      Relevant Orders   Lipid panel   Preventative health care    Patient encouraged to maintain heart healthy diet, regular exercise, adequate sleep. Consider daily probiotics. Take medications as prescribed. Labs today      Hx of thyroid disease    Check TSH today      Relevant Orders   TSH   Anxiety    Strong FH of hi anxiety with a mom with GAD, patient is struggling with  increasing anxiety due to her 33 year old son's ADHD abd ODD. She is setting up counseling for her son with [facility], [facility] Passey.soon. She is struggling her self with agitation, anxiety, racing thoughts. She is trying to figure out what she has to do next. Start Escitalopram 5 mg po qd then increase to 10 mg daily and can use rare alprazolam 0.25 mg tab prn for anxiety disorder. Spent greater than 35 minutes of a 45 minute appointment in counseling with patient today to try and develop a plan for her. She is given phone number of [facility] and asked to consider personal and couples counseling      Relevant Medications   escitalopram (LEXAPRO) 10 MG tablet   ALPRAZolam (XANAX) 0.25 MG tablet    Other Visit Diagnoses    Visit for preventive health examination       Relevant Orders   CBC   Comprehensive metabolic panel      I am having Ms. Kindel start on escitalopram and ALPRAZolam. I am also having her maintain her LARIN 24 FE.  Meds ordered this encounter  Medications  . escitalopram (LEXAPRO) 10 MG tablet    Sig: Take 1/2 tablet by mouth for 1 week then take 1 tablet by mouth daily.    Dispense:  30 tablet    Refill:  2  . ALPRAZolam (XANAX) 0.25 MG tablet    Sig: Take 1/2-1 tablet by mouth as needed for anxiety    Dispense:  30 tablet    Refill:  1    CMA served as scribe during this visit. History, Physical and Plan performed by medical provider. Documentation and orders reviewed and attested to.  Danise Edge, MD

## 2016-07-20 MED FILL — ESCITALOPRAM 10 MG TABLET: 10 | 30 days supply | Qty: 30 | Fill #0

## 2016-07-20 MED FILL — ALPRAZolam 0.25 MG TABS: 0.25 | 30 days supply | Qty: 30 | Fill #0

## 2016-08-24 MED FILL — ESCITALOPRAM 10 MG TABLET: 10 | 30 days supply | Qty: 30 | Fill #1

## 2016-08-30 MED FILL — LARIN 24 FE 1 MG-20 MCG TAB: 1-20 | 84 days supply | Qty: 84 | Fill #2

## 2016-09-10 ENCOUNTER — Encounter: Payer: Self-pay | Admitting: Family Medicine

## 2016-09-14 ENCOUNTER — Ambulatory Visit: Payer: 59 | Admitting: Family Medicine

## 2016-09-23 MED FILL — ESCITALOPRAM 10 MG TABLET: 10 | 30 days supply | Qty: 30 | Fill #2

## 2016-10-25 ENCOUNTER — Other Ambulatory Visit: Payer: Self-pay | Admitting: Family Medicine

## 2016-10-25 MED FILL — ESCITALOPRAM 10 MG TABLET: 10 | 30 days supply | Qty: 30 | Fill #0

## 2016-11-23 MED FILL — ESCITALOPRAM 10 MG TABLET: 10 | 30 days supply | Qty: 30 | Fill #1

## 2016-11-23 MED FILL — LARIN 24 FE 1 MG-20 MCG TAB: 1-20 | 84 days supply | Qty: 84 | Fill #3

## 2016-12-30 MED FILL — ESCITALOPRAM 10 MG TABLET: 10 | 30 days supply | Qty: 30 | Fill #2

## 2017-02-07 ENCOUNTER — Other Ambulatory Visit: Payer: Self-pay | Admitting: Family Medicine

## 2017-02-07 MED FILL — BLISOVI 24 FE TABLET: 1-20 | 84 days supply | Qty: 84 | Fill #0

## 2017-02-07 MED FILL — ESCITALOPRAM 10 MG TABLET: 10 | 30 days supply | Qty: 30 | Fill #0

## 2017-02-16 DIAGNOSIS — Z01419 Encounter for gynecological examination (general) (routine) without abnormal findings: Secondary | ICD-10-CM | POA: Diagnosis not present

## 2017-02-17 DIAGNOSIS — Z6828 Body mass index (BMI) 28.0-28.9, adult: Secondary | ICD-10-CM | POA: Diagnosis not present

## 2017-02-17 DIAGNOSIS — Z01419 Encounter for gynecological examination (general) (routine) without abnormal findings: Secondary | ICD-10-CM | POA: Diagnosis not present

## 2017-03-04 ENCOUNTER — Encounter: Payer: Self-pay | Admitting: Family Medicine

## 2017-03-04 ENCOUNTER — Telehealth: Payer: 59 | Admitting: Family

## 2017-03-04 DIAGNOSIS — R21 Rash and other nonspecific skin eruption: Secondary | ICD-10-CM | POA: Diagnosis not present

## 2017-03-04 MED ORDER — PREDNISONE 10 MG PO TABS: 10.0000 mg | ORAL_TABLET | ORAL | 0 refills | Status: DC

## 2017-03-04 NOTE — Progress Notes (Signed)
Thank you for the details you included in the comment boxes. Those details are very helpful in determining the best course of treatment for you and help us to provide the best care. I agree that it is not likely those things, or the birth control. However, if your throat gets tight or you notice swelling in your mouth/tongue/etc, please call 911.   E Visit for Rash  We are sorry that you are not feeling well. Here is how we plan to help!  Based on what you shared with me you may have a virus or an allergic reaction.  Avoid contact with pregnant women until a diagnosis is made.  Most viral rashes are contagious (especially if a fever is present).  You can return to work or school after the rash is gone or when your doctor says it is safe to return with the rash.    Sterapred 10 mg dose pak     HOME CARE:   Take cool showers and avoid direct sunlight.  Apply cool compress or wet dressings.  Take a bath in an oatmeal bath.  Sprinkle content of one Aveeno packet under running faucet with comfortably warm water.  Bathe for 15-20 minutes, 1-2 times daily.  Pat dry with a towel. Do not rub the rash.  Use hydrocortisone cream.  Take an antihistamine like Benadryl for widespread rashes that itch.  The adult dose of Benadryl is 25-50 mg by mouth 4 times daily.  Caution:  This type of medication may cause sleepiness.  Do not drink alcohol, drive, or operate dangerous machinery while taking antihistamines.  Do not take these medications if you have prostate enlargement.  Read package instructions thoroughly on all medications that you take.  GET HELP RIGHT AWAY IF:   Symptoms don't go away after treatment.  Severe itching that persists.  If you rash spreads or swells.  If you rash begins to smell.  If it blisters and opens or develops a yellow-brown crust.  You develop a fever.  You have a sore throat.  You become short of breath.  MAKE SURE YOU:  Understand these  instructions. Will watch your condition. Will get help right away if you are not doing well or get worse.  Thank you for choosing an e-visit. Your e-visit answers were reviewed by a board certified advanced clinical practitioner to complete your personal care plan. Depending upon the condition, your plan could have included both over the counter or prescription medications. Please review your pharmacy choice. Be sure that the pharmacy you have chosen is open so that you can pick up your prescription now.  If there is a problem you may message your provider in MyChart to have the prescription routed to another pharmacy. Your safety is important to us. If you have drug allergies check your prescription carefully.  For the next 24 hours, you can use MyChart to ask questions about today's visit, request a non-urgent call back, or ask for a work or school excuse from your e-visit provider. You will get an email in the next two days asking about your experience. I hope that your e-visit has been valuable and will speed your recovery.

## 2017-03-29 ENCOUNTER — Ambulatory Visit (INDEPENDENT_AMBULATORY_CARE_PROVIDER_SITE_OTHER): Payer: 59 | Admitting: Family Medicine

## 2017-03-29 ENCOUNTER — Encounter: Payer: Self-pay | Admitting: Family Medicine

## 2017-03-29 VITALS — BP 118/70 | HR 64 | Temp 98.2°F | Resp 18 | Wt 144.2 lb

## 2017-03-29 DIAGNOSIS — F419 Anxiety disorder, unspecified: Secondary | ICD-10-CM

## 2017-03-29 DIAGNOSIS — R21 Rash and other nonspecific skin eruption: Secondary | ICD-10-CM

## 2017-03-29 MED ORDER — ESCITALOPRAM OXALATE 10 MG PO TABS: 10.0000 mg | ORAL_TABLET | Freq: Two times a day (BID) | ORAL | 2 refills | Status: DC

## 2017-03-29 MED ORDER — ESCITALOPRAM OXALATE 10 MG PO TABS: 10.0000 mg | ORAL_TABLET | Freq: Every day | ORAL | 2 refills | Status: DC

## 2017-03-29 MED ORDER — TRIAMCINOLONE 0.1 % CREAM:EUCERIN CREAM 1:1: 1.0000 "application " | TOPICAL_CREAM | Freq: Two times a day (BID) | CUTANEOUS | 1 refills | Status: DC | PRN

## 2017-03-29 MED FILL — ESCITALOPRAM 10 MG TABLET: 10 | 30 days supply | Qty: 60 | Fill #0

## 2017-03-29 NOTE — Progress Notes (Signed)
Subjective:  I acted as a Neurosurgeonscribe for Dr. Abner GreenspanBlyth. Stacy, [place]  Patient ID: Stacy Dennis, female    DOB: 01/13/1981, 37 y.o.   MRN: [id number]  No chief complaint on file.   HPI  Patient is in today for an acute visit for a rash. She has had similar but less intense rash in the past.  She did start Zyrtec and Zantac twice daily as recommended and does note  rash did improve somewhat.  At this point it is localized to her low back primarily after having been much more diffuse.  She did do an E visit and got some prednisone which also helped somewhat.  She denies any new recent products or change in diet.  She also denies any major illness but does acknowledge a significant amount of recent stress. Denies CP/palp/SOB/HA/congestion/fevers/GI or GU c/o. Taking meds as prescribed  Patient Care Team: Bradd CanaryBlyth, Shekina Cordell A, MD as PCP - General (Family Medicine) Zelphia CairoAdkins, Gretchen, MD as Consulting Physician (Obstetrics and Gynecology)   Past Medical History:  Diagnosis Date  . Abnormal menses 06/17/2015  . Allergic state 01/28/2012  . Anxiety 07/06/2016  . Dermatitis 06/17/2015  . Endometriosis   . H/O varicella   . Hypercholesteremia    no meds  . Hyperlipidemia 01/28/2012  . Pregnancy related carpal tunnel syndrome, antepartum 2013  . Preventative health care 01/28/2012  . Verrucous skin lesion 01/28/2012    Past Surgical History:  Procedure Laterality Date  . CESAREAN SECTION  10-10-11  . CESAREAN SECTION N/A 12/10/2013   Procedure: CESAREAN SECTION;  Surgeon: Zelphia CairoGretchen Adkins, MD;  Location: [facility];  Service: Obstetrics;  Laterality: N/A;  Repeat edc 9/27  . EYE SURGERY  2012   lasik  . MYRINGOTOMY      Family History  Problem Relation Age of Onset  . Anxiety disorder Mother   . Hyperlipidemia Mother   . Hypertension Mother   . Irritable bowel syndrome Mother        ?  . Kidney disease Father        stones  . Hyperlipidemia Father   . Cancer Paternal Grandfather        lung-  smoker  . Stroke Paternal Grandfather   . Aneurysm Paternal Grandfather   . Hyperlipidemia Paternal Grandmother   . Hypertension Maternal Grandmother   . Heart disease Maternal Grandfather   . Thyroid disease Maternal Aunt   . Crohn's disease Paternal Aunt     Social History   Socioeconomic History  . Marital status: Married    Spouse name: Not on file  . Number of children: Not on file  . Years of education: Not on file  . Highest education level: Not on file  Social Needs  . Financial resource strain: Not on file  . Food insecurity - worry: Not on file  . Food insecurity - inability: Not on file  . Transportation needs - medical: Not on file  . Transportation needs - non-medical: Not on file  Occupational History  . Not on file  Tobacco Use  . Smoking status: Never Smoker  . Smokeless tobacco: Never Used  Substance and Sexual Activity  . Alcohol use: Yes    Comment: socially when not pregnant  . Drug use: No  . Sexual activity: Yes    Partners: Male    Birth control/protection: None  Other Topics Concern  . Not on file  Social History Narrative  . Not on file    Outpatient Medications Prior  to Visit  Medication Sig Dispense Refill  . ALPRAZolam (XANAX) 0.25 MG tablet Take 1/2-1 tablet by mouth as needed for anxiety 30 tablet 1  . LARIN 24 FE 1-20 MG-MCG(24) tablet Take 1 tablet by mouth daily.   4  . escitalopram (LEXAPRO) 10 MG tablet TAKE 1/2 TABLET BY MOUTH FOR 1 WEEK, THEN 1 TABLET DAILY 30 tablet 2  . predniSONE (DELTASONE) 10 MG tablet Take 1 tablet (10 mg total) by mouth as directed. sterapred generic 21 tablet 0   No facility-administered medications prior to visit.     Allergies  Allergen Reactions  . Amoxicillin Nausea And Vomiting  . Lactose Intolerance (Gi)     Review of Systems  Constitutional: Negative for fever and malaise/fatigue.  HENT: Negative for congestion.   Eyes: Negative for blurred vision.  Respiratory: Negative for cough and  shortness of breath.   Cardiovascular: Negative for chest pain, palpitations and leg swelling.  Gastrointestinal: Negative for vomiting.  Musculoskeletal: Negative for back pain.  Skin: Positive for itching and rash.  Neurological: Negative for loss of consciousness and headaches.  Psychiatric/Behavioral: The patient is nervous/anxious.        Objective:    Physical Exam  Constitutional: She is oriented to person, place, and time. She appears well-developed and well-nourished. No distress.  HENT:  Head: Normocephalic and atraumatic.  Eyes: Conjunctivae are normal.  Neck: Normal range of motion. No thyromegaly present.  Cardiovascular: Normal rate and regular rhythm.  Pulmonary/Chest: Effort normal and breath sounds normal. She has no wheezes.  Abdominal: Soft. Bowel sounds are normal. There is no tenderness.  Musculoskeletal: Normal range of motion. She exhibits no edema or deformity.  Neurological: She is alert and oriented to person, place, and time.  Skin: Skin is warm and dry. Rash noted. She is not diaphoretic. There is erythema.  Raised maculopapular rash over low back.   Psychiatric: She has a normal mood and affect.    BP 118/70 (BP Location: Left Arm, Patient Position: Sitting, Cuff Size: Normal)   Pulse 64   Temp 98.2 F (36.8 C) (Oral)   Resp 18   Wt 144 lb 3.2 oz (65.4 kg)   SpO2 98%   BMI 28.64 kg/m  Wt Readings from Last 3 Encounters:  03/29/17 144 lb 3.2 oz (65.4 kg)  07/06/16 131 lb 9.6 oz (59.7 kg)  06/17/15 128 lb (58.1 kg)   BP Readings from Last 3 Encounters:  03/29/17 118/70  07/06/16 118/62  06/17/15 112/72     Immunization History  Administered Date(s) Administered  . Influenza Split 12/21/2011  . Influenza,inj,Quad PF,6+ Mos 12/12/2013  . Tdap 03/23/2011    Health Maintenance  Topic Date Due  . PAP SMEAR  10/21/2014  . INFLUENZA VACCINE  10/20/2016  . TETANUS/TDAP  03/22/2021  . HIV Screening  Completed    Lab Results  Component  Value Date   WBC 8.6 07/06/2016   HGB 13.3 07/06/2016   HCT 39.5 07/06/2016   PLT 317.0 07/06/2016   GLUCOSE 99 07/06/2016   CHOL 214 (H) 07/06/2016   TRIG 162.0 (H) 07/06/2016   HDL 60.40 07/06/2016   LDLDIRECT 152.7 02/03/2012   LDLCALC 122 (H) 07/06/2016   ALT 15 07/06/2016   AST 18 07/06/2016   NA 137 07/06/2016   K 3.9 07/06/2016   CL 102 07/06/2016   CREATININE 0.74 07/06/2016   BUN 14 07/06/2016   CO2 29 07/06/2016   TSH 1.21 07/06/2016    Lab Results  Component Value  Date   TSH 1.21 07/06/2016   Lab Results  Component Value Date   WBC 8.6 07/06/2016   HGB 13.3 07/06/2016   HCT 39.5 07/06/2016   MCV 88.4 07/06/2016   PLT 317.0 07/06/2016   Lab Results  Component Value Date   NA 137 07/06/2016   K 3.9 07/06/2016   CO2 29 07/06/2016   GLUCOSE 99 07/06/2016   BUN 14 07/06/2016   CREATININE 0.74 07/06/2016   BILITOT 0.4 07/06/2016   ALKPHOS 34 (L) 07/06/2016   AST 18 07/06/2016   ALT 15 07/06/2016   PROT 7.0 07/06/2016   ALBUMIN 4.5 07/06/2016   CALCIUM 9.6 07/06/2016   GFR 94.32 07/06/2016   Lab Results  Component Value Date   CHOL 214 (H) 07/06/2016   Lab Results  Component Value Date   HDL 60.40 07/06/2016   Lab Results  Component Value Date   LDLCALC 122 (H) 07/06/2016   Lab Results  Component Value Date   TRIG 162.0 (H) 07/06/2016   Lab Results  Component Value Date   CHOLHDL 4 07/06/2016   No results found for: HGBA1C       Assessment & Plan:   Problem List Items Addressed This Visit    Rash - Primary    She has had similar but less intense rash in the past.  She did start Zyrtec and Zantac twice daily as recommended and does note  rash did improve somewhat.  At this point it is localized to her low back primarily after having been much more diffuse.  She did do an E visit and got some prednisone which also helped somewhat.  She denies any new recent products or change in diet.  She also denies any major illness but does  acknowledge a significant amount of recent stress.  Continue Zyrtec and Zantac twice daily given triamcinolone/Eucerin cream to use in small amounts twice daily and referred to allergy for further testing if rash persists.      Relevant Orders   Ambulatory referral to Allergy   Anxiety    Has been under a great deal of stress in the last few months and had some family interactions over the holidays she does not know if this triggered the rash but is willing to consider the possibility.  Lexapro was increased to 10 mg twice daily we will reassess at next visit.  May use alprazolam sparingly as needed.      Relevant Medications   escitalopram (LEXAPRO) 10 MG tablet      I have discontinued Karon M. Dennis's escitalopram and predniSONE. I have also changed her escitalopram. Additionally, I am having her start on triamcinolone 0.1 % cream : eucerin. Lastly, I am having her maintain her LARIN 24 FE and ALPRAZolam.  Meds ordered this encounter  Medications  . DISCONTD: escitalopram (LEXAPRO) 10 MG tablet    Sig: Take 1 tablet (10 mg total) by mouth daily.    Dispense:  30 tablet    Refill:  2  . Triamcinolone Acetonide (TRIAMCINOLONE 0.1 % CREAM : EUCERIN) CREA    Sig: Apply 1 application topically 2 (two) times daily as needed. One to one ratio    Dispense:  1 each    Refill:  1  . escitalopram (LEXAPRO) 10 MG tablet    Sig: Take 1 tablet (10 mg total) by mouth 2 (two) times daily.    Dispense:  60 tablet    Refill:  2    CMA served as Neurosurgeon  during this visit. History, Physical and Plan performed by medical provider. Documentation and orders reviewed and attested to.  Penni Homans, MD

## 2017-03-29 NOTE — Patient Instructions (Signed)
Hives  Hives (urticaria) are itchy, red, swollen areas on your skin. Hives can appear on any part of your body and can vary in size. They can be as small as the tip of a pen or much larger. Hives often fade within 24 hours (acute hives). In other cases, new hives appear after old ones fade. This cycle can continue for several days or weeks (chronic hives).  Hives result from your body's reaction to an irritant or to something that you are allergic to (trigger). When you are exposed to a trigger, your body releases a chemical (histamine) that causes redness, itching, and swelling. You can get hives immediately after being exposed to a trigger or hours later.  Hives do not spread from person to person (are not contagious). Your hives may get worse with scratching, exercise, and emotional stress.  What are the causes?  Causes of this condition include:   Allergies to certain foods or ingredients.   Insect bites or stings.   Exposure to pollen or pet dander.   Contact with latex or chemicals.   Spending time in sunlight, heat, or cold (exposure).   Exercise.   Stress.    You can also get hives from some medical conditions and treatments. These include:   Viruses, including the common cold.   Bacterial infections, such as urinary tract infections and strep throat.   Disorders such as vasculitis, lupus, or thyroid disease.   Certain medications.   Allergy shots.   Blood transfusions.    Sometimes, the cause of hives is not known (idiopathic hives).  What increases the risk?  This condition is more likely to develop in:   Women.   People who have food allergies, especially to citrus fruits, milk, eggs, peanuts, tree nuts, or shellfish.   People who are allergic to:  ? Medicines.  ? Latex.  ? Insects.  ? Animals.  ? Pollen.   People who have certain medical conditions, includinglupus or thyroid disease.    What are the signs or symptoms?  The main symptom of this condition is raised, itchyred or white  bumps or patches on your skin. These areas may:   Become large and swollen (welts).   Change in shape and location, quickly and repeatedly.   Be separate hives or connect over a large area of skin.   Sting or become painful.   Turn white when pressed in the center (blanch).    In severe cases, yourhands, feet, and face may also become swollen. This may occur if hives develop deeper in your skin.  How is this diagnosed?  This condition is diagnosed based on your symptoms, medical history, and physical exam. Your skin, urine, or blood may be tested to find out what is causing your hives and to rule out other health issues. Your health care provider may also remove a small sample of skin from the affected area and examine it under a microscope (biopsy).  How is this treated?  Treatment depends on the severity of your condition. Your health care provider may recommend using cool, wet cloths (cool compresses) or taking cool showers to relieve itching. Hives are sometimes treated with medicines, including:   Antihistamines.   Corticosteroids.   Antibiotics.   An injectable medicine (omalizumab). Your health care provider may prescribe this if you have chronic idiopathic hives and you continue to have symptoms even after treatment with antihistamines.    Severe cases may require an emergency injection of adrenaline (epinephrine) to prevent a   life-threatening allergic reaction (anaphylaxis).  Follow these instructions at home:  Medicines   Take or apply over-the-counter and prescription medicines only as told by your health care provider.   If you were prescribed an antibiotic medicine, use it as told by your health care provider. Do not stop taking the antibiotic even if you start to feel better.  Skin Care   Apply cool compresses to the affected areas.   Do not scratch or rub your skin.  General instructions   Do not take hot showers or baths. This can make itching worse.   Do not wear tight-fitting  clothing.   Use sunscreen and wear protective clothing when you are outside.   Avoid any substances that cause your hives. Keep a journal to help you track what causes your hives. Write down:  ? What medicines you take.  ? What you eat and drink.  ? What products you use on your skin.   Keep all follow-up visits as told by your health care provider. This is important.  Contact a health care provider if:   Your symptoms are not controlled with medicine.   Your joints are painful or swollen.  Get help right away if:   You have a fever.   You have pain in your abdomen.   Your tongue or lips are swollen.   Your eyelids are swollen.   Your chest or throat feels tight.   You have trouble breathing or swallowing.  These symptoms may represent a serious problem that is an emergency. Do not wait to see if the symptoms will go away. Get medical help right away. Call your local emergency services (911 in the [place] Do not drive yourself to the hospital.  This information is not intended to replace advice given to you by your health care provider. Make sure you discuss any questions you have with your health care provider.  Document Released: 03/08/2005 Document Revised: 08/06/2015 Document Reviewed: 12/25/2014  [employer]vier Interactive Patient Education  2018 [employer]

## 2017-04-03 NOTE — Assessment & Plan Note (Signed)
She has had similar but less intense rash in the past.  She did start Zyrtec and Zantac twice daily as recommended and does note  rash did improve somewhat.  At this point it is localized to her low back primarily after having been much more diffuse.  She did do an E visit and got some prednisone which also helped somewhat.  She denies any new recent products or change in diet.  She also denies any major illness but does acknowledge a significant amount of recent stress.  Continue Zyrtec and Zantac twice daily given triamcinolone/Eucerin cream to use in small amounts twice daily and referred to allergy for further testing if rash persists.

## 2017-04-03 NOTE — Assessment & Plan Note (Signed)
Has been under a great deal of stress in the last few months and had some family interactions over the holidays she does not know if this triggered the rash but is willing to consider the possibility.  Lexapro was increased to 10 mg twice daily we will reassess at next visit.  May use alprazolam sparingly as needed.

## 2017-04-25 MED FILL — BLISOVI 24 FE TABLET: 1-20 | 84 days supply | Qty: 84 | Fill #0

## 2017-04-27 ENCOUNTER — Encounter: Payer: Self-pay | Admitting: Family Medicine

## 2017-04-27 DIAGNOSIS — R635 Abnormal weight gain: Secondary | ICD-10-CM

## 2017-04-30 ENCOUNTER — Encounter: Payer: Self-pay | Admitting: Family Medicine

## 2017-05-05 ENCOUNTER — Encounter: Payer: Self-pay | Admitting: Family Medicine

## 2017-05-05 ENCOUNTER — Other Ambulatory Visit (INDEPENDENT_AMBULATORY_CARE_PROVIDER_SITE_OTHER): Payer: 59

## 2017-05-05 DIAGNOSIS — R635 Abnormal weight gain: Secondary | ICD-10-CM | POA: Diagnosis not present

## 2017-05-05 LAB — TSH: TSH: 1.77 u[IU]/mL (ref 0.35–4.50)

## 2017-05-19 ENCOUNTER — Ambulatory Visit: Payer: Self-pay | Admitting: Allergy & Immunology

## 2017-07-05 MED FILL — ESCITALOPRAM 10 MG TABLET: 10 | 30 days supply | Qty: 60 | Fill #1

## 2017-07-14 ENCOUNTER — Encounter: Payer: 59 | Admitting: Family Medicine

## 2017-07-21 ENCOUNTER — Encounter: Payer: Self-pay | Admitting: Family Medicine

## 2017-07-21 ENCOUNTER — Ambulatory Visit (INDEPENDENT_AMBULATORY_CARE_PROVIDER_SITE_OTHER): Payer: 59 | Admitting: Family Medicine

## 2017-07-21 VITALS — BP 108/68 | HR 68 | Temp 98.2°F | Resp 18 | Ht 60.0 in | Wt 149.6 lb

## 2017-07-21 DIAGNOSIS — E785 Hyperlipidemia, unspecified: Secondary | ICD-10-CM | POA: Diagnosis not present

## 2017-07-21 DIAGNOSIS — Z0000 Encounter for general adult medical examination without abnormal findings: Secondary | ICD-10-CM | POA: Diagnosis not present

## 2017-07-21 DIAGNOSIS — F419 Anxiety disorder, unspecified: Secondary | ICD-10-CM | POA: Diagnosis not present

## 2017-07-21 LAB — COMPREHENSIVE METABOLIC PANEL
ALK PHOS: 49 U/L (ref 39–117)
ALT: 20 U/L (ref 0–35)
AST: 23 U/L (ref 0–37)
Albumin: 4.1 g/dL (ref 3.5–5.2)
BUN: 15 mg/dL (ref 6–23)
CHLORIDE: 102 meq/L (ref 96–112)
CO2: 28 mEq/L (ref 19–32)
Calcium: 9.2 mg/dL (ref 8.4–10.5)
Creatinine, Ser: 0.7 mg/dL (ref 0.40–1.20)
GFR: 99.99 mL/min (ref >60.00)
Glucose, Bld: 91 mg/dL (ref 70–99)
Potassium: 3.8 mEq/L (ref 3.5–5.1)
SODIUM: 137 meq/L (ref 135–145)
TOTAL PROTEIN: 6.7 g/dL (ref 6.0–8.3)
Total Bilirubin: 0.5 mg/dL (ref 0.2–1.2)

## 2017-07-21 LAB — CBC
HCT: 38.1 % (ref 36.0–46.0)
HEMOGLOBIN: 13.2 g/dL (ref 12.0–15.0)
MCHC: 34.6 g/dL (ref 30.0–36.0)
MCV: 87.4 fl (ref 78.0–100.0)
Platelets: 377 10*3/uL (ref 150.0–400.0)
RBC: 4.36 Mil/uL (ref 3.87–5.11)
RDW: 12.2 % (ref 11.5–15.5)
WBC: 8.9 10*3/uL (ref 4.0–10.5)

## 2017-07-21 LAB — LIPID PANEL
Cholesterol: 239 mg/dL — ABNORMAL HIGH (ref 0–200)
HDL: 65.7 mg/dL (ref >39.00)
LDL Cholesterol: 154 mg/dL — ABNORMAL HIGH (ref 0–99)
NONHDL: 172.92
Total CHOL/HDL Ratio: 4
Triglycerides: 96 mg/dL (ref 0.0–149.0)
VLDL: 19.2 mg/dL (ref 0.0–40.0)

## 2017-07-21 LAB — TSH: TSH: 1.24 u[IU]/mL (ref 0.35–4.50)

## 2017-07-21 MED ORDER — ESCITALOPRAM OXALATE 10 MG PO TABS: 10.0000 mg | ORAL_TABLET | Freq: Two times a day (BID) | ORAL | 2 refills | Status: DC

## 2017-07-21 NOTE — Assessment & Plan Note (Signed)
Encouraged heart healthy diet, increase exercise, avoid trans fats, consider a krill oil cap daily 

## 2017-07-21 NOTE — Progress Notes (Signed)
Subjective:  I acted as a Neurosurgeon for Dr. Abner Greenspan. [place], [place]  Patient ID: Stacy Dennis, female    DOB: 1981-02-19, 37 y.o.   MRN: [id number]  No chief complaint on file.   HPI  Patient is in today for an annual exam and follow up on anxiety and she reports she is doing much better, no recent febrile illness or hospitalizations. Anxiety is better with medication changes. Less irritable with her children, more hopeful. No suiicidal ideation. Doing well with activities of daily living. Is staying active and eating a heart healthy diet. No recent hospitalizations or febrile illness. Denies CP/palp/SOB/HA/congestion/fevers/GI or GU c/o. Taking meds as prescribed  Patient Care Team: Bradd Canary, MD as PCP - General (Family Medicine) Zelphia Cairo, MD as Consulting Physician (Obstetrics and Gynecology)   Past Medical History:  Diagnosis Date  . Abnormal menses 06/17/2015  . Allergic state 01/28/2012  . Anxiety 07/06/2016  . Dermatitis 06/17/2015  . Endometriosis   . H/O varicella   . Hypercholesteremia    no meds  . Hyperlipidemia 01/28/2012  . Pregnancy related carpal tunnel syndrome, antepartum 2013  . Preventative health care 01/28/2012  . Verrucous skin lesion 01/28/2012    Past Surgical History:  Procedure Laterality Date  . CESAREAN SECTION  10-10-11  . CESAREAN SECTION N/A 12/10/2013   Procedure: CESAREAN SECTION;  Surgeon: Zelphia Cairo, MD;  Location: [facility];  Service: Obstetrics;  Laterality: N/A;  Repeat edc 9/27  . EYE SURGERY  2012   lasik  . MYRINGOTOMY      Family History  Problem Relation Age of Onset  . Anxiety disorder Mother   . Hyperlipidemia Mother   . Hypertension Mother   . Irritable bowel syndrome Mother        ?  . Kidney disease Father        stones  . Hyperlipidemia Father   . Cancer Paternal Grandfather        lung- smoker  . Stroke Paternal Grandfather   . Aneurysm Paternal Grandfather   . Hyperlipidemia Paternal Grandmother   .  Hypertension Maternal Grandmother   . Heart disease Maternal Grandfather   . Thyroid disease Maternal Aunt   . Crohn's disease Paternal Aunt     Social History   Socioeconomic History  . Marital status: Married    Spouse name: Not on file  . Number of children: Not on file  . Years of education: Not on file  . Highest education level: Not on file  Occupational History  . Not on file  Social Needs  . Financial resource strain: Not on file  . Food insecurity:    Worry: Not on file    Inability: Not on file  . Transportation needs:    Medical: Not on file    Non-medical: Not on file  Tobacco Use  . Smoking status: Never Smoker  . Smokeless tobacco: Never Used  Substance and Sexual Activity  . Alcohol use: Yes    Comment: socially when not pregnant  . Drug use: No  . Sexual activity: Yes    Partners: Male    Birth control/protection: None  Lifestyle  . Physical activity:    Days per week: Not on file    Minutes per session: Not on file  . Stress: Not on file  Relationships  . Social connections:    Talks on phone: Not on file    Gets together: Not on file    Attends religious service: Not  on file    Active member of club or organization: Not on file    Attends meetings of clubs or organizations: Not on file    Relationship status: Not on file  . Intimate partner violence:    Fear of current or ex partner: Not on file    Emotionally abused: Not on file    Physically abused: Not on file    Forced sexual activity: Not on file  Other Topics Concern  . Not on file  Social History Narrative  . Not on file    Outpatient Medications Prior to Visit  Medication Sig Dispense Refill  . ALPRAZolam (XANAX) 0.25 MG tablet Take 1/2-1 tablet by mouth as needed for anxiety 30 tablet 1  . LARIN 24 FE 1-20 MG-MCG(24) tablet Take 1 tablet by mouth daily.   4  . escitalopram (LEXAPRO) 10 MG tablet Take 1 tablet (10 mg total) by mouth 2 (two) times daily. (Patient taking  differently: Take 10 mg by mouth daily. ) 60 tablet 2  . Triamcinolone Acetonide (TRIAMCINOLONE 0.1 % CREAM : EUCERIN) CREA Apply 1 application topically 2 (two) times daily as needed. One to one ratio 1 each 1   No facility-administered medications prior to visit.     Allergies  Allergen Reactions  . Amoxicillin Nausea And Vomiting  . Lactose Intolerance (Gi)     Review of Systems  Constitutional: Negative for fever and malaise/fatigue.  HENT: Negative for congestion.   Eyes: Negative for blurred vision.  Respiratory: Negative for shortness of breath.   Cardiovascular: Negative for chest pain, palpitations and leg swelling.  Gastrointestinal: Negative for abdominal pain, blood in stool and nausea.  Genitourinary: Negative for dysuria and frequency.  Musculoskeletal: Negative for falls.  Skin: Negative for rash.  Neurological: Negative for dizziness, loss of consciousness and headaches.  Endo/Heme/Allergies: Negative for environmental allergies.  Psychiatric/Behavioral: Negative for depression. The patient is not nervous/anxious.        Objective:    Physical Exam  Constitutional: She is oriented to person, place, and time. No distress.  HENT:  Head: Normocephalic and atraumatic.  Right Ear: External ear normal.  Left Ear: External ear normal.  Nose: Nose normal.  Mouth/Throat: Oropharynx is clear and moist. No oropharyngeal exudate.  Eyes: Pupils are equal, round, and reactive to light. Conjunctivae are normal. Right eye exhibits no discharge. Left eye exhibits no discharge. No scleral icterus.  Neck: Normal range of motion. Neck supple. No thyromegaly present.  Cardiovascular: Normal rate, regular rhythm, normal heart sounds and intact distal pulses.  No murmur heard. Pulmonary/Chest: Effort normal and breath sounds normal. No respiratory distress. She has no wheezes. She has no rales.  Abdominal: Soft. Bowel sounds are normal. She exhibits no distension and no mass.  There is no tenderness.  Musculoskeletal: Normal range of motion. She exhibits no edema or tenderness.  Lymphadenopathy:    She has no cervical adenopathy.  Neurological: She is alert and oriented to person, place, and time. She has normal reflexes. She displays normal reflexes. No cranial nerve deficit. Coordination normal.  Skin: Skin is warm and dry. No rash noted. She is not diaphoretic.    BP 108/68 (BP Location: Left Arm, Patient Position: Sitting, Cuff Size: Normal)   Pulse 68   Temp 98.2 F (36.8 C) (Oral)   Resp 18   Ht 5' (1.524 m)   Wt 149 lb 9.6 oz (67.9 kg)   SpO2 98%   BMI 29.22 kg/m  Wt Readings from Last  3 Encounters:  07/21/17 149 lb 9.6 oz (67.9 kg)  03/29/17 144 lb 3.2 oz (65.4 kg)  07/06/16 131 lb 9.6 oz (59.7 kg)   BP Readings from Last 3 Encounters:  07/21/17 108/68  03/29/17 118/70  07/06/16 118/62     Immunization History  Administered Date(s) Administered  . Influenza Split 12/21/2011  . Influenza,inj,Quad PF,6+ Mos 12/12/2013  . Tdap 03/23/2011    Health Maintenance  Topic Date Due  . PAP SMEAR  10/21/2014  . INFLUENZA VACCINE  10/20/2017  . TETANUS/TDAP  03/22/2021  . HIV Screening  Completed    Lab Results  Component Value Date   WBC 8.9 07/21/2017   HGB 13.2 07/21/2017   HCT 38.1 07/21/2017   PLT 377.0 07/21/2017   GLUCOSE 91 07/21/2017   CHOL 239 (H) 07/21/2017   TRIG 96.0 07/21/2017   HDL 65.70 07/21/2017   LDLDIRECT 152.7 02/03/2012   LDLCALC 154 (H) 07/21/2017   ALT 20 07/21/2017   AST 23 07/21/2017   NA 137 07/21/2017   K 3.8 07/21/2017   CL 102 07/21/2017   CREATININE 0.70 07/21/2017   BUN 15 07/21/2017   CO2 28 07/21/2017   TSH 1.24 07/21/2017    Lab Results  Component Value Date   TSH 1.24 07/21/2017   Lab Results  Component Value Date   WBC 8.9 07/21/2017   HGB 13.2 07/21/2017   HCT 38.1 07/21/2017   MCV 87.4 07/21/2017   PLT 377.0 07/21/2017   Lab Results  Component Value Date   NA 137 07/21/2017    K 3.8 07/21/2017   CO2 28 07/21/2017   GLUCOSE 91 07/21/2017   BUN 15 07/21/2017   CREATININE 0.70 07/21/2017   BILITOT 0.5 07/21/2017   ALKPHOS 49 07/21/2017   AST 23 07/21/2017   ALT 20 07/21/2017   PROT 6.7 07/21/2017   ALBUMIN 4.1 07/21/2017   CALCIUM 9.2 07/21/2017   GFR 99.99 07/21/2017   Lab Results  Component Value Date   CHOL 239 (H) 07/21/2017   Lab Results  Component Value Date   HDL 65.70 07/21/2017   Lab Results  Component Value Date   LDLCALC 154 (H) 07/21/2017   Lab Results  Component Value Date   TRIG 96.0 07/21/2017   Lab Results  Component Value Date   CHOLHDL 4 07/21/2017   No results found for: HGBA1C       Assessment & Plan:   Problem List Items Addressed This Visit    Hyperlipidemia    Encouraged heart healthy diet, increase exercise, avoid trans fats, consider a krill oil cap daily      Relevant Orders   Lipid panel (Completed)   Preventative health care    Patient encouraged to maintain heart healthy diet, regular exercise, adequate sleep. Consider daily probiotics. Take medications as prescribed. Labs reviewed      Relevant Orders   CBC (Completed)   Comprehensive metabolic panel (Completed)   TSH (Completed)   Anxiety    Only taking the Lexapro once daily and she is managing life stressors of caring for 37 and a 3 year old and her parents and sister.She is discouraged about her ability to motivate herself to exercise and eat right. Her self discipline is failing her. [facility] barely getting to 6 hours of sleep. She is frustrated with her weight.       Relevant Medications   escitalopram (LEXAPRO) 10 MG tablet  I have discontinued Nelle M. Moscoso's triamcinolone 0.1 % cream : eucerin. I am also having her maintain her LARIN 24 FE, ALPRAZolam, and escitalopram.  Meds ordered this encounter  Medications  . escitalopram (LEXAPRO) 10 MG tablet    Sig: Take 1 tablet (10 mg total) by mouth 2  (two) times daily.    Dispense:  60 tablet    Refill:  2    CMA served as scribe during this visit. History, Physical and Plan performed by medical provider. Documentation and orders reviewed and attested to.  Danise Edge, MD

## 2017-07-21 NOTE — Assessment & Plan Note (Signed)
Patient encouraged to maintain heart healthy diet, regular exercise, adequate sleep. Consider daily probiotics. Take medications as prescribed. Labs reviewed 

## 2017-07-21 NOTE — Patient Instructions (Addendum)
NOOM app  Ghrelin, Leptin hormones in the brain that affect appetite  get [place] a copy Advanced Directives and Preventive Care 18-39 Years, Female Preventive care refers to lifestyle choices and visits with your health care provider that can promote health and wellness. What does preventive care include?  A yearly physical exam. This is also called an annual well check.  Dental exams once or twice a year.  Routine eye exams. Ask your health care provider how often you should have your eyes checked.  Personal lifestyle choices, including: ? Daily care of your teeth and gums. ? Regular physical activity. ? Eating a healthy diet. ? Avoiding tobacco and drug use. ? Limiting alcohol use. ? Practicing safe sex. ? Taking vitamin and mineral supplements as recommended by your health care provider. What happens during an annual well check? The services and screenings done by your health care provider during your annual well check will depend on your age, overall health, lifestyle risk factors, and family history of disease. Counseling Your health care provider may ask you questions about your:  Alcohol use.  Tobacco use.  Drug use.  Emotional well-being.  Home and relationship well-being.  Sexual activity.  Eating habits.  Work and work Statistician.  Method of birth control.  Menstrual cycle.  Pregnancy history.  Screening You may have the following tests or measurements:  Height, weight, and BMI.  Diabetes screening. This is done by checking your blood sugar (glucose) after you have not eaten for a while (fasting).  Blood pressure.  Lipid and cholesterol levels. These may be checked every 5 years starting at age 54.  Skin check.  Hepatitis C blood test.  Hepatitis B blood test.  Sexually transmitted disease (STD) testing.  BRCA-related cancer screening. This may be done if you have a family history of breast, ovarian, tubal, or peritoneal cancers.  Pelvic  exam and Pap test. This may be done every 3 years starting at age 6. Starting at age 83, this may be done every 5 years if you have a Pap test in combination with an HPV test.  Discuss your test results, treatment options, and if necessary, the need for more tests with your health care provider. Vaccines Your health care provider may recommend certain vaccines, such as:  Influenza vaccine. This is recommended every year.  Tetanus, diphtheria, and acellular pertussis (Tdap, Td) vaccine. You may need a Td booster every 10 years.  Varicella vaccine. You may need this if you have not been vaccinated.  HPV vaccine. If you are 86 or younger, you may need three doses over 6 months.  Measles, mumps, and rubella (MMR) vaccine. You may need at least one dose of MMR. You may also need a second dose.  Pneumococcal 13-valent conjugate (PCV13) vaccine. You may need this if you have certain conditions and were not previously vaccinated.  Pneumococcal polysaccharide (PPSV23) vaccine. You may need one or two doses if you smoke cigarettes or if you have certain conditions.  Meningococcal vaccine. One dose is recommended if you are age 6-21 years and a first-year college student living in a residence hall, or if you have one of several medical conditions. You may also need additional booster doses.  Hepatitis A vaccine. You may need this if you have certain conditions or if you travel or work in places where you may be exposed to hepatitis A.  Hepatitis B vaccine. You may need this if you have certain conditions or if you travel or work in places  where you may be exposed to hepatitis B.  Haemophilus influenzae type b (Hib) vaccine. You may need this if you have certain risk factors.  Talk to your health care provider about which screenings and vaccines you need and how often you need them. This information is not intended to replace advice given to you by your health care provider. Make sure you discuss  any questions you have with your health care provider. Document Released: 05/04/2001 Document Revised: 11/26/2015 Document Reviewed: 01/07/2015 Elsevier Interactive Patient Education  [employer].

## 2017-07-21 NOTE — Assessment & Plan Note (Signed)
Only taking the Lexapro once daily and she is managing life stressors of caring for 22 and a 37 year old and her parents and sister.She is discouraged about her ability to motivate herself to exercise and eat right. Her self discipline is failing her. [facility] [employer] but is barely getting to 6 hours of sleep. She is frustrated with her weight.

## 2017-08-11 ENCOUNTER — Encounter: Payer: Self-pay | Admitting: Family Medicine

## 2017-08-16 MED FILL — ESCITALOPRAM 10 MG TABLET: 10 | 30 days supply | Qty: 60 | Fill #2

## 2017-08-16 MED FILL — BLISOVI 24 FE TABLET: 1-20 | 84 days supply | Qty: 84 | Fill #1

## 2017-09-06 ENCOUNTER — Encounter: Payer: Self-pay | Admitting: Sports Medicine

## 2017-09-06 ENCOUNTER — Ambulatory Visit (INDEPENDENT_AMBULATORY_CARE_PROVIDER_SITE_OTHER): Payer: 59 | Admitting: Sports Medicine

## 2017-09-06 DIAGNOSIS — M25572 Pain in left ankle and joints of left foot: Secondary | ICD-10-CM

## 2017-09-06 DIAGNOSIS — M25579 Pain in unspecified ankle and joints of unspecified foot: Secondary | ICD-10-CM | POA: Insufficient documentation

## 2017-09-06 NOTE — Progress Notes (Signed)
Chief complaint left ankle pain  Patient runs and is active in recreational sports with her children who are ages 614 and 6 [place]. 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 Range of motion is full in all directions. Strength is 5/5 in all directions. Stable lateral and medial ligaments; squeeze test and kleiger test unremarkable; Note she does have some general ankle laxity RT and Left;  Left still has increased translation on anterior drawer. Talar dome nontender; No pain at base of 5th MT; No tenderness over cuboid; No tenderness over N spot or navicular prominence No tenderness on posterior aspects of lateral and medial malleolus No sign of peroneal tendon subluxations; Negative tarsal tunnel tinel's  Feet markedly pronated with loss of long. Arch bilat and pes planus Able to walk 4 steps.

## 2017-09-06 NOTE — Assessment & Plan Note (Signed)
I think she would benefit from using a compression ankle sleeve Resume balance exercises We made changes to her dress orthotics to give them more stability in the ankle and heel  continue using her work orthotics  Ease back into running as long as the ankle does not swell or seem unstable  Reck PRN

## 2017-09-15 MED FILL — ESCITALOPRAM 10 MG TABLET: 10 | 30 days supply | Qty: 60 | Fill #0

## 2017-10-31 MED FILL — BLISOVI 24 FE TABLET: 1-20 | 84 days supply | Qty: 84 | Fill #2

## 2018-01-23 MED FILL — BLISOVI 24 FE TABLET: 1-20 | 84 days supply | Qty: 84 | Fill #3

## 2018-01-26 ENCOUNTER — Ambulatory Visit: Payer: 59 | Admitting: Family Medicine

## 2018-03-02 DIAGNOSIS — Z01419 Encounter for gynecological examination (general) (routine) without abnormal findings: Secondary | ICD-10-CM | POA: Diagnosis not present

## 2018-03-02 DIAGNOSIS — Z6829 Body mass index (BMI) 29.0-29.9, adult: Secondary | ICD-10-CM | POA: Diagnosis not present

## 2018-04-17 MED FILL — LARIN 24 FE 1 MG-20 MCG TAB: 1-20 | 84 days supply | Qty: 84 | Fill #0

## 2018-06-13 ENCOUNTER — Encounter: Payer: Self-pay | Admitting: Family Medicine

## 2018-07-04 MED FILL — BLISOVI 24 FE 1-20 MG-MCG(2: 1-20 | 84 days supply | Qty: 84 | Fill #0

## 2018-07-24 ENCOUNTER — Encounter: Payer: Self-pay | Admitting: Family Medicine

## 2018-07-24 NOTE — Telephone Encounter (Signed)
Spoke with patient she stated she does not want to have the visit at this time. She states she will wait until she has her physical. She states she does not take the lexapro, xanax.  I have d/c medication

## 2018-07-27 ENCOUNTER — Encounter: Payer: 59 | Admitting: Family Medicine

## 2018-09-01 DIAGNOSIS — N760 Acute vaginitis: Secondary | ICD-10-CM | POA: Diagnosis not present

## 2018-09-26 MED FILL — BLISOVI 24 FE 1-20 MG-MCG(2: 1-20 | 84 days supply | Qty: 84 | Fill #0

## 2018-11-13 ENCOUNTER — Encounter: Payer: Self-pay | Admitting: Family Medicine

## 2018-11-16 ENCOUNTER — Other Ambulatory Visit: Payer: Self-pay

## 2018-11-23 ENCOUNTER — Ambulatory Visit (INDEPENDENT_AMBULATORY_CARE_PROVIDER_SITE_OTHER): Payer: 59 | Admitting: Family Medicine

## 2018-11-23 ENCOUNTER — Encounter

## 2018-11-23 ENCOUNTER — Encounter: Payer: Self-pay | Admitting: Family Medicine

## 2018-11-23 ENCOUNTER — Other Ambulatory Visit: Payer: Self-pay

## 2018-11-23 VITALS — BP 118/80 | HR 77 | Temp 97.6°F | Resp 18 | Wt 144.4 lb

## 2018-11-23 DIAGNOSIS — Z0000 Encounter for general adult medical examination without abnormal findings: Secondary | ICD-10-CM | POA: Diagnosis not present

## 2018-11-23 DIAGNOSIS — E785 Hyperlipidemia, unspecified: Secondary | ICD-10-CM | POA: Diagnosis not present

## 2018-11-23 DIAGNOSIS — Z8639 Personal history of other endocrine, nutritional and metabolic disease: Secondary | ICD-10-CM

## 2018-11-23 NOTE — Progress Notes (Addendum)
+   Subjective:    Patient ID: Stacy Dennis, female    DOB: 14-Apr-1980, 38 y.o.   MRN: [id number]  No chief complaint on file.   HPI Patient is in today for annual preventative exam. She is doing well over all. 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 It is going well and she has had no recent febrile illness or hospitalizations. No polyuria or polydipsia. Denies CP/palp/SOB/HA/congestion/fevers/GI or GU c/o. Taking meds as prescribed  Past Medical History:  Diagnosis Date  . Abnormal menses 06/17/2015  . Allergic state 01/28/2012  . Anxiety 07/06/2016  . Dermatitis 06/17/2015  . Endometriosis   . H/O varicella   . Hypercholesteremia    no meds  . Hyperlipidemia 01/28/2012  . Pregnancy related carpal tunnel syndrome, antepartum 2013  . Preventative health care 01/28/2012  . Verrucous skin lesion 01/28/2012    Past Surgical History:  Procedure Laterality Date  . CESAREAN SECTION  10-10-11  . CESAREAN SECTION N/A 12/10/2013   Procedure: CESAREAN SECTION;  Surgeon: Marylynn Pearson, MD;  Location: [facility];  Service: Obstetrics;  Laterality: N/A;  Repeat edc 9/27  . EYE SURGERY  2012   lasik  . MYRINGOTOMY      Family History  Problem Relation Age of Onset  . Anxiety disorder Mother   . Hyperlipidemia Mother   . Hypertension Mother   . Irritable bowel syndrome Mother        ?  . Kidney disease Father        stones  . Hyperlipidemia Father   . Cancer Paternal Grandfather        lung- smoker  . Stroke Paternal Grandfather   . Aneurysm Paternal Grandfather   . Hyperlipidemia Paternal Grandmother   . Hypertension Maternal Grandmother   . Heart disease Maternal Grandfather   . Thyroid disease Maternal Aunt   . Crohn's disease Paternal Aunt     Social History   Socioeconomic History  . Marital status: Married    Spouse name: Not on file  . Number of children: Not on file  . Years of education:  Not on file  . Highest education level: Not on file  Occupational History  . Not on file  Social Needs  . Financial resource strain: Not on file  . Food insecurity    Worry: Not on file    Inability: Not on file  . Transportation needs    Medical: Not on file    Non-medical: Not on file  Tobacco Use  . Smoking status: Never Smoker  . Smokeless tobacco: Never Used  Substance and Sexual Activity  . Alcohol use: Yes    Comment: socially when not pregnant  . Drug use: No  . Sexual activity: Yes    Partners: Male    Birth control/protection: None  Lifestyle  . Physical activity    Days per week: Not on file    Minutes per session: Not on file  . Stress: Not on file  Relationships  . Social Herbalist on phone: Not on file    Gets together: Not on file    Attends religious service: Not on file    Active member of club or organization: Not on file    Attends meetings of clubs or organizations: Not on file    Relationship status: Not on file  . Intimate partner violence    Fear  of current or ex partner: Not on file    Emotionally abused: Not on file    Physically abused: Not on file    Forced sexual activity: Not on file  Other Topics Concern  . Not on file  Social History Narrative  . Not on file    Outpatient Medications Prior to Visit  Medication Sig Dispense Refill  . LARIN 24 FE 1-20 MG-MCG(24) tablet Take 1 tablet by mouth daily.   4   No facility-administered medications prior to visit.     Allergies  Allergen Reactions  . Amoxicillin Nausea And Vomiting  . Lactose Intolerance (Gi)     Review of Systems  Constitutional: Negative for chills, fever and malaise/fatigue.  HENT: Negative for congestion and hearing loss.   Eyes: Negative for discharge.  Respiratory: Negative for cough, sputum production and shortness of breath.   Cardiovascular: Negative for chest pain, palpitations and leg swelling.  Gastrointestinal: Negative for abdominal pain,  blood in stool, constipation, diarrhea, heartburn, nausea and vomiting.  Genitourinary: Negative for dysuria, frequency, hematuria and urgency.  Musculoskeletal: Negative for back pain, falls and myalgias.  Skin: Negative for rash.  Neurological: Negative for dizziness, sensory change, loss of consciousness, weakness and headaches.  Endo/Heme/Allergies: Negative for environmental allergies. Does not bruise/bleed easily.  Psychiatric/Behavioral: Negative for depression and suicidal ideas. The patient is nervous/anxious. The patient does not have insomnia.        Objective:    Physical Exam Constitutional:      General: She is not in acute distress.    Appearance: She is well-developed.  HENT:     Head: Normocephalic and atraumatic.  Eyes:     Conjunctiva/sclera: Conjunctivae normal.  Neck:     Musculoskeletal: Neck supple.     Thyroid: No thyromegaly.  Cardiovascular:     Rate and Rhythm: Normal rate and regular rhythm.     Heart sounds: Normal heart sounds. No murmur.  Pulmonary:     Effort: Pulmonary effort is normal. No respiratory distress.     Breath sounds: Normal breath sounds.  Abdominal:     General: Bowel sounds are normal. There is no distension.     Palpations: Abdomen is soft. There is no mass.     Tenderness: There is no abdominal tenderness.  Lymphadenopathy:     Cervical: No cervical adenopathy.  Skin:    General: Skin is warm and dry.  Neurological:     Mental Status: She is alert and oriented to person, place, and time.  Psychiatric:        Behavior: Behavior normal.     BP 118/80 (BP Location: Left Arm, Patient Position: Sitting, Cuff Size: Normal)   Pulse 77   Temp 97.6 F (36.4 C) (Oral)   Resp 18   Wt 144 lb 6.4 oz (65.5 kg)   SpO2 98%   BMI 29.17 kg/m  Wt Readings from Last 3 Encounters:  11/23/18 144 lb 6.4 oz (65.5 kg)  09/06/17 140 lb (63.5 kg)  07/21/17 149 lb 9.6 oz (67.9 kg)    Diabetic Foot Exam - Simple   No data filed      Lab Results  Component Value Date   WBC 8.9 07/21/2017   HGB 13.2 07/21/2017   HCT 38.1 07/21/2017   PLT 377.0 07/21/2017   GLUCOSE 91 07/21/2017   CHOL 239 (H) 07/21/2017   TRIG 96.0 07/21/2017   HDL 65.70 07/21/2017   LDLDIRECT 152.7 02/03/2012   LDLCALC 154 (H) 07/21/2017  ALT 20 07/21/2017   AST 23 07/21/2017   NA 137 07/21/2017   K 3.8 07/21/2017   CL 102 07/21/2017   CREATININE 0.70 07/21/2017   BUN 15 07/21/2017   CO2 28 07/21/2017   TSH 1.24 07/21/2017    Lab Results  Component Value Date   TSH 1.24 07/21/2017   Lab Results  Component Value Date   WBC 8.9 07/21/2017   HGB 13.2 07/21/2017   HCT 38.1 07/21/2017   MCV 87.4 07/21/2017   PLT 377.0 07/21/2017   Lab Results  Component Value Date   NA 137 07/21/2017   K 3.8 07/21/2017   CO2 28 07/21/2017   GLUCOSE 91 07/21/2017   BUN 15 07/21/2017   CREATININE 0.70 07/21/2017   BILITOT 0.5 07/21/2017   ALKPHOS 49 07/21/2017   AST 23 07/21/2017   ALT 20 07/21/2017   PROT 6.7 07/21/2017   ALBUMIN 4.1 07/21/2017   CALCIUM 9.2 07/21/2017   GFR 99.99 07/21/2017   Lab Results  Component Value Date   CHOL 239 (H) 07/21/2017   Lab Results  Component Value Date   HDL 65.70 07/21/2017   Lab Results  Component Value Date   LDLCALC 154 (H) 07/21/2017   Lab Results  Component Value Date   TRIG 96.0 07/21/2017   Lab Results  Component Value Date   CHOLHDL 4 07/21/2017   No results found for: HGBA1C     Assessment & Plan:   Problem List Items Addressed This Visit    Hyperlipidemia   Relevant Orders   Lipid panel   Preventative health care - Primary    Patient encouraged to maintain heart healthy diet, regular exercise, adequate sleep. Consider daily probiotics. Take medications as prescribed. Labs ordered and reviewed. Also follows with gynecology      Relevant Orders   CBC   Comprehensive metabolic panel   Lipid panel   TSH   Hx of thyroid disease   Relevant Orders   CBC   Comprehensive  metabolic panel   TSH      I am having Abygale M. Steelman maintain her Larin 24 FE.  No orders of the defined types were placed in this encounter.    Danise EdgeStacey Asya Derryberry, MD

## 2018-11-23 NOTE — Patient Instructions (Addendum)
Blood pressure cuff, upper arm Pulse oximeter  Check vitals weekly  Preventive Care 38-38 Years Old, Female Preventive care refers to visits with your health care provider and lifestyle choices that can promote health and wellness. This includes:  A yearly physical exam. This may also be called an annual well check.  Regular dental visits and eye exams.  Immunizations.  Screening for certain conditions.  Healthy lifestyle choices, such as eating a healthy diet, getting regular exercise, not using drugs or products that contain nicotine and tobacco, and limiting alcohol use. What can I expect for my preventive care visit? Physical exam Your health care provider will check your:  Height and weight. This may be used to calculate body mass index (BMI), which tells if you are at a healthy weight.  Heart rate and blood pressure.  Skin for abnormal spots. Counseling Your health care provider may ask you questions about your:  Alcohol, tobacco, and drug use.  Emotional well-being.  Home and relationship well-being.  Sexual activity.  Eating habits.  Work and work Statistician.  Method of birth control.  Menstrual cycle.  Pregnancy history. What immunizations do I need?  Influenza (flu) vaccine  This is recommended every year. Tetanus, diphtheria, and pertussis (Tdap) vaccine  You may need a Td booster every 10 years. Varicella (chickenpox) vaccine  You may need this if you have not been vaccinated. Human papillomavirus (HPV) vaccine  If recommended by your health care provider, you may need three doses over 6 months. Measles, mumps, and rubella (MMR) vaccine  You may need at least one dose of MMR. You may also need a second dose. Meningococcal conjugate (MenACWY) vaccine  One dose is recommended if you are age 31-21 years and a first-year college student living in a residence hall, or if you have one of several medical conditions. You may also need additional  booster doses. Pneumococcal conjugate (PCV13) vaccine  You may need this if you have certain conditions and were not previously vaccinated. Pneumococcal polysaccharide (PPSV23) vaccine  You may need one or two doses if you smoke cigarettes or if you have certain conditions. Hepatitis A vaccine  You may need this if you have certain conditions or if you travel or work in places where you may be exposed to hepatitis A. Hepatitis B vaccine  You may need this if you have certain conditions or if you travel or work in places where you may be exposed to hepatitis B. Haemophilus influenzae type b (Hib) vaccine  You may need this if you have certain conditions. You may receive vaccines as individual doses or as more than one vaccine together in one shot (combination vaccines). Talk with your health care provider about the risks and benefits of combination vaccines. What tests do I need?  Blood tests  Lipid and cholesterol levels. These may be checked every 5 years starting at age 44.  Hepatitis C test.  Hepatitis B test. Screening  Diabetes screening. This is done by checking your blood sugar (glucose) after you have not eaten for a while (fasting).  Sexually transmitted disease (STD) testing.  BRCA-related cancer screening. This may be done if you have a family history of breast, ovarian, tubal, or peritoneal cancers.  Pelvic exam and Pap test. This may be done every 3 years starting at age 38. Starting at age 9, this may be done every 5 years if you have a Pap test in combination with an HPV test. Talk with your health care provider about your  test results, treatment options, and if necessary, the need for more tests. Follow these instructions at home: Eating and drinking   Eat a diet that includes fresh fruits and vegetables, whole grains, lean protein, and low-fat dairy.  Take vitamin and mineral supplements as recommended by your health care provider.  Do not drink alcohol  if: ? Your health care provider tells you not to drink. ? You are pregnant, may be pregnant, or are planning to become pregnant.  If you drink alcohol: ? Limit how much you have to 0-1 drink a day. ? Be aware of how much alcohol is in your drink. In the [place] one drink equals one 12 oz bottle of beer (355 mL), one 5 oz glass of wine (148 mL), or one 1 oz glass of hard liquor (44 mL). Lifestyle  Take daily care of your teeth and gums.  Stay active. Exercise for at least 30 minutes on 5 or more days each week.  Do not use any products that contain nicotine or tobacco, such as cigarettes, e-cigarettes, and chewing tobacco. If you need help quitting, ask your health care provider.  If you are sexually active, practice safe sex. Use a condom or other form of birth control (contraception) in order to prevent pregnancy and STIs (sexually transmitted infections). If you plan to become pregnant, see your health care provider for a preconception visit. What's next?  Visit your health care provider once a year for a well check visit.  Ask your health care provider how often you should have your eyes and teeth checked.  Stay up to date on all vaccines. This information is not intended to replace advice given to you by your health care provider. Make sure you discuss any questions you have with your health care provider. Document Released: 05/04/2001 Document Revised: 11/17/2017 Document Reviewed: 11/17/2017 Elsevier Patient Education  2020 [employer].

## 2018-11-24 LAB — LIPID PANEL
Cholesterol: 222 mg/dL — ABNORMAL HIGH (ref 0–200)
HDL: 52.8 mg/dL (ref >39.00)
LDL Cholesterol: 140 mg/dL — ABNORMAL HIGH (ref 0–99)
NonHDL: 168.97
Total CHOL/HDL Ratio: 4
Triglycerides: 143 mg/dL (ref 0.0–149.0)
VLDL: 28.6 mg/dL (ref 0.0–40.0)

## 2018-11-24 LAB — COMPREHENSIVE METABOLIC PANEL
ALT: 11 U/L (ref 0–35)
AST: 16 U/L (ref 0–37)
Albumin: 4.2 g/dL (ref 3.5–5.2)
Alkaline Phosphatase: 41 U/L (ref 39–117)
BUN: 13 mg/dL (ref 6–23)
CO2: 28 mEq/L (ref 19–32)
Calcium: 9.1 mg/dL (ref 8.4–10.5)
Chloride: 102 mEq/L (ref 96–112)
Creatinine, Ser: 0.69 mg/dL (ref 0.40–1.20)
GFR: 94.97 mL/min (ref >60.00)
Glucose, Bld: 83 mg/dL (ref 70–99)
Potassium: 4.3 mEq/L (ref 3.5–5.1)
Sodium: 138 mEq/L (ref 135–145)
Total Bilirubin: 0.4 mg/dL (ref 0.2–1.2)
Total Protein: 6.6 g/dL (ref 6.0–8.3)

## 2018-11-24 LAB — TSH: TSH: 1.17 u[IU]/mL (ref 0.35–4.50)

## 2018-11-24 LAB — CBC
HCT: 38.1 % (ref 36.0–46.0)
Hemoglobin: 12.8 g/dL (ref 12.0–15.0)
MCHC: 33.7 g/dL (ref 30.0–36.0)
MCV: 88.9 fl (ref 78.0–100.0)
Platelets: 366 10*3/uL (ref 150.0–400.0)
RBC: 4.28 Mil/uL (ref 3.87–5.11)
RDW: 12.5 % (ref 11.5–15.5)
WBC: 9.3 10*3/uL (ref 4.0–10.5)

## 2018-11-24 NOTE — Assessment & Plan Note (Addendum)
Patient encouraged to maintain heart healthy diet, regular exercise, adequate sleep. Consider daily probiotics. Take medications as prescribed. Labs ordered and reviewed. Also follows with gynecology

## 2018-12-12 MED FILL — BLISOVI 24 FE 1-20 MG-MCG(2: 1-20 | 84 days supply | Qty: 84 | Fill #1

## 2019-02-01 DIAGNOSIS — Z6828 Body mass index (BMI) 28.0-28.9, adult: Secondary | ICD-10-CM | POA: Diagnosis not present

## 2019-02-01 DIAGNOSIS — Z01419 Encounter for gynecological examination (general) (routine) without abnormal findings: Secondary | ICD-10-CM | POA: Diagnosis not present

## 2019-02-01 MED FILL — buPROPion HCL ER (XL) 150 M: 150 | 90 days supply | Qty: 90 | Fill #0

## 2019-02-09 DIAGNOSIS — Z20828 Contact with and (suspected) exposure to other viral communicable diseases: Secondary | ICD-10-CM | POA: Diagnosis not present

## 2019-02-09 DIAGNOSIS — R0982 Postnasal drip: Secondary | ICD-10-CM | POA: Diagnosis not present

## 2019-03-05 ENCOUNTER — Encounter: Payer: Self-pay | Admitting: Family Medicine

## 2019-03-19 MED FILL — BLISOVI 24 FE 1-20 MG-MCG(2: 1-20 | 84 days supply | Qty: 84 | Fill #0

## 2019-04-09 ENCOUNTER — Encounter: Payer: Self-pay | Admitting: Family Medicine

## 2019-05-01 MED FILL — buPROPion HCL ER (XL) 150 M: 150 | 90 days supply | Qty: 90 | Fill #1

## 2019-06-15 MED FILL — BLISOVI 24 FE 1-20 MG-MCG(2: 1-20 | 84 days supply | Qty: 84 | Fill #1

## 2019-07-01 ENCOUNTER — Encounter: Payer: Self-pay | Admitting: Family Medicine

## 2019-08-02 ENCOUNTER — Ambulatory Visit (INDEPENDENT_AMBULATORY_CARE_PROVIDER_SITE_OTHER): Payer: No Typology Code available for payment source | Admitting: Family Medicine

## 2019-08-02 ENCOUNTER — Encounter: Payer: Self-pay | Admitting: Family Medicine

## 2019-08-02 ENCOUNTER — Other Ambulatory Visit: Payer: Self-pay

## 2019-08-02 VITALS — BP 116/80 | HR 85 | Temp 97.9°F | Resp 12 | Ht 59.0 in | Wt 142.4 lb

## 2019-08-02 DIAGNOSIS — E785 Hyperlipidemia, unspecified: Secondary | ICD-10-CM

## 2019-08-02 DIAGNOSIS — F419 Anxiety disorder, unspecified: Secondary | ICD-10-CM

## 2019-08-02 DIAGNOSIS — M25562 Pain in left knee: Secondary | ICD-10-CM

## 2019-08-02 DIAGNOSIS — E663 Overweight: Secondary | ICD-10-CM

## 2019-08-02 DIAGNOSIS — M25561 Pain in right knee: Secondary | ICD-10-CM

## 2019-08-02 DIAGNOSIS — G8929 Other chronic pain: Secondary | ICD-10-CM

## 2019-08-02 DIAGNOSIS — Z0000 Encounter for general adult medical examination without abnormal findings: Secondary | ICD-10-CM | POA: Diagnosis not present

## 2019-08-02 LAB — CBC
HCT: 39.1 % (ref 36.0–46.0)
Hemoglobin: 13.4 g/dL (ref 12.0–15.0)
MCHC: 34.2 g/dL (ref 30.0–36.0)
MCV: 88.9 fl (ref 78.0–100.0)
Platelets: 363 10*3/uL (ref 150.0–400.0)
RBC: 4.4 Mil/uL (ref 3.87–5.11)
RDW: 12.7 % (ref 11.5–15.5)
WBC: 7 10*3/uL (ref 4.0–10.5)

## 2019-08-02 LAB — LIPID PANEL
Cholesterol: 233 mg/dL — ABNORMAL HIGH (ref 0–200)
HDL: 58.5 mg/dL (ref >39.00)
LDL Cholesterol: 155 mg/dL — ABNORMAL HIGH (ref 0–99)
NonHDL: 174.56
Total CHOL/HDL Ratio: 4
Triglycerides: 99 mg/dL (ref 0.0–149.0)
VLDL: 19.8 mg/dL (ref 0.0–40.0)

## 2019-08-02 LAB — COMPREHENSIVE METABOLIC PANEL
ALT: 13 U/L (ref 0–35)
AST: 19 U/L (ref 0–37)
Albumin: 4.3 g/dL (ref 3.5–5.2)
Alkaline Phosphatase: 45 U/L (ref 39–117)
BUN: 13 mg/dL (ref 6–23)
CO2: 26 mEq/L (ref 19–32)
Calcium: 9 mg/dL (ref 8.4–10.5)
Chloride: 103 mEq/L (ref 96–112)
Creatinine, Ser: 0.75 mg/dL (ref 0.40–1.20)
GFR: 85.94 mL/min (ref >60.00)
Glucose, Bld: 91 mg/dL (ref 70–99)
Potassium: 4.4 mEq/L (ref 3.5–5.1)
Sodium: 136 mEq/L (ref 135–145)
Total Bilirubin: 0.6 mg/dL (ref 0.2–1.2)
Total Protein: 6.5 g/dL (ref 6.0–8.3)

## 2019-08-02 LAB — TSH: TSH: 0.98 u[IU]/mL (ref 0.35–4.50)

## 2019-08-02 MED ORDER — BUPROPION HCL ER (XL) 150 MG PO TB24: 150.0000 mg | ORAL_TABLET | Freq: Two times a day (BID) | ORAL | 0 refills | Status: DC

## 2019-08-02 NOTE — Assessment & Plan Note (Signed)
Patient encouraged to maintain heart healthy diet, regular exercise, adequate sleep. Consider daily probiotics. Take medications as prescribed. Labs ordered and reviewed 

## 2019-08-02 NOTE — Assessment & Plan Note (Signed)
Encouraged heart healthy diet, increase exercise, avoid trans fats, consider a krill oil cap daily 

## 2019-08-02 NOTE — Assessment & Plan Note (Signed)
She started Wellbutrin and her anxiety has not gotten worse. It is actually has gotten some better. She relates it to starting to read the bible. She feels when she gets enough sleep she just cannot get it most days. She gets 4-6 hours most days. 3 days a week she has to get up at 4:30. She is encouraged to try and get enough sleep roughly 6-8 hours.

## 2019-08-02 NOTE — Patient Instructions (Signed)
Preventive Care 21-39 Years Old, Female Preventive care refers to visits with your health care provider and lifestyle choices that can promote health and wellness. This includes:  A yearly physical exam. This may also be called an annual well check.  Regular dental visits and eye exams.  Immunizations.  Screening for certain conditions.  Healthy lifestyle choices, such as eating a healthy diet, getting regular exercise, not using drugs or products that contain nicotine and tobacco, and limiting alcohol use. What can I expect for my preventive care visit? Physical exam Your health care provider will check your:  Height and weight. This may be used to calculate body mass index (BMI), which tells if you are at a healthy weight.  Heart rate and blood pressure.  Skin for abnormal spots. Counseling Your health care provider may ask you questions about your:  Alcohol, tobacco, and drug use.  Emotional well-being.  Home and relationship well-being.  Sexual activity.  Eating habits.  Work and work environment.  Method of birth control.  Menstrual cycle.  Pregnancy history. What immunizations do I need?  Influenza (flu) vaccine  This is recommended every year. Tetanus, diphtheria, and pertussis (Tdap) vaccine  You may need a Td booster every 10 years. Varicella (chickenpox) vaccine  You may need this if you have not been vaccinated. Human papillomavirus (HPV) vaccine  If recommended by your health care provider, you may need three doses over 6 months. Measles, mumps, and rubella (MMR) vaccine  You may need at least one dose of MMR. You may also need a second dose. Meningococcal conjugate (MenACWY) vaccine  One dose is recommended if you are age 19-21 years and a first-year college student living in a residence hall, or if you have one of several medical conditions. You may also need additional booster doses. Pneumococcal conjugate (PCV13) vaccine  You may need  this if you have certain conditions and were not previously vaccinated. Pneumococcal polysaccharide (PPSV23) vaccine  You may need one or two doses if you smoke cigarettes or if you have certain conditions. Hepatitis A vaccine  You may need this if you have certain conditions or if you travel or work in places where you may be exposed to hepatitis A. Hepatitis B vaccine  You may need this if you have certain conditions or if you travel or work in places where you may be exposed to hepatitis B. Haemophilus influenzae type b (Hib) vaccine  You may need this if you have certain conditions. You may receive vaccines as individual doses or as more than one vaccine together in one shot (combination vaccines). Talk with your health care provider about the risks and benefits of combination vaccines. What tests do I need?  Blood tests  Lipid and cholesterol levels. These may be checked every 5 years starting at age 20.  Hepatitis C test.  Hepatitis B test. Screening  Diabetes screening. This is done by checking your blood sugar (glucose) after you have not eaten for a while (fasting).  Sexually transmitted disease (STD) testing.  BRCA-related cancer screening. This may be done if you have a family history of breast, ovarian, tubal, or peritoneal cancers.  Pelvic exam and Pap test. This may be done every 3 years starting at age 21. Starting at age 30, this may be done every 5 years if you have a Pap test in combination with an HPV test. Talk with your health care provider about your test results, treatment options, and if necessary, the need for more tests.   Follow these instructions at home: Eating and drinking   Eat a diet that includes fresh fruits and vegetables, whole grains, lean protein, and low-fat dairy.  Take vitamin and mineral supplements as recommended by your health care provider.  Do not drink alcohol if: ? Your health care provider tells you not to drink. ? You are  pregnant, may be pregnant, or are planning to become pregnant.  If you drink alcohol: ? Limit how much you have to 0-1 drink a day. ? Be aware of how much alcohol is in your drink. In the [place] one drink equals one 12 oz bottle of beer (355 mL), one 5 oz glass of wine (148 mL), or one 1 oz glass of hard liquor (44 mL). Lifestyle  Take daily care of your teeth and gums.  Stay active. Exercise for at least 30 minutes on 5 or more days each week.  Do not use any products that contain nicotine or tobacco, such as cigarettes, e-cigarettes, and chewing tobacco. If you need help quitting, ask your health care provider.  If you are sexually active, practice safe sex. Use a condom or other form of birth control (contraception) in order to prevent pregnancy and STIs (sexually transmitted infections). If you plan to become pregnant, see your health care provider for a preconception visit. What's next?  Visit your health care provider once a year for a well check visit.  Ask your health care provider how often you should have your eyes and teeth checked.  Stay up to date on all vaccines. This information is not intended to replace advice given to you by your health care provider. Make sure you discuss any questions you have with your health care provider. Document Revised: 11/17/2017 Document Reviewed: 11/17/2017 Elsevier Patient Education  2020 [employer].

## 2019-08-02 NOTE — Progress Notes (Signed)
Subjective:    Patient ID: Stacy Dennis, female    DOB: 04-27-1980, 39 y.o.   MRN: [id number]  Chief Complaint  Patient presents with  . Annual Exam    HPI Patient is in today for annual preventative exam. She is doing well. No recent febrile illness or hospitalizations. Her kids are doing well. They are in school and doing sports. Her OB/GYN switched her to Wellbutrin to help her manage her stress and suppress her appetite and while she feels it helped some she is frustrated with the slow pace of weight loss despite all of her efforts. She is exercising regularly. She is struggling with bilateral knee pain but denies any trauma or falls. No redness, warmth, or swelling. She describes an achy feeling and difficulty going down stairs. 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 She tolerated her COVID shots well. Denies CP/palp/SOB/HA/congestion/fevers/GI or GU c/o. Taking meds as prescribed.   Past Medical History:  Diagnosis Date  . Abnormal menses 06/17/2015  . Allergic state 01/28/2012  . Anxiety 07/06/2016  . Dermatitis 06/17/2015  . Endometriosis   . H/O varicella   . Hypercholesteremia    no meds  . Hyperlipidemia 01/28/2012  . Pregnancy related carpal tunnel syndrome, antepartum 2013  . Preventative health care 01/28/2012  . Verrucous skin lesion 01/28/2012    Past Surgical History:  Procedure Laterality Date  . CESAREAN SECTION  10-10-11  . CESAREAN SECTION N/A 12/10/2013   Procedure: CESAREAN SECTION;  Surgeon: Zelphia Cairo, MD;  Location: [facility];  Service: Obstetrics;  Laterality: N/A;  Repeat edc 9/27  . EYE SURGERY  2012   lasik  . MYRINGOTOMY      Family History  Problem Relation Age of Onset  . Anxiety disorder Mother   . Hyperlipidemia Mother   . Hypertension Mother   . Irritable bowel syndrome Mother        ?  . Kidney disease Father        stones  . Hyperlipidemia Father   . Cancer Paternal Grandfather        lung-  smoker  . Stroke Paternal Grandfather   . Aneurysm Paternal Grandfather   . Hyperlipidemia Paternal Grandmother   . Hypertension Maternal Grandmother   . Heart disease Maternal Grandfather   . Thyroid disease Maternal Aunt   . Crohn's disease Paternal Aunt     Social History   Socioeconomic History  . Marital status: Married    Spouse name: Not on file  . Number of children: Not on file  . Years of education: Not on file  . Highest education level: Not on file  Occupational History  . Not on file  Tobacco Use  . Smoking status: Never Smoker  . Smokeless tobacco: Never Used  Substance and Sexual Activity  . Alcohol use: Yes    Comment: socially when not pregnant  . Drug use: No  . Sexual activity: Yes    Partners: Male    Birth control/protection: None  Other Topics Concern  . Not on file  Social History Narrative  . Not on file   Social Determinants of Health   Financial Resource Strain:   . Difficulty of Paying Living Expenses:   Food Insecurity:   . Worried About Programme researcher, broadcasting/film/video in the Last Year:   . Barista in the Last Year:   Transportation Needs:   . Freight forwarder (Medical):   Marland Kitchen  Lack of Transportation (Non-Medical):   Physical Activity:   . Days of Exercise per Week:   . Minutes of Exercise per Session:   Stress:   . Feeling of Stress :   Social Connections:   . Frequency of Communication with Friends and Family:   . Frequency of Social Gatherings with Friends and Family:   . Attends Religious Services:   . Active Member of Clubs or Organizations:   . Attends Archivist Meetings:   Marland Kitchen Marital Status:   Intimate Partner Violence:   . Fear of Current or Ex-Partner:   . Emotionally Abused:   Marland Kitchen Physically Abused:   . Sexually Abused:     Outpatient Medications Prior to Visit  Medication Sig Dispense Refill  . Norethindrone Acetate-Ethinyl Estrad-FE (BLISOVI 24 FE) 1-20 MG-MCG(24) tablet Take 1 tablet by mouth daily. 1  Package   . buPROPion (WELLBUTRIN XL) 150 MG 24 hr tablet Take 150 mg by mouth at bedtime.    . Norethindrone Acetate-Ethinyl Estrad-FE (BLISOVI 24 FE) 1-20 MG-MCG(24) tablet Take 1 tablet by mouth daily.    Marland Kitchen LARIN 24 FE 1-20 MG-MCG(24) tablet Take 1 tablet by mouth daily.   4   No facility-administered medications prior to visit.    Allergies  Allergen Reactions  . Amoxicillin Nausea And Vomiting  . Lactose Intolerance (Gi)     Review of Systems  Constitutional: Negative for chills, fever and malaise/fatigue.  HENT: Negative for congestion and hearing loss.   Eyes: Negative for discharge.  Respiratory: Negative for cough, sputum production and shortness of breath.   Cardiovascular: Negative for chest pain, palpitations and leg swelling.  Gastrointestinal: Negative for abdominal pain, blood in stool, constipation, diarrhea, heartburn, nausea and vomiting.  Genitourinary: Negative for dysuria, frequency, hematuria and urgency.  Musculoskeletal: Positive for joint pain. Negative for back pain, falls and myalgias.  Skin: Negative for rash.  Neurological: Negative for dizziness, sensory change, loss of consciousness, weakness and headaches.  Endo/Heme/Allergies: Negative for environmental allergies. Does not bruise/bleed easily.  Psychiatric/Behavioral: Negative for depression and suicidal ideas. The patient does not have insomnia.        Objective:    Physical Exam Constitutional:      General: She is not in acute distress.    Appearance: She is well-developed.  HENT:     Head: Normocephalic and atraumatic.  Eyes:     Conjunctiva/sclera: Conjunctivae normal.  Neck:     Thyroid: No thyromegaly.  Cardiovascular:     Rate and Rhythm: Normal rate and regular rhythm.     Heart sounds: Normal heart sounds. No murmur.  Pulmonary:     Effort: Pulmonary effort is normal. No respiratory distress.     Breath sounds: Normal breath sounds.  Abdominal:     General: Bowel sounds are  normal. There is no distension.     Palpations: Abdomen is soft. There is no mass.     Tenderness: There is no abdominal tenderness.  Musculoskeletal:     Cervical back: Neck supple.  Lymphadenopathy:     Cervical: No cervical adenopathy.  Skin:    General: Skin is warm and dry.  Neurological:     Mental Status: She is alert and oriented to person, place, and time.  Psychiatric:        Behavior: Behavior normal.     BP 116/80 (BP Location: Right Arm, Cuff Size: Normal)   Pulse 85   Temp 97.9 F (36.6 C) (Temporal)   Resp 12  Ht 4\' 11"  (1.499 m)   Wt 142 lb 6.4 oz (64.6 kg)   SpO2 98%   BMI 28.76 kg/m  Wt Readings from Last 3 Encounters:  08/02/19 142 lb 6.4 oz (64.6 kg)  11/23/18 144 lb 6.4 oz (65.5 kg)  09/06/17 140 lb (63.5 kg)    Diabetic Foot Exam - Simple   No data filed     Lab Results  Component Value Date   WBC 7.0 08/02/2019   HGB 13.4 08/02/2019   HCT 39.1 08/02/2019   PLT 363.0 08/02/2019   GLUCOSE 91 08/02/2019   CHOL 233 (H) 08/02/2019   TRIG 99.0 08/02/2019   HDL 58.50 08/02/2019   LDLDIRECT 152.7 02/03/2012   LDLCALC 155 (H) 08/02/2019   ALT 13 08/02/2019   AST 19 08/02/2019   NA 136 08/02/2019   K 4.4 08/02/2019   CL 103 08/02/2019   CREATININE 0.75 08/02/2019   BUN 13 08/02/2019   CO2 26 08/02/2019   TSH 0.98 08/02/2019    Lab Results  Component Value Date   TSH 0.98 08/02/2019   Lab Results  Component Value Date   WBC 7.0 08/02/2019   HGB 13.4 08/02/2019   HCT 39.1 08/02/2019   MCV 88.9 08/02/2019   PLT 363.0 08/02/2019   Lab Results  Component Value Date   NA 136 08/02/2019   K 4.4 08/02/2019   CO2 26 08/02/2019   GLUCOSE 91 08/02/2019   BUN 13 08/02/2019   CREATININE 0.75 08/02/2019   BILITOT 0.6 08/02/2019   ALKPHOS 45 08/02/2019   AST 19 08/02/2019   ALT 13 08/02/2019   PROT 6.5 08/02/2019   ALBUMIN 4.3 08/02/2019   CALCIUM 9.0 08/02/2019   GFR 85.94 08/02/2019   Lab Results  Component Value Date   CHOL  233 (H) 08/02/2019   Lab Results  Component Value Date   HDL 58.50 08/02/2019   Lab Results  Component Value Date   LDLCALC 155 (H) 08/02/2019   Lab Results  Component Value Date   TRIG 99.0 08/02/2019   Lab Results  Component Value Date   CHOLHDL 4 08/02/2019   No results found for: HGBA1C     Assessment & Plan:   Problem List Items Addressed This Visit    Hyperlipidemia - Primary    Encouraged heart healthy diet, increase exercise, avoid trans fats, consider a krill oil cap daily      Relevant Orders   Lipid panel (Completed)   Preventative health care    Patient encouraged to maintain heart healthy diet, regular exercise, adequate sleep. Consider daily probiotics. Take medications as prescribed. Labs ordered and reviewed      Relevant Orders   CBC (Completed)   Comprehensive metabolic panel (Completed)   TSH (Completed)   Novel Coronavirus, NAA ([facility])   SARS-COV-2 IgG (Completed)   Anxiety    She started Wellbutrin and her anxiety has not gotten worse. It is actually has gotten some better. She relates it to starting to read the bible. She feels when she gets enough sleep she just cannot get it most days. She gets 4-6 hours most days. 3 days a week she has to get up at 4:30. She is encouraged to try and get enough sleep roughly 6-8 hours.       Relevant Medications   buPROPion (WELLBUTRIN XL) 150 MG 24 hr tablet   Other Relevant Orders   SARS-COV-2 IgG (Completed)   Chronic pain of both knees    No trauma  but her knees have been more achy and going down stairs is increasingly difficult. She is encouraged to keep exercising but is referred to Sports Medicine for further evaluation.       Relevant Medications   buPROPion (WELLBUTRIN XL) 150 MG 24 hr tablet   Other Relevant Orders   Ambulatory referral to Sports Medicine   Overweight (BMI 25.0-29.9)    She is exercising regularly and eating a heart healthy diet and while she has lost some weight she is  frustrated with not being able to loose it around the abdomen. Encouraged MIND and/or heart healthy diet, decrease po intake and increase exercise as tolerated. Needs 7-8 hours of sleep nightly. Avoid trans fats, eat small, frequent meals every 4-5 hours with lean proteins, complex carbs and healthy fats. Minimize simple carbs and processed foods.          I have changed Amirra M. Knoche's buPROPion. I am also having her maintain her Blisovi 24 Fe.  Meds ordered this encounter  Medications  . buPROPion (WELLBUTRIN XL) 150 MG 24 hr tablet    Sig: Take 1 tablet (150 mg total) by mouth 2 (two) times daily.    Dispense:  180 tablet    Refill:  0     Danise Edge, MD

## 2019-08-03 ENCOUNTER — Encounter: Payer: Self-pay | Admitting: Family Medicine

## 2019-08-03 LAB — SARS-COV-2 IGG: SARS-COV-2 IgG: 3.26

## 2019-08-05 DIAGNOSIS — E663 Overweight: Secondary | ICD-10-CM | POA: Insufficient documentation

## 2019-08-05 DIAGNOSIS — G8929 Other chronic pain: Secondary | ICD-10-CM | POA: Insufficient documentation

## 2019-08-05 NOTE — Assessment & Plan Note (Signed)
She is exercising regularly and eating a heart healthy diet and while she has lost some weight she is frustrated with not being able to loose it around the abdomen. Encouraged MIND and/or heart healthy diet, decrease po intake and increase exercise as tolerated. Needs 7-8 hours of sleep nightly. Avoid trans fats, eat small, frequent meals every 4-5 hours with lean proteins, complex carbs and healthy fats. Minimize simple carbs and processed foods.

## 2019-08-05 NOTE — Assessment & Plan Note (Signed)
No trauma but her knees have been more achy and going down stairs is increasingly difficult. She is encouraged to keep exercising but is referred to Sports Medicine for further evaluation.

## 2019-08-07 ENCOUNTER — Ambulatory Visit (INDEPENDENT_AMBULATORY_CARE_PROVIDER_SITE_OTHER): Payer: No Typology Code available for payment source | Admitting: Family Medicine

## 2019-08-07 ENCOUNTER — Other Ambulatory Visit: Payer: Self-pay

## 2019-08-07 ENCOUNTER — Ambulatory Visit: Payer: Self-pay

## 2019-08-07 ENCOUNTER — Encounter: Payer: Self-pay | Admitting: Family Medicine

## 2019-08-07 VITALS — BP 120/84 | HR 85 | Ht 59.0 in | Wt 145.2 lb

## 2019-08-07 DIAGNOSIS — M25562 Pain in left knee: Secondary | ICD-10-CM | POA: Diagnosis not present

## 2019-08-07 DIAGNOSIS — M25561 Pain in right knee: Secondary | ICD-10-CM | POA: Diagnosis not present

## 2019-08-07 DIAGNOSIS — G8929 Other chronic pain: Secondary | ICD-10-CM

## 2019-08-07 NOTE — Progress Notes (Signed)
Subjective:    I'm seeing this patient as a consultation for:  Dr. Charlett Blake. Note will be routed back to referring provider/PCP.  CC: B knee pain  I, Molly Weber, LAT, ATC, am serving as scribe for Dr. Lynne Leader.  HPI: Pt is a 39 y/o female presenting w/ c/o chronic B knee pain.  She locates her pain to her anterior knees.  She rates her pain as moderate to severe and describes her pain as aching and pressure. She works as an inpatient physical therapist. She has been increasing her activity recently. She does high intensity interval training and Peloton cycling most of the days of the week. She notes she tends to have anterior knee pain with loaded knee with knee flexion.  Radiating pain:No Knee swelling: No Knee mechanical symptoms: yes Aggravating factors: descending stairs; pain after activity such as running and HIIT Treatments tried: turmeric; orthotics made by Dr. Oneida Alar approximately 6 years ago  Past medical history, Surgical history, Family history, Social history, Allergies, and medications have been entered into the medical record, reviewed.   Review of Systems: No new headache, visual changes, nausea, vomiting, diarrhea, constipation, dizziness, abdominal pain, skin rash, fevers, chills, night sweats, weight loss, swollen lymph nodes, body aches, joint swelling, muscle aches, chest pain, shortness of breath, mood changes, visual or auditory hallucinations.   Objective:    Vitals:   08/07/19 1429  BP: 120/84  Pulse: 85  SpO2: 97%   General: Well Developed, well nourished, and in no acute distress.  . MSK:  Right knee normal-appearing no effusion. Slight decreased VMO bulk compared to lateral aspect of knee. Range of motion 0-120 degrees with crepitation. Nontender. Stable ligamentous exam to valgus and varus and anterior posterior drawer testing. Slight clunk palpated with knee rotation as part of McMurray's test without pain. Intact flexion and extension  strength.  Left knee: Normal-appearing without effusion. Again slight decreased VMO bulk compared to lateral aspect of knee. Range of motion 0-120 degrees with crepitation. Nontender. Stable ligamentous exam. Negative Murray's test. Intact flexion extension strength.  Hip abduction strength bilaterally is diminished 4/5. External rotation strength bilaterally is intact 5/5  Lab and Radiology Results  Diagnostic Limited MSK Ultrasound of: Right knee Quad tendon intact with osteophyte at superior pole of patella quad tendon insertion. Trace joint effusion present. Patellar tendon intact normal-appearing Slightly narrowed medial joint line with osteophyte present Lateral joint line normal. No clear meniscus tear visible medial or lateral. Impression: Osteophyte superior pole patella  Diagnostic Limited MSK Ultrasound of: Left knee Quad tendon intact normal-appearing Trace joint effusion present. Patellar tendon normal-appearing Slightly narrowed medial joint line with osteophyte present Lateral joint line normal. No clear meniscus tear medial or lateral. Impression: Largely normal ultrasound knee    Impression and Recommendations:    Assessment and Plan: 39 y.o. female with bilateral anterior knee pain. Patient does have patellofemoral chondromalacia on exam and is having pain that is consistent with patellofemoral pain. She does have some targets for improvement including increasing VMO strength and increasing hip abduction strength. Additionally reasonable to try Voltaren gel. Certainly if not improving could do more including x-rays trial injections and ultimately MRI.  Patient is a physical therapist. I believe that she is probably going to be able to do the home exercises by herself however could always have an actual physical therapy referral if needed in the future.  Watchful waiting for now..   Orders Placed This Encounter  Procedures  . [place] LIMITED JOINT SPACE  STRUCTURES LOW BILAT(NO LINKED CHARGES)    Order Specific Question:   Reason for Exam (SYMPTOM  OR DIAGNOSIS REQUIRED)    Answer:   B knee pain    Order Specific Question:   Preferred imaging location?    Answer:    [facility]   No orders of the defined types were placed in this encounter.   Discussed warning signs or symptoms. Please see discharge instructions. Patient expresses understanding.   The above documentation has been reviewed and is accurate and complete Clementeen Graham, M.D.

## 2019-08-07 NOTE — Patient Instructions (Signed)
Thank you for coming in today. Plan for VMO and hip abductor strength. Ok to voltaren gel over the counter.  Ok to try k-tape.  Keep me updated.  Would recommend xrays if not improving.   Could do more if needed.   My chart is great for discussion next steps.

## 2019-11-15 ENCOUNTER — Other Ambulatory Visit: Payer: Self-pay

## 2019-11-15 ENCOUNTER — Telehealth (INDEPENDENT_AMBULATORY_CARE_PROVIDER_SITE_OTHER): Payer: No Typology Code available for payment source | Admitting: Family Medicine

## 2019-11-15 DIAGNOSIS — R41840 Attention and concentration deficit: Secondary | ICD-10-CM | POA: Diagnosis not present

## 2019-11-15 DIAGNOSIS — F419 Anxiety disorder, unspecified: Secondary | ICD-10-CM

## 2019-11-15 MED ORDER — BUPROPION HCL ER (XL) 150 MG PO TB24: 150.0000 mg | ORAL_TABLET | Freq: Every day | ORAL | 1 refills | Status: DC

## 2019-11-16 ENCOUNTER — Encounter: Payer: Self-pay | Admitting: Family Medicine

## 2019-11-18 ENCOUNTER — Other Ambulatory Visit: Payer: Self-pay | Admitting: Family Medicine

## 2019-11-18 DIAGNOSIS — L578 Other skin changes due to chronic exposure to nonionizing radiation: Secondary | ICD-10-CM

## 2019-11-20 ENCOUNTER — Other Ambulatory Visit: Payer: Self-pay | Admitting: Family Medicine

## 2019-11-20 DIAGNOSIS — R41840 Attention and concentration deficit: Secondary | ICD-10-CM

## 2019-11-20 NOTE — Assessment & Plan Note (Addendum)
Had stopped the Wellbutrin but has had increased stressors working up her son's diagnosis of ADD and her mother having breast cancer. She is willing to restart Wellbutrin at 150 mg daily and may increase to 300 mg daily as needed. Referred to behavioral health for counseling as well. Spent 20 minutes discussing case, counseling patient and developing plan of care.

## 2019-11-20 NOTE — Progress Notes (Signed)
Virtual Visit via Video Note  I connected with Stacy Dennis on 11/15/19 at  2:20 PM EDT by a video enabled telemedicine application and verified that I am speaking with the correct person using two identifiers.  Location: Patient: home Provider: office   I discussed the limitations of evaluation and management by telemedicine and the availability of in person appointments. The patient expressed understanding and agreed to proceed. Thelma Barge, CMA was able to get the patient set up on a video visit    Subjective:    Patient ID: Stacy Dennis, female    DOB: 1980-07-31, 39 y.o.   MRN: [id number]  Chief Complaint  Patient presents with  . 3 month follow up poss. anxiety like symptoms    HPI Patient is in today for follow up on chronic medical concerns and to discuss worsening anxiety and anhedonia. She has been under a great deal of stress with her mother's diagnosis of breast cancer, her 8 yo son's school struggle with ADD and the pandemic. She is having trouble quieting her thoughts and settlign down. Notes anhedonia but denies any suicidal ideation. Denies CP/palp/SOB/HA/congestion/fevers/GI or GU c/o. Taking meds as prescribed  Past Medical History:  Diagnosis Date  . Abnormal menses 06/17/2015  . Allergic state 01/28/2012  . Anxiety 07/06/2016  . Dermatitis 06/17/2015  . Endometriosis   . H/O varicella   . Hypercholesteremia    no meds  . Hyperlipidemia 01/28/2012  . Pregnancy related carpal tunnel syndrome, antepartum 2013  . Preventative health care 01/28/2012  . Verrucous skin lesion 01/28/2012    Past Surgical History:  Procedure Laterality Date  . CESAREAN SECTION  10-10-11  . CESAREAN SECTION N/A 12/10/2013   Procedure: CESAREAN SECTION;  Surgeon: Zelphia Cairo, MD;  Location: [facility];  Service: Obstetrics;  Laterality: N/A;  Repeat edc 9/27  . EYE SURGERY  2012   lasik  . MYRINGOTOMY      Family History  Problem Relation Age of Onset  . Anxiety  disorder Mother   . Hyperlipidemia Mother   . Hypertension Mother   . Irritable bowel syndrome Mother        ?  . Kidney disease Father        stones  . Hyperlipidemia Father   . Cancer Paternal Grandfather        lung- smoker  . Stroke Paternal Grandfather   . Aneurysm Paternal Grandfather   . Hyperlipidemia Paternal Grandmother   . Hypertension Maternal Grandmother   . Heart disease Maternal Grandfather   . Thyroid disease Maternal Aunt   . Crohn's disease Paternal Aunt     Social History   Socioeconomic History  . Marital status: Married    Spouse name: Not on file  . Number of children: Not on file  . Years of education: Not on file  . Highest education level: Not on file  Occupational History  . Not on file  Tobacco Use  . Smoking status: Never Smoker  . Smokeless tobacco: Never Used  Substance and Sexual Activity  . Alcohol use: Yes    Comment: socially when not pregnant  . Drug use: No  . Sexual activity: Yes    Partners: Male    Birth control/protection: None  Other Topics Concern  . Not on file  Social History Narrative  . Not on file   Social Determinants of Health   Financial Resource Strain:   . Difficulty of Paying Living Expenses: Not on file  Food Insecurity:   .  Worried About Programme researcher, broadcasting/film/video in the Last Year: Not on file  . Ran Out of Food in the Last Year: Not on file  Transportation Needs:   . Lack of Transportation (Medical): Not on file  . Lack of Transportation (Non-Medical): Not on file  Physical Activity:   . Days of Exercise per Week: Not on file  . Minutes of Exercise per Session: Not on file  Stress:   . Feeling of Stress : Not on file  Social Connections:   . Frequency of Communication with Friends and Family: Not on file  . Frequency of Social Gatherings with Friends and Family: Not on file  . Attends Religious Services: Not on file  . Active Member of Clubs or Organizations: Not on file  . Attends Banker  Meetings: Not on file  . Marital Status: Not on file  Intimate Partner Violence:   . Fear of Current or Ex-Partner: Not on file  . Emotionally Abused: Not on file  . Physically Abused: Not on file  . Sexually Abused: Not on file    Outpatient Medications Prior to Visit  Medication Sig Dispense Refill  . Norethindrone Acetate-Ethinyl Estrad-FE (BLISOVI 24 FE) 1-20 MG-MCG(24) tablet Take 1 tablet by mouth daily. 1 Package   . buPROPion (WELLBUTRIN XL) 150 MG 24 hr tablet Take 1 tablet (150 mg total) by mouth 2 (two) times daily. 180 tablet 0   No facility-administered medications prior to visit.    Allergies  Allergen Reactions  . Amoxicillin Nausea And Vomiting  . Lactose Intolerance (Gi)     Review of Systems  Constitutional: Negative for fever and malaise/fatigue.  HENT: Negative for congestion.   Eyes: Negative for blurred vision.  Respiratory: Negative for shortness of breath.   Cardiovascular: Negative for chest pain, palpitations and leg swelling.  Gastrointestinal: Negative for abdominal pain, blood in stool and nausea.  Genitourinary: Negative for dysuria and frequency.  Musculoskeletal: Negative for falls.  Skin: Negative for rash.  Neurological: Negative for dizziness, loss of consciousness and headaches.  Endo/Heme/Allergies: Negative for environmental allergies.  Psychiatric/Behavioral: Positive for depression. Negative for hallucinations, substance abuse and suicidal ideas. The patient is nervous/anxious.        Objective:    Physical Exam Constitutional:      Appearance: Normal appearance. She is not ill-appearing.  HENT:     Head: Normocephalic and atraumatic.     Nose: Nose normal.  Eyes:     General:        Right eye: No discharge.        Left eye: No discharge.  Pulmonary:     Effort: Pulmonary effort is normal.  Neurological:     Mental Status: She is alert and oriented to person, place, and time.  Psychiatric:        Behavior: Behavior  normal.     There were no vitals taken for this visit. Wt Readings from Last 3 Encounters:  08/07/19 145 lb 3.2 oz (65.9 kg)  08/02/19 142 lb 6.4 oz (64.6 kg)  11/23/18 144 lb 6.4 oz (65.5 kg)    Diabetic Foot Exam - Simple   No data filed     Lab Results  Component Value Date   WBC 7.0 08/02/2019   HGB 13.4 08/02/2019   HCT 39.1 08/02/2019   PLT 363.0 08/02/2019   GLUCOSE 91 08/02/2019   CHOL 233 (H) 08/02/2019   TRIG 99.0 08/02/2019   HDL 58.50 08/02/2019   LDLDIRECT 152.7 02/03/2012  LDLCALC 155 (H) 08/02/2019   ALT 13 08/02/2019   AST 19 08/02/2019   NA 136 08/02/2019   K 4.4 08/02/2019   CL 103 08/02/2019   CREATININE 0.75 08/02/2019   BUN 13 08/02/2019   CO2 26 08/02/2019   TSH 0.98 08/02/2019    Lab Results  Component Value Date   TSH 0.98 08/02/2019   Lab Results  Component Value Date   WBC 7.0 08/02/2019   HGB 13.4 08/02/2019   HCT 39.1 08/02/2019   MCV 88.9 08/02/2019   PLT 363.0 08/02/2019   Lab Results  Component Value Date   NA 136 08/02/2019   K 4.4 08/02/2019   CO2 26 08/02/2019   GLUCOSE 91 08/02/2019   BUN 13 08/02/2019   CREATININE 0.75 08/02/2019   BILITOT 0.6 08/02/2019   ALKPHOS 45 08/02/2019   AST 19 08/02/2019   ALT 13 08/02/2019   PROT 6.5 08/02/2019   ALBUMIN 4.3 08/02/2019   CALCIUM 9.0 08/02/2019   GFR 85.94 08/02/2019   Lab Results  Component Value Date   CHOL 233 (H) 08/02/2019   Lab Results  Component Value Date   HDL 58.50 08/02/2019   Lab Results  Component Value Date   LDLCALC 155 (H) 08/02/2019   Lab Results  Component Value Date   TRIG 99.0 08/02/2019   Lab Results  Component Value Date   CHOLHDL 4 08/02/2019   No results found for: HGBA1C     Assessment & Plan:   Problem List Items Addressed This Visit    Anxiety    Had stopped the Wellbutrin but has had increased stressors working up her son's diagnosis of ADD and her mother having breast cancer. She is willing to restart Wellbutrin at  150 mg daily and may increase to 300 mg daily as needed. Referred to behavioral health for counseling as well. Spent 20 minutes discussing case, counseling patient and developing plan of care.       Relevant Medications   buPROPion (WELLBUTRIN XL) 150 MG 24 hr tablet    Other Visit Diagnoses    Concentration deficit    -  Primary   Relevant Orders   Ambulatory referral to Behavioral Health      I have changed Fotini M. Dolder's buPROPion. I am also having her maintain her Blisovi 24 Fe.  Meds ordered this encounter  Medications  . buPROPion (WELLBUTRIN XL) 150 MG 24 hr tablet    Sig: Take 1 tablet (150 mg total) by mouth daily.    Dispense:  90 tablet    Refill:  1   I discussed the assessment and treatment plan with the patient. The patient was provided an opportunity to ask questions and all were answered. The patient agreed with the plan and demonstrated an understanding of the instructions.   The patient was advised to call back or seek an in-person evaluation if the symptoms worsen or if the condition fails to improve as anticipated.  I provided 20 minutes of non-face-to-face time during this encounter.   Danise Edge, MD

## 2019-11-27 ENCOUNTER — Telehealth: Payer: No Typology Code available for payment source | Admitting: Family Medicine

## 2019-11-27 MED FILL — BLISOVI 24 FE 1-20 MG-MCG(2: 1-20 | 84 days supply | Qty: 84 | Fill #3

## 2020-01-10 ENCOUNTER — Encounter: Payer: Self-pay | Admitting: Family Medicine

## 2020-01-24 ENCOUNTER — Other Ambulatory Visit (HOSPITAL_COMMUNITY): Payer: Self-pay | Admitting: Obstetrics and Gynecology

## 2020-01-24 LAB — HM MAMMOGRAPHY

## 2020-01-24 MED FILL — buPROPion HCL ER (XL) 150 M: 150 | 90 days supply | Qty: 90 | Fill #0

## 2020-01-25 LAB — HM PAP SMEAR: HM Pap smear: NORMAL

## 2020-01-25 LAB — RESULTS CONSOLE HPV: CHL HPV: NEGATIVE

## 2020-02-19 MED FILL — BLISOVI 24 FE 1-20 MG-MCG(2: 1-20 | 84 days supply | Qty: 84 | Fill #0

## 2020-03-31 ENCOUNTER — Encounter: Payer: Self-pay | Admitting: Family Medicine

## 2020-04-28 ENCOUNTER — Encounter: Payer: Self-pay | Admitting: Family Medicine

## 2020-04-29 ENCOUNTER — Other Ambulatory Visit: Payer: Self-pay | Admitting: Family Medicine

## 2020-04-29 DIAGNOSIS — R0683 Snoring: Secondary | ICD-10-CM

## 2020-04-29 DIAGNOSIS — G4710 Hypersomnia, unspecified: Secondary | ICD-10-CM

## 2020-05-07 MED FILL — BLISOVI 24 FE 1-20 MG-MCG(2: 1-20 | 84 days supply | Qty: 84 | Fill #1

## 2020-05-11 ENCOUNTER — Encounter: Payer: Self-pay | Admitting: Family Medicine

## 2020-05-15 ENCOUNTER — Encounter: Payer: Self-pay | Admitting: Family Medicine

## 2020-05-15 ENCOUNTER — Other Ambulatory Visit: Payer: Self-pay | Admitting: Family Medicine

## 2020-05-15 DIAGNOSIS — M25579 Pain in unspecified ankle and joints of unspecified foot: Secondary | ICD-10-CM

## 2020-05-15 NOTE — Telephone Encounter (Signed)
We have not got the report but they have been contacted. We are waiting for a response.

## 2020-05-15 NOTE — Telephone Encounter (Signed)
Patient would like to speak to someone in regards to her ADHD testing

## 2020-05-15 NOTE — Telephone Encounter (Signed)
Pt is scheduled for tomorrow at 9 am

## 2020-05-16 ENCOUNTER — Other Ambulatory Visit: Payer: Self-pay | Admitting: Family Medicine

## 2020-05-16 ENCOUNTER — Telehealth (INDEPENDENT_AMBULATORY_CARE_PROVIDER_SITE_OTHER): Payer: No Typology Code available for payment source | Admitting: Family Medicine

## 2020-05-16 ENCOUNTER — Other Ambulatory Visit: Payer: Self-pay

## 2020-05-16 DIAGNOSIS — F419 Anxiety disorder, unspecified: Secondary | ICD-10-CM | POA: Diagnosis not present

## 2020-05-16 DIAGNOSIS — F32A Depression, unspecified: Secondary | ICD-10-CM

## 2020-05-16 MED ORDER — BUPROPION HCL ER (XL) 150 MG PO TB24: 300.0000 mg | ORAL_TABLET | Freq: Every day | ORAL | 1 refills | Status: DC

## 2020-05-16 MED FILL — buPROPion HCL ER (XL) 150 M: 150 | 90 days supply | Qty: 180 | Fill #0

## 2020-05-18 NOTE — Assessment & Plan Note (Addendum)
Patient continues to work full time while she parents her two children, and juggles running the home and her marriage. She feels overwhelmed regularly. She feels she has trouble with concentration and is having increasing trouble completing tasks at home and at work. As a result she went for ADD testing which is complete and unavailable at the time of the visit. Patient reports her testing was not diagnostic. Her husband reported more ADD related symptoms than the patient herself. This made it impossible to pin the diagnosis. Depression and anxiety were noted in the testing and the patient agrees. We will increase the Wellbutrin XL to 300 mg daily and she is referred to psychiatry and behavioral health for counseling and medication consideration. After reviewing her evaluation will decide if further medications are also warranted. Spent 30 minutes discussing case and plan with patient.

## 2020-05-18 NOTE — Progress Notes (Signed)
Virtual Visit via Video Note  I connected with Stacy Dennis on 05/16/20 at  9:00 AM EST by a video enabled telemedicine application and verified that I am speaking with the correct person using two identifiers.  Location: Patient: home, patient and provider in visit Provider: home   I discussed the limitations of evaluation and management by telemedicine and the availability of in person appointments. The patient expressed understanding and agreed to proceed.    Subjective:    Patient ID: Stacy Dennis, female    DOB: 01-18-1981, 40 y.o.   MRN: [id number]  No chief complaint on file.   HPI Patient is in today for evaluation of worsening depression, anxiety and possibility of ADD. Patient continues to work full time while she parents her two children, and juggles running the home and her marriage. She feels overwhelmed regularly. She feels she has trouble with concentration and is having increasing trouble completing tasks at home and at work. As a result she went for ADD testing which is complete and unavailable at the time of the visit. Patient reports her testing was not diagnostic. Her husband reported more ADD related symptoms than the patient herself. This made it impossible to pin the diagnosis. Depression and anxiety were noted in the testing and the patient agrees. She reports she cannot stop her mind from racing and has trouble relaxing. She does not have enough time in the day to complete all of her work and take care of herself. Denies CP/palp/SOB/HA/congestion/fevers/GI or GU c/o. Taking meds as prescribed  Past Medical History:  Diagnosis Date  . Abnormal menses 06/17/2015  . Allergic state 01/28/2012  . Anxiety 07/06/2016  . Dermatitis 06/17/2015  . Endometriosis   . H/O varicella   . Hypercholesteremia    no meds  . Hyperlipidemia 01/28/2012  . Pregnancy related carpal tunnel syndrome, antepartum 2013  . Preventative health care 01/28/2012  . Verrucous skin lesion  01/28/2012    Past Surgical History:  Procedure Laterality Date  . CESAREAN SECTION  10-10-11  . CESAREAN SECTION N/A 12/10/2013   Procedure: CESAREAN SECTION;  Surgeon: Zelphia Cairo, MD;  Location: [facility];  Service: Obstetrics;  Laterality: N/A;  Repeat edc 9/27  . EYE SURGERY  2012   lasik  . MYRINGOTOMY      Family History  Problem Relation Age of Onset  . Anxiety disorder Mother   . Hyperlipidemia Mother   . Hypertension Mother   . Irritable bowel syndrome Mother        ?  . Kidney disease Father        stones  . Hyperlipidemia Father   . Cancer Paternal Grandfather        lung- smoker  . Stroke Paternal Grandfather   . Aneurysm Paternal Grandfather   . Hyperlipidemia Paternal Grandmother   . Hypertension Maternal Grandmother   . Heart disease Maternal Grandfather   . Thyroid disease Maternal Aunt   . Crohn's disease Paternal Aunt     Social History   Socioeconomic History  . Marital status: Married    Spouse name: Not on file  . Number of children: Not on file  . Years of education: Not on file  . Highest education level: Not on file  Occupational History  . Not on file  Tobacco Use  . Smoking status: Never Smoker  . Smokeless tobacco: Never Used  Substance and Sexual Activity  . Alcohol use: Yes    Comment: socially when not pregnant  .  Drug use: No  . Sexual activity: Yes    Partners: Male    Birth control/protection: None  Other Topics Concern  . Not on file  Social History Narrative  . Not on file   Social Determinants of Health   Financial Resource Strain: Not on file  Food Insecurity: Not on file  Transportation Needs: Not on file  Physical Activity: Not on file  Stress: Not on file  Social Connections: Not on file  Intimate Partner Violence: Not on file    Outpatient Medications Prior to Visit  Medication Sig Dispense Refill  . Norethindrone Acetate-Ethinyl Estrad-FE (BLISOVI 24 FE) 1-20 MG-MCG(24) tablet Take 1 tablet by mouth  daily. 1 Package   . buPROPion (WELLBUTRIN XL) 150 MG 24 hr tablet Take 1 tablet (150 mg total) by mouth daily. 90 tablet 1   No facility-administered medications prior to visit.    Allergies  Allergen Reactions  . Amoxicillin Nausea And Vomiting  . Lactose Intolerance (Gi)     Review of Systems  Constitutional: Positive for malaise/fatigue. Negative for fever.  HENT: Negative for congestion.   Eyes: Negative for blurred vision.  Respiratory: Negative for shortness of breath.   Cardiovascular: Negative for chest pain, palpitations and leg swelling.  Gastrointestinal: Negative for abdominal pain, blood in stool and nausea.  Genitourinary: Negative for dysuria and frequency.  Musculoskeletal: Negative for falls.  Skin: Negative for rash.  Neurological: Negative for dizziness, loss of consciousness and headaches.  Endo/Heme/Allergies: Negative for environmental allergies.  Psychiatric/Behavioral: Positive for depression. The patient is nervous/anxious.        Objective:    Physical Exam Constitutional:      Appearance: Normal appearance. She is not ill-appearing.  HENT:     Head: Normocephalic and atraumatic.     Right Ear: External ear normal.     Left Ear: External ear normal.     Nose: Nose normal.  Eyes:     General:        Right eye: No discharge.        Left eye: No discharge.  Pulmonary:     Effort: Pulmonary effort is normal.  Neurological:     Mental Status: She is alert and oriented to person, place, and time.  Psychiatric:        Behavior: Behavior normal.     There were no vitals taken for this visit. Wt Readings from Last 3 Encounters:  08/07/19 145 lb 3.2 oz (65.9 kg)  08/02/19 142 lb 6.4 oz (64.6 kg)  11/23/18 144 lb 6.4 oz (65.5 kg)    Diabetic Foot Exam - Simple   No data filed    Lab Results  Component Value Date   WBC 7.0 08/02/2019   HGB 13.4 08/02/2019   HCT 39.1 08/02/2019   PLT 363.0 08/02/2019   GLUCOSE 91 08/02/2019   CHOL 233  (H) 08/02/2019   TRIG 99.0 08/02/2019   HDL 58.50 08/02/2019   LDLDIRECT 152.7 02/03/2012   LDLCALC 155 (H) 08/02/2019   ALT 13 08/02/2019   AST 19 08/02/2019   NA 136 08/02/2019   K 4.4 08/02/2019   CL 103 08/02/2019   CREATININE 0.75 08/02/2019   BUN 13 08/02/2019   CO2 26 08/02/2019   TSH 0.98 08/02/2019    Lab Results  Component Value Date   TSH 0.98 08/02/2019   Lab Results  Component Value Date   WBC 7.0 08/02/2019   HGB 13.4 08/02/2019   HCT 39.1 08/02/2019   MCV  88.9 08/02/2019   PLT 363.0 08/02/2019   Lab Results  Component Value Date   NA 136 08/02/2019   K 4.4 08/02/2019   CO2 26 08/02/2019   GLUCOSE 91 08/02/2019   BUN 13 08/02/2019   CREATININE 0.75 08/02/2019   BILITOT 0.6 08/02/2019   ALKPHOS 45 08/02/2019   AST 19 08/02/2019   ALT 13 08/02/2019   PROT 6.5 08/02/2019   ALBUMIN 4.3 08/02/2019   CALCIUM 9.0 08/02/2019   GFR 85.94 08/02/2019   Lab Results  Component Value Date   CHOL 233 (H) 08/02/2019   Lab Results  Component Value Date   HDL 58.50 08/02/2019   Lab Results  Component Value Date   LDLCALC 155 (H) 08/02/2019   Lab Results  Component Value Date   TRIG 99.0 08/02/2019   Lab Results  Component Value Date   CHOLHDL 4 08/02/2019   No results found for: HGBA1C     Assessment & Plan:   Problem List Items Addressed This Visit    Anxiety - Primary    Patient continues to work full time while she parents her two children, and juggles running the home and her marriage. She feels overwhelmed regularly. She feels she has trouble with concentration and is having increasing trouble completing tasks at home and at work. As a result she went for ADD testing which is complete and unavailable at the time of the visit. Patient reports her testing was not diagnostic. Her husband reported more ADD related symptoms than the patient herself. This made it impossible to pin the diagnosis. Depression and anxiety were noted in the testing and  the patient agrees. We will increase the Wellbutrin XL to 300 mg daily and she is referred to psychiatry and behavioral health for counseling and medication consideration. After reviewing her evaluation will decide if further medications are also warranted. Spent 30 minutes discussing case and plan with patient.       Relevant Medications   buPROPion (WELLBUTRIN XL) 150 MG 24 hr tablet   Other Relevant Orders   Ambulatory referral to Behavioral Health   Ambulatory referral to Psychiatry    Other Visit Diagnoses    Depression, unspecified depression type       Relevant Medications   buPROPion (WELLBUTRIN XL) 150 MG 24 hr tablet   Other Relevant Orders   Ambulatory referral to Behavioral Health   Ambulatory referral to Psychiatry      I have changed Stacy Dennis's buPROPion. I am also having her maintain her Blisovi 24 Fe.  Meds ordered this encounter  Medications  . buPROPion (WELLBUTRIN XL) 150 MG 24 hr tablet    Sig: Take 2 tablets (300 mg total) by mouth daily.    Dispense:  180 tablet    Refill:  1     I discussed the assessment and treatment plan with the patient. The patient was provided an opportunity to ask questions and all were answered. The patient agreed with the plan and demonstrated an understanding of the instructions.   The patient was advised to call back or seek an in-person evaluation if the symptoms worsen or if the condition fails to improve as anticipated.  I provided 30 minutes of non-face-to-face time during this encounter.   Danise Edge, MD

## 2020-05-28 ENCOUNTER — Institutional Professional Consult (permissible substitution): Payer: No Typology Code available for payment source | Admitting: Pulmonary Disease

## 2020-05-29 ENCOUNTER — Other Ambulatory Visit: Payer: Self-pay

## 2020-05-29 ENCOUNTER — Encounter: Payer: Self-pay | Admitting: Family Medicine

## 2020-05-29 ENCOUNTER — Encounter: Payer: Self-pay | Admitting: Pulmonary Disease

## 2020-05-29 ENCOUNTER — Ambulatory Visit (INDEPENDENT_AMBULATORY_CARE_PROVIDER_SITE_OTHER): Payer: No Typology Code available for payment source | Admitting: Pulmonary Disease

## 2020-05-29 VITALS — BP 124/82 | HR 87 | Temp 98.0°F | Ht 59.0 in | Wt 147.0 lb

## 2020-05-29 DIAGNOSIS — G4719 Other hypersomnia: Secondary | ICD-10-CM | POA: Diagnosis not present

## 2020-05-29 NOTE — Patient Instructions (Signed)
Moderate probability of significant obstructive sleep apnea  We will schedule you for home sleep study Treatment options were discussed  Tentative follow-up in about 3 months  Call with significant concerns Sleep Apnea Sleep apnea affects breathing during sleep. It causes breathing to stop for a short time or to become shallow. It can also increase the risk of:  Heart attack.  Stroke.  Being very overweight (obese).  Diabetes.  Heart failure.  Irregular heartbeat. The goal of treatment is to help you breathe normally again. What are the causes? There are three kinds of sleep apnea:  Obstructive sleep apnea. This is caused by a blocked or collapsed airway.  Central sleep apnea. This happens when the brain does not send the right signals to the muscles that control breathing.  Mixed sleep apnea. This is a combination of obstructive and central sleep apnea. The most common cause of this condition is a collapsed or blocked airway. This can happen if:  Your throat muscles are too relaxed.  Your tongue and tonsils are too large.  You are overweight.  Your airway is too small.   What increases the risk?  Being overweight.  Smoking.  Having a small airway.  Being older.  Being female.  Drinking alcohol.  Taking medicines to calm yourself (sedatives or tranquilizers).  Having family members with the condition. What are the signs or symptoms?  Trouble staying asleep.  Being sleepy or tired during the day.  Getting angry a lot.  Loud snoring.  Headaches in the morning.  Not being able to focus your mind (concentrate).  Forgetting things.  Less interest in sex.  Mood swings.  Personality changes.  Feelings of sadness (depression).  Waking up a lot during the night to pee (urinate).  Dry mouth.  Sore throat. How is this diagnosed?  Your medical history.  A physical exam.  A test that is done when you are sleeping (sleep study). The test is  most often done in a sleep lab but may also be done at home. How is this treated?  Sleeping on your side.  Using a medicine to get rid of mucus in your nose (decongestant).  Avoiding the use of alcohol, medicines to help you relax, or certain pain medicines (narcotics).  Losing weight, if needed.  Changing your diet.  Not smoking.  Using a machine to open your airway while you sleep, such as: ? An oral appliance. This is a mouthpiece that shifts your lower jaw forward. ? A CPAP device. This device blows air through a mask when you breathe out (exhale). ? An EPAP device. This has valves that you put in each nostril. ? A BPAP device. This device blows air through a mask when you breathe in (inhale) and breathe out.  Having surgery if other treatments do not work. It is important to get treatment for sleep apnea. Without treatment, it can lead to:  High blood pressure.  Coronary artery disease.  In men, not being able to have an erection (impotence).  Reduced thinking ability.   Follow these instructions at home: Lifestyle  Make changes that your doctor recommends.  Eat a healthy diet.  Lose weight if needed.  Avoid alcohol, medicines to help you relax, and some pain medicines.  Do not use any products that contain nicotine or tobacco, such as cigarettes, e-cigarettes, and chewing tobacco. If you need help quitting, ask your doctor. General instructions  Take over-the-counter and prescription medicines only as told by your doctor.  If  you were given a machine to use while you sleep, use it only as told by your doctor.  If you are having surgery, make sure to tell your doctor you have sleep apnea. You may need to bring your device with you.  Keep all follow-up visits as told by your doctor. This is important. Contact a doctor if:  The machine that you were given to use during sleep bothers you or does not seem to be working.  You do not get better.  You get  worse. Get help right away if:  Your chest hurts.  You have trouble breathing in enough air.  You have an uncomfortable feeling in your back, arms, or stomach.  You have trouble talking.  One side of your body feels weak.  A part of your face is hanging down. These symptoms may be an emergency. Do not wait to see if the symptoms will go away. Get medical help right away. Call your local emergency services (911 in the [place] Do not drive yourself to the hospital. Summary  This condition affects breathing during sleep.  The most common cause is a collapsed or blocked airway.  The goal of treatment is to help you breathe normally while you sleep. This information is not intended to replace advice given to you by your health care provider. Make sure you discuss any questions you have with your health care provider. Document Revised: 12/23/2017 Document Reviewed: 11/01/2017 Elsevier Patient Education  2021 [employer].

## 2020-05-29 NOTE — Progress Notes (Signed)
Stacy Dennis    [id number]    Jul 28, 1980  Primary Care Physician:Blyth, Bryon Lions, MD  Referring Physician: Bradd Canary, MD [address] [place],  [place] [address]  Chief complaint:   Patient with nonrestorative sleep Excessive daytime sleepiness  HPI:  Excessive daytime sleepiness Despite trying to increase number of hours of sleep-still wakes up feeling tired  Has no difficulty falling asleep, no difficulty maintaining sleep  Usually goes to bed between 10 and 11 PM Falls asleep easily Usually no awakenings Usual wake up time between 5 and 6 AM  Weight has been stable may be up 5 pounds at most  She remembers being told multiple times about the size of her tongue  She was recently on vacation with a friend who told her that she was snoring with breath-holding  Admits to snorting respirations, no gasping No choking episodes No headaches in the morning No dryness of her mouth in the mornings No night sweats  No significant family history of health problems  Non-smoker  Physical therapist    Outpatient Encounter Medications as of 05/29/2020  Medication Sig  . buPROPion (WELLBUTRIN XL) 150 MG 24 hr tablet Take 2 tablets (300 mg total) by mouth daily.  . Norethindrone Acetate-Ethinyl Estrad-FE (BLISOVI 24 FE) 1-20 MG-MCG(24) tablet Take 1 tablet by mouth daily.   No facility-administered encounter medications on file as of 05/29/2020.    Allergies as of 05/29/2020 - Review Complete 05/29/2020  Allergen Reaction Noted  . Amoxicillin Nausea And Vomiting 10/07/2011  . Lactose intolerance (gi)  10/07/2011    Past Medical History:  Diagnosis Date  . Abnormal menses 06/17/2015  . Allergic state 01/28/2012  . Anxiety 07/06/2016  . Dermatitis 06/17/2015  . Endometriosis   . H/O varicella   . Hypercholesteremia    no meds  . Hyperlipidemia 01/28/2012  . Pregnancy related carpal tunnel syndrome, antepartum 2013  . Preventative  health care 01/28/2012  . Verrucous skin lesion 01/28/2012    Past Surgical History:  Procedure Laterality Date  . CESAREAN SECTION  10-10-11  . CESAREAN SECTION N/A 12/10/2013   Procedure: CESAREAN SECTION;  Surgeon: Zelphia Cairo, MD;  Location: [facility];  Service: Obstetrics;  Laterality: N/A;  Repeat edc 9/27  . EYE SURGERY  2012   lasik  . MYRINGOTOMY      Family History  Problem Relation Age of Onset  . Anxiety disorder Mother   . Hyperlipidemia Mother   . Hypertension Mother   . Irritable bowel syndrome Mother        ?  . Kidney disease Father        stones  . Hyperlipidemia Father   . Cancer Paternal Grandfather        lung- smoker  . Stroke Paternal Grandfather   . Aneurysm Paternal Grandfather   . Hyperlipidemia Paternal Grandmother   . Hypertension Maternal Grandmother   . Heart disease Maternal Grandfather   . Thyroid disease Maternal Aunt   . Crohn's disease Paternal Aunt     Social History   Socioeconomic History  . Marital status: Married    Spouse name: Not on file  . Number of children: Not on file  . Years of education: Not on file  . Highest education level: Not on file  Occupational History  . Not on file  Tobacco Use  . Smoking status: Never Smoker  . Smokeless tobacco: Never Used  Substance and Sexual  Activity  . Alcohol use: Yes    Comment: socially when not pregnant  . Drug use: No  . Sexual activity: Yes    Partners: Male    Birth control/protection: None  Other Topics Concern  . Not on file  Social History Narrative  . Not on file   Social Determinants of Health   Financial Resource Strain: Not on file  Food Insecurity: Not on file  Transportation Needs: Not on file  Physical Activity: Not on file  Stress: Not on file  Social Connections: Not on file  Intimate Partner Violence: Not on file    Review of Systems  Constitutional: Positive for fatigue.  Respiratory: Negative for cough, choking and shortness of breath.      Vitals:   05/29/20 1138  BP: 124/82  Pulse: 87  Temp: 98 F (36.7 C)  SpO2: 98%     Physical Exam Constitutional:      Appearance: Normal appearance.  HENT:     Head: Normocephalic.     Mouth/Throat:     Mouth: Mucous membranes are moist.     Comments: Macroglossia, Mallampati 4 Eyes:     General:        Right eye: No discharge.     Pupils: Pupils are equal, round, and reactive to light.  Cardiovascular:     Rate and Rhythm: Normal rate and regular rhythm.     Pulses: Normal pulses.     Heart sounds: No murmur heard.   Pulmonary:     Effort: No respiratory distress.     Breath sounds: No stridor. No wheezing or rhonchi.  Musculoskeletal:     Cervical back: No rigidity or tenderness.  Neurological:     Mental Status: She is alert.  Psychiatric:        Mood and Affect: Mood normal.    Results of the Epworth flowsheet 05/29/2020  Sitting and reading 3  Watching TV 3  Sitting, inactive in a public place (e.g. a theatre or a meeting) 2  As a passenger in a car for an hour without a break 1  Lying down to rest in the afternoon when circumstances permit 1  Sitting and talking to someone 0  Sitting quietly after a lunch without alcohol 1  In a car, while stopped for a few minutes in traffic 0  Total score 11    Assessment:  Excessive daytime sleepiness  Moderate probability of significant obstructive sleep apnea  Pathophysiology of sleep disordered breathing discussed with the patient Treatment options for sleep disordered breathing discussed with the patient   Plan/Recommendations: Schedule the patient for home sleep study  We will update our results as soon as reviewed  Tentative follow-up in 3 to 4 months  Encouraged to call with any significant concerns   Virl Diamond MD [facility] 05/29/2020, 11:50 AM  CC: Bradd Canary, MD

## 2020-05-29 NOTE — Addendum Note (Signed)
Addended by: Dulcy Fanny on: 05/29/2020 12:17 PM   Modules accepted: Orders

## 2020-06-05 ENCOUNTER — Ambulatory Visit: Payer: No Typology Code available for payment source | Admitting: Psychology

## 2020-06-18 ENCOUNTER — Ambulatory Visit: Payer: No Typology Code available for payment source

## 2020-06-18 ENCOUNTER — Other Ambulatory Visit: Payer: Self-pay

## 2020-06-18 DIAGNOSIS — G4719 Other hypersomnia: Secondary | ICD-10-CM

## 2020-06-18 DIAGNOSIS — G4733 Obstructive sleep apnea (adult) (pediatric): Secondary | ICD-10-CM

## 2020-06-19 ENCOUNTER — Ambulatory Visit: Payer: No Typology Code available for payment source | Admitting: Sports Medicine

## 2020-06-24 ENCOUNTER — Ambulatory Visit: Payer: No Typology Code available for payment source | Admitting: Psychology

## 2020-06-30 DIAGNOSIS — G4733 Obstructive sleep apnea (adult) (pediatric): Secondary | ICD-10-CM | POA: Diagnosis not present

## 2020-07-01 ENCOUNTER — Telehealth: Payer: Self-pay | Admitting: Pulmonary Disease

## 2020-07-01 NOTE — Telephone Encounter (Signed)
Call patient  Sleep study result  Date of study: 06/18/2020  Impression: Mild obstructive sleep apnea Mild oxygen desaturations  Recommendation:  Options of treatment for mild obstructive sleep apnea will include CPAP therapy If CPAP is chosen as option of treatment, auto titrating CPAP 5-15 will be appropriate  An oral device may also be considered as an option of treatment for mild sleep disordered breathing  Sleep position optimization by encouraging sleep in a lateral position, elevating the head of the bed by 30 degrees may help  Encourage weight loss efforts  Follow-up as previously scheduled

## 2020-07-01 NOTE — Telephone Encounter (Signed)
Spoke with the pt and notified of results  She verbalized understanding  She states that she is going to have to think about these options and all back  She will let us know what she wants to do, will await her call  Forwarding to Amy to f/u on, thanks!

## 2020-07-08 NOTE — Telephone Encounter (Signed)
I tried to call patient to find out decision on HST results. There was no answer, left VM for call back.

## 2020-07-10 NOTE — Telephone Encounter (Signed)
I called and spoke with patient regarding HST results and Dr. Agustina Caroli, she had a lot of questions regarding HST and treatment. She is unsure what she wants to do and worried about cost. She feels since it is mild, she like to take her time in choosing. She is going to check with her insurance to see the cost with CPAP vs oral appliance and think some more and will reach back out to [place]. Will route to Dr. Val Eagle as FYI  Dr. Val Eagle, just an FYI and if you like to advise to anymore recs?. Thanks!

## 2020-07-14 NOTE — Telephone Encounter (Signed)
She may come in to see me or one of the APP's for further discussions

## 2020-07-15 NOTE — Telephone Encounter (Signed)
I called and spoke with patient with Dr. Beverley Fiedler. She stated she will call and make an appt later today as she is at work right now. Nothing further needed.

## 2020-07-24 ENCOUNTER — Ambulatory Visit: Payer: No Typology Code available for payment source | Admitting: Sports Medicine

## 2020-07-24 ENCOUNTER — Other Ambulatory Visit: Payer: Self-pay

## 2020-07-24 VITALS — BP 122/80 | Ht 59.0 in | Wt 147.0 lb

## 2020-07-24 DIAGNOSIS — M79673 Pain in unspecified foot: Secondary | ICD-10-CM | POA: Diagnosis not present

## 2020-07-24 NOTE — Progress Notes (Signed)
Office Visit Note   Patient: Stacy Dennis           Date of Birth: [birth year]           MRN: [id number] Visit Date: 07/24/2020 Requested by: Bradd Canary, MD [address] [place],  [place] [address] PCP: Bradd Canary, MD  Subjective: CC: Foot Pain  HPI: 40 year old female presenting to clinic today for bilateral arch pain with workout activities.  Patient has a history of significant pes planus, that was previously causing arch pain, however has been well controlled with custom orthotics for the past 6 years.  Recently, patient joined a gym that has had her doing many more exercises on the balls of her feet.  She states that this has started to cause significant pain throughout the bottoms of her feet specifically with these exercises (mountain climber's, plank jacks, etc.).  She says that there are times where her foot pain is so significant she has to stop the activity early, though she feels as though the rest of her body would be able to keep going with the exercises.  Her feet do not bother her with running or walking activities.  She denies ever being barefoot, stating that she wears house slippers in the home.  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  She is otherwise doing well.              ROS:   All other systems were reviewed and are negative.  Objective: Vital Signs: BP 122/80   Ht 4\' 11"  (1.499 m)   Wt 147 lb (66.7 kg)   BMI 29.69 kg/m  No flowsheet data found.   No flowsheet data found.  Physical Exam:  General:  Alert and oriented, in no acute distress. Pulm:  Breathing unlabored. Psy:  Normal mood, congruent affect. Skin: Bilateral feet without bruises, rashes, or erythema. Overlying skin intact.  Bilateral feet with significant pes planus.  Normal calcaneal inversion and posterior tibialis function with heel rise. No tenderness along the longitudinal  arch bilaterally. Minor discomfort with metatarsal squeeze bilaterally.  There is no significant pain or tenderness with palpation of the metatarsal heads.  No tenderness throughout midfoot, fifth metatarsal, or peroneal tendons. Ankle and toes with full range of motion. Brisk capillary refill and sensation intact throughout bilateral feet.  Imaging: None today.  Assessment & Plan: 40 year old female with past medical history significant for pes planus bilaterally, who is presenting to clinic with concerns of new arch pain during exercises requiring her to remain on the balls of her feet.  Examination of her orthotics reveals that they are still supportive, with no significant breakdown despite their age. -Suspect the overall source of her symptoms is due to fatigue of her intrinsic foot muscles causing cramping.  Given that she does not have foot pain with weightbearing or walking/running, do not feel that she would benefit from new orthotics at this time. -We will start conservative care with intrinsic foot muscle strengthening exercises to help improve the endurance of her feet.  Optimistic that with improved foot strength and endurance she should be able to continue with her exercise routine. -If no improvement in 4 weeks of home exercises, return to clinic.  At that time, would consider new orthotics. -Patient was agreeable with plan.  She had no further questions or concerns today.  Procedures: No procedures performed     I observed and examined the patient with the SM resident and agree with assessment and plan.  Note reviewed and modified by me.  Sterling Big, MD

## 2020-08-04 MED FILL — Norethindrone Ace-Ethinyl Estradiol-FE Tab 1 MG-20 MCG (24): ORAL | 84 days supply | Qty: 84 | Fill #0 | Status: AC

## 2020-08-05 ENCOUNTER — Telehealth: Payer: Self-pay | Admitting: Family Medicine

## 2020-08-05 ENCOUNTER — Other Ambulatory Visit (HOSPITAL_COMMUNITY): Payer: Self-pay

## 2020-08-05 NOTE — Telephone Encounter (Signed)
Patient called and appt changed to virtual

## 2020-08-05 NOTE — Telephone Encounter (Signed)
Yes Virtual CPE is OK in this instance

## 2020-08-05 NOTE — Telephone Encounter (Signed)
Are you ok with her cpe being virtual. She has cone ins.

## 2020-08-05 NOTE — Telephone Encounter (Signed)
Patient has a physical appt on 08/07/20 with Dr. Abner Greenspan. Patient child has covid, patient would like to know if she could be seeing virtually. (insurance will covered)

## 2020-08-07 ENCOUNTER — Other Ambulatory Visit (HOSPITAL_COMMUNITY): Payer: Self-pay

## 2020-08-07 ENCOUNTER — Telehealth (INDEPENDENT_AMBULATORY_CARE_PROVIDER_SITE_OTHER): Payer: No Typology Code available for payment source | Admitting: Family Medicine

## 2020-08-07 ENCOUNTER — Other Ambulatory Visit: Payer: Self-pay

## 2020-08-07 ENCOUNTER — Encounter: Payer: Self-pay | Admitting: Family Medicine

## 2020-08-07 DIAGNOSIS — Z0000 Encounter for general adult medical examination without abnormal findings: Secondary | ICD-10-CM

## 2020-08-07 DIAGNOSIS — F419 Anxiety disorder, unspecified: Secondary | ICD-10-CM

## 2020-08-07 DIAGNOSIS — E663 Overweight: Secondary | ICD-10-CM

## 2020-08-07 DIAGNOSIS — E785 Hyperlipidemia, unspecified: Secondary | ICD-10-CM

## 2020-08-07 MED ORDER — METHYLPHENIDATE HCL 10 MG PO TABS
10.0000 mg | ORAL_TABLET | Freq: Two times a day (BID) | ORAL | 0 refills | Status: DC
  Filled 2020-08-07: qty 20, 10d supply, fill #0

## 2020-08-07 NOTE — Progress Notes (Signed)
MyChart Video Visit    Virtual Visit via Video Note   This visit type was conducted due to national recommendations for restrictions regarding the COVID-19 Pandemic (e.g. social distancing) in an effort to limit this patient's exposure and mitigate transmission in our community. This patient is at least at moderate risk for complications without adequate follow up. This format is felt to be most appropriate for this patient at this time. Physical exam was limited by quality of the video and audio technology used for the visit. S chism, CMA was able to get the patient set up on a video visit.  Patient location: home Patient and provider in visit Provider location: Office  I discussed the limitations of evaluation and management by telemedicine and the availability of in person appointments. The patient expressed understanding and agreed to proceed.  Visit Date: 08/07/2020  Today's healthcare provider: Danise EdgeStacey Aleigh Grunden, MD     Subjective:    Patient ID: Stacy Dennis, female    DOB: 10/09/1980, 40 y.o.   MRN: [id number]  Chief Complaint  Patient presents with  . Annual Exam    HPI Patient is in today for annual preventative exam and follow up on chronic medical examination such as anxiety disorder and hyperlipidemia. She did undergo ADD evaluation and they were unable to officially diagnose her with ADD because her husband and she had very different answers. Her anxiety and focus are better at home with the addition of morning exercise but her focus at work is still suffering and she is having trouble concentrating at work especially getting her documentation done.  No recent febrile illness or hospitalizations. She is exercising regularly and trying to eat a much healthier diet. She feels stronger but is having trouble getting the abdominal adiposity to go down. She feels toned everywhere else. She is following with GYN still. Denies CP/palp/SOB/HA/congestion/fevers/GI or GU c/o. Taking  meds as prescribed. . Past Medical History:  Diagnosis Date  . Abnormal menses 06/17/2015  . Allergic state 01/28/2012  . Anxiety 07/06/2016  . Dermatitis 06/17/2015  . Endometriosis   . H/O varicella   . Hypercholesteremia    no meds  . Hyperlipidemia 01/28/2012  . Pregnancy related carpal tunnel syndrome, antepartum 2013  . Preventative health care 01/28/2012  . Verrucous skin lesion 01/28/2012    Past Surgical History:  Procedure Laterality Date  . CESAREAN SECTION  10-10-11  . CESAREAN SECTION N/A 12/10/2013   Procedure: CESAREAN SECTION;  Surgeon: Zelphia CairoGretchen Adkins, MD;  Location: [facility];  Service: Obstetrics;  Laterality: N/A;  Repeat edc 9/27  . EYE SURGERY  2012   lasik  . MYRINGOTOMY      Family History  Problem Relation Age of Onset  . Anxiety disorder Mother   . Hyperlipidemia Mother   . Hypertension Mother   . Irritable bowel syndrome Mother        ?  . Kidney disease Father        stones  . Hyperlipidemia Father   . Cancer Paternal Grandfather        lung- smoker  . Stroke Paternal Grandfather   . Aneurysm Paternal Grandfather   . Hyperlipidemia Paternal Grandmother   . Hypertension Maternal Grandmother   . Heart disease Maternal Grandfather   . Thyroid disease Maternal Aunt   . Crohn's disease Paternal Aunt     Social History   Socioeconomic History  . Marital status: Married    Spouse name: Not on file  . Number of  children: Not on file  . Years of education: Not on file  . Highest education level: Not on file  Occupational History  . Not on file  Tobacco Use  . Smoking status: Never Smoker  . Smokeless tobacco: Never Used  Substance and Sexual Activity  . Alcohol use: Yes    Comment: socially when not pregnant  . Drug use: No  . Sexual activity: Yes    Partners: Male    Birth control/protection: None  Other Topics Concern  . Not on file  Social History Narrative  . Not on file   Social Determinants of Health   Financial Resource  Strain: Not on file  Food Insecurity: Not on file  Transportation Needs: Not on file  Physical Activity: Not on file  Stress: Not on file  Social Connections: Not on file  Intimate Partner Violence: Not on file    Outpatient Medications Prior to Visit  Medication Sig Dispense Refill  . buPROPion (WELLBUTRIN XL) 150 MG 24 hr tablet TAKE 2 TABLETS (300 MG TOTAL) BY MOUTH DAILY. 180 tablet 1  . buPROPion (WELLBUTRIN XL) 150 MG 24 hr tablet TAKE 1 TABLET (150 MG TOTAL) BY MOUTH DAILY. 90 tablet 4  . Norethindrone Acetate-Ethinyl Estrad-FE (BLISOVI 24 FE) 1-20 MG-MCG(24) tablet Take 1 tablet by mouth daily. 1 Package   . Norethindrone Acetate-Ethinyl Estrad-FE (LOESTRIN 24 FE) 1-20 MG-MCG(24) tablet TAKE 1 TABlet BY MOUTH DAILY 84 tablet 4   No facility-administered medications prior to visit.    Allergies  Allergen Reactions  . Amoxicillin Nausea And Vomiting  . Lactose Intolerance (Gi)     Review of Systems  Constitutional: Negative for chills, fever and malaise/fatigue.  HENT: Negative for congestion and hearing loss.   Eyes: Negative for discharge.  Respiratory: Negative for cough, sputum production and shortness of breath.   Cardiovascular: Negative for chest pain, palpitations and leg swelling.  Gastrointestinal: Negative for abdominal pain, blood in stool, constipation, diarrhea, heartburn, nausea and vomiting.  Genitourinary: Negative for dysuria, frequency, hematuria and urgency.  Musculoskeletal: Negative for back pain, falls and myalgias.  Skin: Negative for rash.  Neurological: Negative for dizziness, sensory change, loss of consciousness, weakness and headaches.  Endo/Heme/Allergies: Negative for environmental allergies. Does not bruise/bleed easily.  Psychiatric/Behavioral: Negative for depression and suicidal ideas. The patient is nervous/anxious and has insomnia.        Objective:    Physical Exam Constitutional:      General: She is not in acute distress.     Appearance: She is well-developed.  HENT:     Head: Normocephalic and atraumatic.  Eyes:     Conjunctiva/sclera: Conjunctivae normal.  Neck:     Thyroid: No thyromegaly.  Cardiovascular:     Rate and Rhythm: Normal rate and regular rhythm.     Heart sounds: Normal heart sounds. No murmur heard.   Pulmonary:     Effort: Pulmonary effort is normal. No respiratory distress.     Breath sounds: Normal breath sounds.  Abdominal:     General: Bowel sounds are normal. There is no distension.     Palpations: Abdomen is soft. There is no mass.     Tenderness: There is no abdominal tenderness.  Musculoskeletal:     Cervical back: Neck supple.  Lymphadenopathy:     Cervical: No cervical adenopathy.  Skin:    General: Skin is warm and dry.  Neurological:     Mental Status: She is alert and oriented to person, place, and time.  Psychiatric:        Behavior: Behavior normal.     There were no vitals taken for this visit. Wt Readings from Last 3 Encounters:  07/24/20 147 lb (66.7 kg)  05/29/20 147 lb (66.7 kg)  08/07/19 145 lb 3.2 oz (65.9 kg)    Diabetic Foot Exam - Simple   No data filed    Lab Results  Component Value Date   WBC 7.0 08/02/2019   HGB 13.4 08/02/2019   HCT 39.1 08/02/2019   PLT 363.0 08/02/2019   GLUCOSE 91 08/02/2019   CHOL 233 (H) 08/02/2019   TRIG 99.0 08/02/2019   HDL 58.50 08/02/2019   LDLDIRECT 152.7 02/03/2012   LDLCALC 155 (H) 08/02/2019   ALT 13 08/02/2019   AST 19 08/02/2019   NA 136 08/02/2019   K 4.4 08/02/2019   CL 103 08/02/2019   CREATININE 0.75 08/02/2019   BUN 13 08/02/2019   CO2 26 08/02/2019   TSH 0.98 08/02/2019    Lab Results  Component Value Date   TSH 0.98 08/02/2019   Lab Results  Component Value Date   WBC 7.0 08/02/2019   HGB 13.4 08/02/2019   HCT 39.1 08/02/2019   MCV 88.9 08/02/2019   PLT 363.0 08/02/2019   Lab Results  Component Value Date   NA 136 08/02/2019   K 4.4 08/02/2019   CO2 26 08/02/2019    GLUCOSE 91 08/02/2019   BUN 13 08/02/2019   CREATININE 0.75 08/02/2019   BILITOT 0.6 08/02/2019   ALKPHOS 45 08/02/2019   AST 19 08/02/2019   ALT 13 08/02/2019   PROT 6.5 08/02/2019   ALBUMIN 4.3 08/02/2019   CALCIUM 9.0 08/02/2019   GFR 85.94 08/02/2019   Lab Results  Component Value Date   CHOL 233 (H) 08/02/2019   Lab Results  Component Value Date   HDL 58.50 08/02/2019   Lab Results  Component Value Date   LDLCALC 155 (H) 08/02/2019   Lab Results  Component Value Date   TRIG 99.0 08/02/2019   Lab Results  Component Value Date   CHOLHDL 4 08/02/2019   No results found for: HGBA1C     Assessment & Plan:   Problem List Items Addressed This Visit    Hyperlipidemia    Encouraged heart healthy diet, increase exercise, avoid trans fats, consider a krill oil cap daily      Preventative health care - Primary    Patient encouraged to maintain heart healthy diet, regular exercise, adequate sleep. Consider daily probiotics. Take medications as prescribed. Labs ordered and reviewed      Relevant Orders   Hemoglobin A1c   CBC with Differential/Platelet   Comprehensive metabolic panel   TSH   Lipid panel   Anxiety    She did not tolerate Wellbutrin 2 tabs as it disrupted her sleep. She backed down to 1 tab daily and now she is sleeping better. By adding exercise in the morning and not juggling exercise in the evening she is less stressed and she is feeling better.       Overweight (BMI 25.0-29.9)    Started at a new gym this month and she feels she is making her feel better and take good care of herself.         I am having Charm M. Rinks maintain her Blisovi 24 Fe, buPROPion, Norethindrone Acetate-Ethinyl Estrad-FE, and buPROPion.  No orders of the defined types were placed in this encounter.   I discussed the assessment and treatment  plan with the patient. The patient was provided an opportunity to ask questions and all were answered. The patient agreed  with the plan and demonstrated an understanding of the instructions.   The patient was advised to call back or seek an in-person evaluation if the symptoms worsen or if the condition fails to improve as anticipated.  I provided 35 minutes of face-to-face time during this encounter.   Danise Edge, MD [facility] [phone] (phone) [phone] (fax)  [facility]

## 2020-08-07 NOTE — Assessment & Plan Note (Signed)
Started at a new gym this month and she feels she is making her feel better and take good care of herself.

## 2020-08-07 NOTE — Assessment & Plan Note (Signed)
Encouraged heart healthy diet, increase exercise, avoid trans fats, consider a krill oil cap daily 

## 2020-08-07 NOTE — Assessment & Plan Note (Signed)
She did not tolerate Wellbutrin 2 tabs as it disrupted her sleep. She backed down to 1 tab daily and now she is sleeping better. By adding exercise in the morning and not juggling exercise in the evening she is less stressed and she is feeling better.

## 2020-08-07 NOTE — Patient Instructions (Addendum)
Preventive Care 84-40 Years Old, Female Preventive care refers to lifestyle choices and visits with your health care provider that can promote health and wellness. This includes:  A yearly physical exam. This is also called an annual wellness visit.  Regular dental and eye exams.  Immunizations.  Screening for certain conditions.  Healthy lifestyle choices, such as: ? Eating a healthy diet. ? Getting regular exercise. ? Not using drugs or products that contain nicotine and tobacco. ? Limiting alcohol use. What can I expect for my preventive care visit? Physical exam Your health care provider will check your:  Height and weight. These may be used to calculate your BMI (body mass index). BMI is a measurement that tells if you are at a healthy weight.  Heart rate and blood pressure.  Body temperature.  Skin for abnormal spots. Counseling Your health care provider may ask you questions about your:  Past medical problems.  Family's medical history.  Alcohol, tobacco, and drug use.  Emotional well-being.  Home life and relationship well-being.  Sexual activity.  Diet, exercise, and sleep habits.  Work and work Statistician.  Access to firearms.  Method of birth control.  Menstrual cycle.  Pregnancy history. What immunizations do I need? Vaccines are usually given at various ages, according to a schedule. Your health care provider will recommend vaccines for you based on your age, medical history, and lifestyle or other factors, such as travel or where you work.   What tests do I need? Blood tests  Lipid and cholesterol levels. These may be checked every 5 years, or more often if you are over 3 years old.  Hepatitis C test.  Hepatitis B test. Screening  Lung cancer screening. You may have this screening every year starting at age 73 if you have a 30-pack-year history of smoking and currently smoke or have quit within the past 15 years.  Colorectal cancer  screening. ? All adults should have this screening starting at age 52 and continuing until age 17. ? Your health care provider may recommend screening at age 49 if you are at increased risk. ? You will have tests every 1-10 years, depending on your results and the type of screening test.  Diabetes screening. ? This is done by checking your blood sugar (glucose) after you have not eaten for a while (fasting). ? You may have this done every 1-3 years.  Mammogram. ? This may be done every 1-2 years. ? Talk with your health care provider about when you should start having regular mammograms. This may depend on whether you have a family history of breast cancer.  BRCA-related cancer screening. This may be done if you have a family history of breast, ovarian, tubal, or peritoneal cancers.  Pelvic exam and Pap test. ? This may be done every 3 years starting at age 10. ? Starting at age 11, this may be done every 5 years if you have a Pap test in combination with an HPV test. Other tests  STD (sexually transmitted disease) testing, if you are at risk.  Bone density scan. This is done to screen for osteoporosis. You may have this scan if you are at high risk for osteoporosis. Talk with your health care provider about your test results, treatment options, and if necessary, the need for more tests. Follow these instructions at home: Eating and drinking  Eat a diet that includes fresh fruits and vegetables, whole grains, lean protein, and low-fat dairy products.  Take vitamin and mineral supplements  as recommended by your health care provider.  Do not drink alcohol if: ? Your health care provider tells you not to drink. ? You are pregnant, may be pregnant, or are planning to become pregnant.  If you drink alcohol: ? Limit how much you have to 0-1 drink a day. ? Be aware of how much alcohol is in your drink. In the [place] one drink equals one 12 oz bottle of beer (355 mL), one 5 oz glass of  wine (148 mL), or one 1 oz glass of hard liquor (44 mL).   Lifestyle  Take daily care of your teeth and gums. Brush your teeth every morning and night with fluoride toothpaste. Floss one time each day.  Stay active. Exercise for at least 30 minutes 5 or more days each week.  Do not use any products that contain nicotine or tobacco, such as cigarettes, e-cigarettes, and chewing tobacco. If you need help quitting, ask your health care provider.  Do not use drugs.  If you are sexually active, practice safe sex. Use a condom or other form of protection to prevent STIs (sexually transmitted infections).  If you do not wish to become pregnant, use a form of birth control. If you plan to become pregnant, see your health care provider for a prepregnancy visit.  If told by your health care provider, take low-dose aspirin daily starting at age 50.  Find healthy ways to cope with stress, such as: ? Meditation, yoga, or listening to music. ? Journaling. ? Talking to a trusted person. ? Spending time with friends and family. Safety  Always wear your seat belt while driving or riding in a vehicle.  Do not drive: ? If you have been drinking alcohol. Do not ride with someone who has been drinking. ? When you are tired or distracted. ? While texting.  Wear a helmet and other protective equipment during sports activities.  If you have firearms in your house, make sure you follow all gun safety procedures. What's next?  Visit your health care provider once a year for an annual wellness visit.  Ask your health care provider how often you should have your eyes and teeth checked.  Stay up to date on all vaccines. This information is not intended to replace advice given to you by your health care provider. Make sure you discuss any questions you have with your health care provider. Document Revised: 12/11/2019 Document Reviewed: 11/17/2017 Elsevier Patient Education  2021 Elsevier Inc.  

## 2020-08-07 NOTE — Assessment & Plan Note (Signed)
Patient encouraged to maintain heart healthy diet, regular exercise, adequate sleep. Consider daily probiotics. Take medications as prescribed. Labs ordered and reviewed 

## 2020-08-13 ENCOUNTER — Other Ambulatory Visit: Payer: No Typology Code available for payment source

## 2020-08-13 ENCOUNTER — Encounter: Payer: Self-pay | Admitting: Family Medicine

## 2020-08-20 ENCOUNTER — Other Ambulatory Visit: Payer: Self-pay

## 2020-08-20 ENCOUNTER — Other Ambulatory Visit (INDEPENDENT_AMBULATORY_CARE_PROVIDER_SITE_OTHER): Payer: No Typology Code available for payment source

## 2020-08-20 DIAGNOSIS — Z0000 Encounter for general adult medical examination without abnormal findings: Secondary | ICD-10-CM

## 2020-08-20 LAB — TSH: TSH: 1.15 u[IU]/mL (ref 0.35–4.50)

## 2020-08-20 LAB — LIPID PANEL
Cholesterol: 235 mg/dL — ABNORMAL HIGH (ref 0–200)
HDL: 56.9 mg/dL (ref >39.00)
LDL Cholesterol: 140 mg/dL — ABNORMAL HIGH (ref 0–99)
NonHDL: 177.8
Total CHOL/HDL Ratio: 4
Triglycerides: 189 mg/dL — ABNORMAL HIGH (ref 0.0–149.0)
VLDL: 37.8 mg/dL (ref 0.0–40.0)

## 2020-08-20 LAB — COMPREHENSIVE METABOLIC PANEL
ALT: 15 U/L (ref 0–35)
AST: 17 U/L (ref 0–37)
Albumin: 4.4 g/dL (ref 3.5–5.2)
Alkaline Phosphatase: 46 U/L (ref 39–117)
BUN: 21 mg/dL (ref 6–23)
CO2: 26 mEq/L (ref 19–32)
Calcium: 10 mg/dL (ref 8.4–10.5)
Chloride: 104 mEq/L (ref 96–112)
Creatinine, Ser: 0.87 mg/dL (ref 0.40–1.20)
GFR: 83.45 mL/min (ref >60.00)
Glucose, Bld: 75 mg/dL (ref 70–99)
Potassium: 4.3 mEq/L (ref 3.5–5.1)
Sodium: 139 mEq/L (ref 135–145)
Total Bilirubin: 0.7 mg/dL (ref 0.2–1.2)
Total Protein: 6.6 g/dL (ref 6.0–8.3)

## 2020-08-20 LAB — CBC WITH DIFFERENTIAL/PLATELET
Basophils Absolute: 0.1 10*3/uL (ref 0.0–0.1)
Basophils Relative: 0.9 % (ref 0.0–3.0)
Eosinophils Absolute: 0.1 10*3/uL (ref 0.0–0.7)
Eosinophils Relative: 1.3 % (ref 0.0–5.0)
HCT: 41.4 % (ref 36.0–46.0)
Hemoglobin: 14 g/dL (ref 12.0–15.0)
Lymphocytes Relative: 19.9 % (ref 12.0–46.0)
Lymphs Abs: 1.9 10*3/uL (ref 0.7–4.0)
MCHC: 33.9 g/dL (ref 30.0–36.0)
MCV: 86.7 fl (ref 78.0–100.0)
Monocytes Absolute: 0.4 10*3/uL (ref 0.1–1.0)
Monocytes Relative: 4.5 % (ref 3.0–12.0)
Neutro Abs: 7.1 10*3/uL (ref 1.4–7.7)
Neutrophils Relative %: 73.4 % (ref 43.0–77.0)
Platelets: 368 10*3/uL (ref 150.0–400.0)
RBC: 4.77 Mil/uL (ref 3.87–5.11)
RDW: 12.1 % (ref 11.5–15.5)
WBC: 9.7 10*3/uL (ref 4.0–10.5)

## 2020-08-20 LAB — HEMOGLOBIN A1C: Hgb A1c MFr Bld: 5.5 % (ref 4.6–6.5)

## 2020-10-18 MED FILL — Norethindrone Ace-Ethinyl Estradiol-FE Tab 1 MG-20 MCG (24): ORAL | 84 days supply | Qty: 84 | Fill #1 | Status: AC

## 2020-10-20 ENCOUNTER — Other Ambulatory Visit (HOSPITAL_COMMUNITY): Payer: Self-pay

## 2020-10-31 ENCOUNTER — Telehealth: Payer: Self-pay | Admitting: Family Medicine

## 2020-10-31 NOTE — Telephone Encounter (Signed)
Cont'd Her appt was or CPE/

## 2020-10-31 NOTE — Telephone Encounter (Signed)
Pt states she revd a bill for  50.00 for a specialist copay for DOS  08/07/20.

## 2020-10-31 NOTE — Telephone Encounter (Signed)
Patient calling stating that billing has resolved this issue

## 2020-11-10 ENCOUNTER — Encounter: Payer: Self-pay | Admitting: Family Medicine

## 2020-11-11 ENCOUNTER — Other Ambulatory Visit: Payer: Self-pay | Admitting: Family Medicine

## 2020-11-11 MED ORDER — METHYLPHENIDATE HCL 10 MG PO TABS: 10.0000 mg | ORAL_TABLET | Freq: Two times a day (BID) | ORAL | 0 refills | Status: DC | PRN

## 2020-11-11 MED ORDER — METHYLPHENIDATE HCL 10 MG PO TABS
10.0000 mg | ORAL_TABLET | Freq: Two times a day (BID) | ORAL | 0 refills | Status: DC | PRN
  Filled 2020-11-11: qty 60, 30d supply, fill #0

## 2020-11-11 NOTE — Telephone Encounter (Signed)
I have pended medication for [facility] per patient request

## 2020-11-11 NOTE — Telephone Encounter (Signed)
Pt states she sent a my chart message but not sure if Dr. B got it in time, pt states [facility] contacted her regarding the rx, pt states she can only get that rx from [facility] outpatient pharmacy. Please send to that pharmacy.

## 2020-11-12 ENCOUNTER — Other Ambulatory Visit (HOSPITAL_COMMUNITY): Payer: Self-pay

## 2020-11-12 NOTE — Telephone Encounter (Signed)
Yes, RX has been canceled.

## 2021-01-07 ENCOUNTER — Encounter: Payer: Self-pay | Admitting: Family Medicine

## 2021-01-07 DIAGNOSIS — Z1283 Encounter for screening for malignant neoplasm of skin: Secondary | ICD-10-CM

## 2021-01-12 MED FILL — Norethindrone Ace-Ethinyl Estradiol-FE Tab 1 MG-20 MCG (24): ORAL | 84 days supply | Qty: 84 | Fill #2 | Status: AC

## 2021-01-13 ENCOUNTER — Other Ambulatory Visit (HOSPITAL_COMMUNITY): Payer: Self-pay

## 2021-01-21 ENCOUNTER — Other Ambulatory Visit (HOSPITAL_COMMUNITY): Payer: Self-pay

## 2021-01-21 MED FILL — Bupropion HCl Tab ER 24HR 150 MG: ORAL | 90 days supply | Qty: 180 | Fill #0 | Status: AC

## 2021-02-16 ENCOUNTER — Other Ambulatory Visit (HOSPITAL_COMMUNITY): Payer: Self-pay

## 2021-02-16 MED ORDER — BLISOVI 24 FE 1-20 MG-MCG(24) PO TABS
1.0000 | ORAL_TABLET | Freq: Every day | ORAL | 4 refills | Status: DC
  Filled 2021-02-16 – 2021-03-31 (×2): qty 84, 84d supply, fill #0
  Filled 2021-07-02: qty 84, 84d supply, fill #1
  Filled 2021-09-23: qty 84, 84d supply, fill #2
  Filled 2021-12-14: qty 84, 84d supply, fill #3

## 2021-02-17 LAB — RESULTS CONSOLE HPV: CHL HPV: NEGATIVE

## 2021-02-17 LAB — HM PAP SMEAR: HPV, high-risk: NOT DETECTED

## 2021-03-31 ENCOUNTER — Other Ambulatory Visit (HOSPITAL_COMMUNITY): Payer: Self-pay

## 2021-04-09 ENCOUNTER — Encounter: Payer: Self-pay | Admitting: Family Medicine

## 2021-04-10 ENCOUNTER — Other Ambulatory Visit: Payer: Self-pay | Admitting: Family Medicine

## 2021-04-10 MED ORDER — METHYLPHENIDATE HCL 10 MG PO TABS: 10.0000 mg | ORAL_TABLET | Freq: Two times a day (BID) | ORAL | 0 refills | Status: DC | PRN

## 2021-04-13 ENCOUNTER — Other Ambulatory Visit: Payer: Self-pay | Admitting: Family Medicine

## 2021-04-13 MED ORDER — METHYLPHENIDATE HCL 10 MG PO TABS
10.0000 mg | ORAL_TABLET | Freq: Two times a day (BID) | ORAL | 0 refills | Status: DC | PRN
  Filled 2021-04-13 – 2021-04-15 (×2): qty 60, 30d supply, fill #0

## 2021-04-14 ENCOUNTER — Other Ambulatory Visit (HOSPITAL_COMMUNITY): Payer: Self-pay

## 2021-04-14 ENCOUNTER — Ambulatory Visit (INDEPENDENT_AMBULATORY_CARE_PROVIDER_SITE_OTHER): Payer: No Typology Code available for payment source | Admitting: Family

## 2021-04-14 VITALS — BP 133/91 | HR 85 | Temp 98.5°F | Resp 16 | Wt 154.0 lb

## 2021-04-14 DIAGNOSIS — F988 Other specified behavioral and emotional disorders with onset usually occurring in childhood and adolescence: Secondary | ICD-10-CM

## 2021-04-14 DIAGNOSIS — F32A Depression, unspecified: Secondary | ICD-10-CM | POA: Diagnosis not present

## 2021-04-14 MED ORDER — BUPROPION HCL ER (XL) 150 MG PO TB24: 150.0000 mg | ORAL_TABLET | Freq: Every day | ORAL | 1 refills | Status: DC

## 2021-04-14 NOTE — Progress Notes (Signed)
Subjective:     Patient ID: Stacy Dennis, female    DOB: 1980/04/24, 41 y.o.   MRN: [id number]  Chief Complaint  Patient presents with   Anxiety    Here for follow up   Depression    HPI Patient is in today for follow up.  She was placed on ritalin 8 month ago.  She works at the hospital 3 days a week and  Also notes that it helps her focus.  She has only been taking in the morning but notes that her concentration wanes in the afternoon. When she needs to get a lot done at home she also takes it and finds herself more productive. The fact that she is better able to organize and is more productive has been improving her stress level and mood. She is only taking 150mg  of wellbutrin rather than 300 as she had insomnia on the 300mg . She is pleased with her current mood. Exercise has also been a good outlet for her stress.  Health Maintenance Due  Topic Date Due   Hepatitis C Screening  Never done   COVID-19 Vaccine (4 - Booster for Pfizer series) 05/08/2020   INFLUENZA VACCINE  10/20/2020    Past Medical History:  Diagnosis Date   Abnormal menses 06/17/2015   Allergic state 01/28/2012   Anxiety 07/06/2016   Dermatitis 06/17/2015   Endometriosis    H/O varicella    Hypercholesteremia    no meds   Hyperlipidemia 01/28/2012   Pregnancy related carpal tunnel syndrome, antepartum 2013   Preventative health care 01/28/2012   Verrucous skin lesion 01/28/2012    Past Surgical History:  Procedure Laterality Date   CESAREAN SECTION  10-10-11   CESAREAN SECTION N/A 12/10/2013   Procedure: CESAREAN SECTION;  Surgeon: Stacy Pearson, MD;  Location: [facility];  Service: Obstetrics;  Laterality: N/A;  Repeat edc 9/27   EYE SURGERY  2012   lasik   MYRINGOTOMY      Family History  Problem Relation Age of Onset   Anxiety disorder Mother    Hyperlipidemia Mother    Hypertension Mother    Irritable bowel syndrome Mother        ?   Cancer Mother        breast s/p lumpectomy and radiation    Kidney disease Father        stones   Hyperlipidemia Father    Cancer Paternal Grandfather        lung- smoker   Stroke Paternal Grandfather    Aneurysm Paternal Grandfather    Hyperlipidemia Paternal Grandmother    Dementia Paternal Grandmother    Hypertension Maternal Grandmother    Heart disease Maternal Grandfather    Thyroid disease Maternal Aunt    Crohn's disease Paternal Aunt     Social History   Socioeconomic History   Marital status: Married    Spouse name: Not on file   Number of children: Not on file   Years of education: Not on file   Highest education level: Not on file  Occupational History   Not on file  Tobacco Use   Smoking status: Never   Smokeless tobacco: Never  Substance and Sexual Activity   Alcohol use: Yes    Comment: socially when not pregnant   Drug use: No   Sexual activity: Yes    Partners: Male    Birth control/protection: None  Other Topics Concern   Not on file  Social History Narrative   Not on  file   Social Determinants of Health   Financial Resource Strain: Not on file  Food Insecurity: Not on file  Transportation Needs: Not on file  Physical Activity: Not on file  Stress: Not on file  Social Connections: Not on file  Intimate Partner Violence: Not on file    Outpatient Medications Prior to Visit  Medication Sig Dispense Refill   methylphenidate (RITALIN) 10 MG tablet Take 1 tablet (10 mg total) by mouth 2 (two) times daily as needed. 60 tablet 0   Norethindrone Acetate-Ethinyl Estrad-FE (BLISOVI 24 FE) 1-20 MG-MCG(24) tablet Take 1 tablet by mouth daily. 84 tablet 4   buPROPion (WELLBUTRIN XL) 150 MG 24 hr tablet Take 2 tablets (300 mg total) by mouth daily. 180 tablet 1   Norethindrone Acetate-Ethinyl Estrad-FE (BLISOVI 24 FE) 1-20 MG-MCG(24) tablet Take 1 tablet by mouth daily. 1 Package    buPROPion (WELLBUTRIN XL) 150 MG 24 hr tablet TAKE 1 TABLET (150 MG TOTAL) BY MOUTH DAILY. 90 tablet 4   No facility-administered  medications prior to visit.    Allergies  Allergen Reactions   Amoxicillin Nausea And Vomiting   Lactose Intolerance (Gi)     ROS See hpi      Objective:    Physical Exam Constitutional:      Appearance: Normal appearance.  Cardiovascular:     Rate and Rhythm: Normal rate.  Pulmonary:     Effort: Pulmonary effort is normal.  Neurological:     Mental Status: She is alert and oriented to person, place, and time.  Psychiatric:        Mood and Affect: Mood normal.        Behavior: Behavior normal.        Thought Content: Thought content normal.        Judgment: Judgment normal.    BP (!) 133/91 (BP Location: Right Arm, Patient Position: Sitting, Cuff Size: Small)    Pulse 85    Temp 98.5 F (36.9 C) (Oral)    Resp 16    Wt 154 lb (69.9 kg)    SpO2 100%    BMI 31.10 kg/m  Wt Readings from Last 3 Encounters:  04/14/21 154 lb (69.9 kg)  07/24/20 147 lb (66.7 kg)  05/29/20 147 lb (66.7 kg)       Assessment & Plan:   Problem List Items Addressed This Visit       Unprioritized   Depression    Stable. Continue Wellbutrin 150mg  xl once daily.      Relevant Medications   buPROPion (WELLBUTRIN XL) 150 MG 24 hr tablet   Attention deficit disorder - Primary    New.  Advised pt to increase the ritalin to BID on the days that she works. Obtain UDS, controlled substance contract is updated.       Relevant Orders   DRUG MONITORING, PANEL 8 WITH CONFIRMATION, URINE    I have discontinued [name]'s buPROPion. I have also changed her buPROPion. Additionally, I am having her maintain her Blisovi 24 Fe and methylphenidate.  Meds ordered this encounter  Medications   buPROPion (WELLBUTRIN XL) 150 MG 24 hr tablet    Sig: Take 1 tablet (150 mg total) by mouth daily.    Dispense:  90 tablet    Refill:  1    Order Specific Question:   Supervising Provider    Answer:   Penni Homans A [[id number]]

## 2021-04-15 ENCOUNTER — Other Ambulatory Visit (HOSPITAL_COMMUNITY): Payer: Self-pay

## 2021-04-15 DIAGNOSIS — F32A Depression, unspecified: Secondary | ICD-10-CM | POA: Insufficient documentation

## 2021-04-15 DIAGNOSIS — F988 Other specified behavioral and emotional disorders with onset usually occurring in childhood and adolescence: Secondary | ICD-10-CM | POA: Insufficient documentation

## 2021-04-15 DIAGNOSIS — F418 Other specified anxiety disorders: Secondary | ICD-10-CM | POA: Insufficient documentation

## 2021-04-15 LAB — DRUG MONITORING, PANEL 8 WITH CONFIRMATION, URINE
6 Acetylmorphine: NEGATIVE ng/mL (ref <10)
Alcohol Metabolites: NEGATIVE ng/mL (ref <500)
Amphetamines: NEGATIVE ng/mL (ref <500)
Benzodiazepines: NEGATIVE ng/mL (ref <100)
Buprenorphine, Urine: NEGATIVE ng/mL (ref <5)
Cocaine Metabolite: NEGATIVE ng/mL (ref <150)
Creatinine: 46.7 mg/dL (ref >=20.0)
MDMA: NEGATIVE ng/mL (ref <500)
Marijuana Metabolite: NEGATIVE ng/mL (ref <20)
Opiates: NEGATIVE ng/mL (ref <100)
Oxidant: NEGATIVE ug/mL (ref <200)
Oxycodone: NEGATIVE ng/mL (ref <100)
pH: 5.9 (ref 4.5–9.0)

## 2021-04-15 LAB — DM TEMPLATE

## 2021-04-15 NOTE — Assessment & Plan Note (Signed)
Stable. Continue Wellbutrin 150mg  xl once daily.

## 2021-04-15 NOTE — Assessment & Plan Note (Signed)
New.  Advised pt to increase the ritalin to BID on the days that she works. Obtain UDS, controlled substance contract is updated.

## 2021-04-29 ENCOUNTER — Other Ambulatory Visit (HOSPITAL_COMMUNITY): Payer: Self-pay

## 2021-06-01 ENCOUNTER — Other Ambulatory Visit: Payer: Self-pay | Admitting: Family Medicine

## 2021-06-02 ENCOUNTER — Other Ambulatory Visit (HOSPITAL_COMMUNITY): Payer: Self-pay

## 2021-06-02 MED ORDER — METHYLPHENIDATE HCL 10 MG PO TABS
10.0000 mg | ORAL_TABLET | Freq: Two times a day (BID) | ORAL | 0 refills | Status: DC | PRN
Start: 2021-06-02 — End: 2021-07-14
  Filled 2021-06-02: qty 60, 30d supply, fill #0

## 2021-06-02 NOTE — Telephone Encounter (Signed)
Requesting: Ritalin 10mg   ?Contract: 04/14/2021 ?UDS: 04/14/2021 ?Last Visit: 04/14/2021 w/ 04/16/2021 ?Next Visit: 11/10/2021 ?Last Refill: 04/13/2021 #60 and 0RF ? ?Please Advise ? ?

## 2021-06-21 ENCOUNTER — Encounter: Payer: Self-pay | Admitting: Family Medicine

## 2021-06-21 DIAGNOSIS — M79662 Pain in left lower leg: Secondary | ICD-10-CM

## 2021-06-22 ENCOUNTER — Ambulatory Visit (HOSPITAL_BASED_OUTPATIENT_CLINIC_OR_DEPARTMENT_OTHER): Payer: No Typology Code available for payment source

## 2021-06-23 ENCOUNTER — Ambulatory Visit (HOSPITAL_BASED_OUTPATIENT_CLINIC_OR_DEPARTMENT_OTHER)
Admission: RE | Admit: 2021-06-23 | Discharge: 2021-06-23 | Disposition: A | Discharge status: Home / Self Care | Payer: No Typology Code available for payment source | Source: Ambulatory Visit | Attending: Family Medicine | Admitting: Family Medicine

## 2021-06-23 ENCOUNTER — Other Ambulatory Visit (HOSPITAL_BASED_OUTPATIENT_CLINIC_OR_DEPARTMENT_OTHER): Payer: No Typology Code available for payment source

## 2021-06-23 ENCOUNTER — Ambulatory Visit (HOSPITAL_BASED_OUTPATIENT_CLINIC_OR_DEPARTMENT_OTHER): Payer: No Typology Code available for payment source

## 2021-06-23 ENCOUNTER — Encounter: Payer: Self-pay | Admitting: Family Medicine

## 2021-06-23 DIAGNOSIS — M79662 Pain in left lower leg: Secondary | ICD-10-CM | POA: Insufficient documentation

## 2021-07-02 ENCOUNTER — Other Ambulatory Visit (HOSPITAL_COMMUNITY): Payer: Self-pay

## 2021-07-14 ENCOUNTER — Other Ambulatory Visit (HOSPITAL_COMMUNITY): Payer: Self-pay

## 2021-07-14 ENCOUNTER — Other Ambulatory Visit: Payer: Self-pay | Admitting: Family Medicine

## 2021-07-14 MED ORDER — METHYLPHENIDATE HCL 10 MG PO TABS
10.0000 mg | ORAL_TABLET | Freq: Two times a day (BID) | ORAL | 0 refills | Status: DC | PRN
Start: 2021-07-14 — End: 2021-08-31
  Filled 2021-07-14: qty 60, 30d supply, fill #0

## 2021-07-14 NOTE — Telephone Encounter (Signed)
Requesting: Ritalin 10mg  ?Contract: 04/14/21 ?UDS: 04/14/21 ?Last Visit: 04/14/21 ?Next Visit: 11/10/21 ?Last Refill: 06/02/21 ? ?Please Advise  ?

## 2021-07-15 ENCOUNTER — Other Ambulatory Visit (HOSPITAL_COMMUNITY): Payer: Self-pay

## 2021-08-27 ENCOUNTER — Encounter: Payer: Self-pay | Admitting: Family Medicine

## 2021-08-27 ENCOUNTER — Other Ambulatory Visit (HOSPITAL_COMMUNITY): Payer: Self-pay

## 2021-08-27 MED ORDER — BUPROPION HCL ER (XL) 150 MG PO TB24
150.0000 mg | ORAL_TABLET | Freq: Every day | ORAL | 1 refills | Status: DC
Start: 2021-08-27 — End: 2022-01-19
  Filled 2021-08-27: qty 90, 90d supply, fill #0
  Filled 2021-11-24: qty 90, 90d supply, fill #1

## 2021-08-27 MED ORDER — BUPROPION HCL ER (XL) 150 MG PO TB24: 150.0000 mg | ORAL_TABLET | Freq: Every day | ORAL | 1 refills | Status: DC

## 2021-08-27 NOTE — Telephone Encounter (Signed)
Patient states refill was not sent to [facility] outpatient pharmacy and she needs it sent today. Please advise.

## 2021-08-31 ENCOUNTER — Other Ambulatory Visit (HOSPITAL_COMMUNITY): Payer: Self-pay

## 2021-08-31 ENCOUNTER — Other Ambulatory Visit: Payer: Self-pay | Admitting: Family Medicine

## 2021-08-31 MED ORDER — METHYLPHENIDATE HCL 10 MG PO TABS
10.0000 mg | ORAL_TABLET | Freq: Two times a day (BID) | ORAL | 0 refills | Status: DC | PRN
Start: 2021-08-31 — End: 2021-10-29
  Filled 2021-09-07: qty 60, 30d supply, fill #0

## 2021-08-31 NOTE — Telephone Encounter (Signed)
Requesting: Ritalin 10mg   Contract: 04/14/21 UDS: 04/14/21 Last Visit: 04/14/21 Next Visit: 11/10/21 Last Refill: 07/14/21 #60 and 0RF  Please Advise

## 2021-09-04 ENCOUNTER — Other Ambulatory Visit (HOSPITAL_COMMUNITY): Payer: Self-pay

## 2021-09-07 ENCOUNTER — Other Ambulatory Visit (HOSPITAL_COMMUNITY): Payer: Self-pay

## 2021-09-24 ENCOUNTER — Other Ambulatory Visit (HOSPITAL_COMMUNITY): Payer: Self-pay

## 2021-10-06 ENCOUNTER — Encounter: Payer: No Typology Code available for payment source | Admitting: Family Medicine

## 2021-10-29 ENCOUNTER — Other Ambulatory Visit: Payer: Self-pay | Admitting: Family Medicine

## 2021-10-29 ENCOUNTER — Other Ambulatory Visit (HOSPITAL_COMMUNITY): Payer: Self-pay

## 2021-10-29 MED ORDER — METHYLPHENIDATE HCL 10 MG PO TABS
10.0000 mg | ORAL_TABLET | Freq: Two times a day (BID) | ORAL | 0 refills | Status: DC | PRN
  Filled 2021-10-29: qty 60, 30d supply, fill #0

## 2021-11-10 ENCOUNTER — Encounter: Payer: Self-pay | Admitting: Family Medicine

## 2021-11-10 ENCOUNTER — Other Ambulatory Visit (HOSPITAL_COMMUNITY): Payer: Self-pay

## 2021-11-10 ENCOUNTER — Ambulatory Visit (INDEPENDENT_AMBULATORY_CARE_PROVIDER_SITE_OTHER): Payer: No Typology Code available for payment source | Admitting: Family Medicine

## 2021-11-10 VITALS — BP 136/84 | HR 93 | Temp 98.3°F | Resp 16 | Ht 59.0 in | Wt 155.8 lb

## 2021-11-10 DIAGNOSIS — Z0000 Encounter for general adult medical examination without abnormal findings: Secondary | ICD-10-CM | POA: Diagnosis not present

## 2021-11-10 DIAGNOSIS — F988 Other specified behavioral and emotional disorders with onset usually occurring in childhood and adolescence: Secondary | ICD-10-CM | POA: Diagnosis not present

## 2021-11-10 DIAGNOSIS — G8929 Other chronic pain: Secondary | ICD-10-CM | POA: Diagnosis not present

## 2021-11-10 DIAGNOSIS — E785 Hyperlipidemia, unspecified: Secondary | ICD-10-CM

## 2021-11-10 DIAGNOSIS — Z8639 Personal history of other endocrine, nutritional and metabolic disease: Secondary | ICD-10-CM | POA: Diagnosis not present

## 2021-11-10 DIAGNOSIS — M25562 Pain in left knee: Secondary | ICD-10-CM

## 2021-11-10 DIAGNOSIS — F418 Other specified anxiety disorders: Secondary | ICD-10-CM

## 2021-11-10 DIAGNOSIS — M25561 Pain in right knee: Secondary | ICD-10-CM

## 2021-11-10 MED ORDER — METHYLPHENIDATE HCL 10 MG PO TABS
10.0000 mg | ORAL_TABLET | Freq: Every day | ORAL | 0 refills | Status: DC | PRN
  Filled 2021-11-10: qty 30, 30d supply, fill #0

## 2021-11-10 MED ORDER — METHYLPHENIDATE HCL ER (LA) 20 MG PO CP24
20.0000 mg | ORAL_CAPSULE | ORAL | 0 refills | Status: DC
  Filled 2021-11-10: qty 30, 30d supply, fill #0

## 2021-11-10 NOTE — Progress Notes (Signed)
Subjective:   By signing my name below, I, Vickey Sages, attest that this documentation has been prepared under the direction and in the presence of Bradd Canary, MD 11/10/2021.    Patient ID: Stacy Dennis, female    DOB: Nov 25, 1980, 41 y.o.   MRN: [id number]  No chief complaint on file.  HPI Patient is in today for a comprehensive physical exam.  She denies having any fever, chills, ear pain, headaches, muscle pain, joint pain, new moles, rash, itching, congestion, sinus pain, sore throat, chest pain, palpitations, wheezing, nausea, vomitting, abdominal pain, diarrhea, constipation, blood in stool, dysuria, urgency, frequency and hematuria.  Family history: She reports that her mother was diagnosed with breast cancer in 2022.  Exercise: She works out 4 times a week.   Anxiety/ Depression: She expresses that she feels depressed due to her line of work. She is currently taking Bupropion 150 mg to manage her depression. She states that she was taking Bupropion 300 mg before, but it did affect her sleep, so she returned to taking Bupropion 150 mg. She states that some of her anxiety stems from her children. She reports that she has thought about counseling  and does journal to help manager her anxiety and depression.   Methylphenidate: She is currently taking Methylphenidate 10 mg to manager her ADHD.   Tiredness: She states that she wakes up at 4:10 am to work out, but this leaves her feeling tired. She says that she does not feel good about herself when she does not work out. She monitors her sleep using an Biomedical scientist.    Past Medical History:  Diagnosis Date   Abnormal menses 06/17/2015   Allergic state 01/28/2012   Anxiety 07/06/2016   Dermatitis 06/17/2015   Endometriosis    H/O varicella    Hypercholesteremia    no meds   Hyperlipidemia 01/28/2012   Pregnancy related carpal tunnel syndrome, antepartum 2013   Preventative health care 01/28/2012   Verrucous skin lesion  01/28/2012   Past Surgical History:  Procedure Laterality Date   CESAREAN SECTION  10-10-11   CESAREAN SECTION N/A 12/10/2013   Procedure: CESAREAN SECTION;  Surgeon: Zelphia Cairo, MD;  Location: [facility];  Service: Obstetrics;  Laterality: N/A;  Repeat edc 9/27   EYE SURGERY  2012   lasik   MYRINGOTOMY     Family History  Problem Relation Age of Onset   Anxiety disorder Mother    Hyperlipidemia Mother    Hypertension Mother    Irritable bowel syndrome Mother        ?   Cancer Mother        breast s/p lumpectomy and radiation   Kidney disease Father        stones   Hyperlipidemia Father    Cancer Paternal Grandfather        lung- smoker   Stroke Paternal Grandfather    Aneurysm Paternal Grandfather    Hyperlipidemia Paternal Grandmother    Dementia Paternal Grandmother    Hypertension Maternal Grandmother    Heart disease Maternal Grandfather    Thyroid disease Maternal Aunt    Crohn's disease Paternal Aunt    Social History   Socioeconomic History   Marital status: Married    Spouse name: Not on file   Number of children: Not on file   Years of education: Not on file   Highest education level: Not on file  Occupational History   Not on file  Tobacco Use  Smoking status: Never   Smokeless tobacco: Never  Substance and Sexual Activity   Alcohol use: Yes    Comment: socially when not pregnant   Drug use: No   Sexual activity: Yes    Partners: Male    Birth control/protection: None  Other Topics Concern   Not on file  Social History Narrative   Not on file   Social Determinants of Health   Financial Resource Strain: Not on file  Food Insecurity: Not on file  Transportation Needs: Not on file  Physical Activity: Not on file  Stress: Not on file  Social Connections: Not on file  Intimate Partner Violence: Not on file   Outpatient Medications Prior to Visit  Medication Sig Dispense Refill   buPROPion (WELLBUTRIN XL) 150 MG 24 hr tablet Take 1 tablet  (150 mg total) by mouth daily. 90 tablet 1   methylphenidate (RITALIN) 10 MG tablet Take 1 tablet (10 mg total) by mouth 2 (two) times daily as needed. 60 tablet 0   Norethindrone Acetate-Ethinyl Estrad-FE (BLISOVI 24 FE) 1-20 MG-MCG(24) tablet Take 1 tablet by mouth daily. 84 tablet 4   No facility-administered medications prior to visit.   Allergies  Allergen Reactions   Amoxicillin Nausea And Vomiting   Lactose Intolerance (Gi)    Review of Systems  Constitutional:  Negative for chills and fever.  HENT:  Negative for congestion, ear pain, sinus pain and sore throat.   Respiratory:  Negative for cough, shortness of breath and wheezing.   Cardiovascular:  Negative for chest pain and palpitations.  Gastrointestinal:  Negative for abdominal pain, blood in stool, constipation, diarrhea, nausea and vomiting.  Genitourinary:  Negative for dysuria, frequency, hematuria and urgency.  Musculoskeletal:  Negative for joint pain and myalgias.  Skin:  Negative for itching and rash.       (-) New moles.  Neurological:  Negative for headaches.      Objective:    Physical Exam Constitutional:      General: She is not in acute distress.    Appearance: Normal appearance. She is not ill-appearing.  HENT:     Head: Normocephalic and atraumatic.     Right Ear: Tympanic membrane, ear canal and external ear normal.     Left Ear: Tympanic membrane, ear canal and external ear normal.     Mouth/Throat:     Mouth: Mucous membranes are moist.     Pharynx: Oropharynx is clear.  Eyes:     Extraocular Movements: Extraocular movements intact.     Right eye: No nystagmus.     Left eye: No nystagmus.     Pupils: Pupils are equal, round, and reactive to light.  Neck:     Vascular: No carotid bruit.  Cardiovascular:     Rate and Rhythm: Normal rate and regular rhythm.     Pulses: Normal pulses.     Heart sounds: Normal heart sounds. No murmur heard.    No gallop.  Pulmonary:     Effort: Pulmonary  effort is normal. No respiratory distress.     Breath sounds: Normal breath sounds. No wheezing or rales.  Abdominal:     General: Bowel sounds are normal.     Palpations: Abdomen is soft.     Tenderness: There is no abdominal tenderness.  Musculoskeletal:     Comments: Muscle strength 5/5 on upper and lower extremities.  Lymphadenopathy:     Cervical: No cervical adenopathy.  Skin:    General: Skin is warm and dry.  Neurological:     Mental Status: She is alert and oriented to person, place, and time.     Deep Tendon Reflexes:     Reflex Scores:      Patellar reflexes are 2+ on the right side and 2+ on the left side. Psychiatric:        Mood and Affect: Mood normal.        Behavior: Behavior normal.        Judgment: Judgment normal.    There were no vitals taken for this visit. Wt Readings from Last 3 Encounters:  04/14/21 154 lb (69.9 kg)  07/24/20 147 lb (66.7 kg)  05/29/20 147 lb (66.7 kg)   Diabetic Foot Exam - Simple   No data filed    Lab Results  Component Value Date   WBC 9.7 08/20/2020   HGB 14.0 08/20/2020   HCT 41.4 08/20/2020   PLT 368.0 08/20/2020   GLUCOSE 75 08/20/2020   CHOL 235 (H) 08/20/2020   TRIG 189.0 (H) 08/20/2020   HDL 56.90 08/20/2020   LDLDIRECT 152.7 02/03/2012   LDLCALC 140 (H) 08/20/2020   ALT 15 08/20/2020   AST 17 08/20/2020   NA 139 08/20/2020   K 4.3 08/20/2020   CL 104 08/20/2020   CREATININE 0.87 08/20/2020   BUN 21 08/20/2020   CO2 26 08/20/2020   TSH 1.15 08/20/2020   HGBA1C 5.5 08/20/2020   Lab Results  Component Value Date   TSH 1.15 08/20/2020   Lab Results  Component Value Date   WBC 9.7 08/20/2020   HGB 14.0 08/20/2020   HCT 41.4 08/20/2020   MCV 86.7 08/20/2020   PLT 368.0 08/20/2020   Lab Results  Component Value Date   NA 139 08/20/2020   K 4.3 08/20/2020   CO2 26 08/20/2020   GLUCOSE 75 08/20/2020   BUN 21 08/20/2020   CREATININE 0.87 08/20/2020   BILITOT 0.7 08/20/2020   ALKPHOS 46 08/20/2020    AST 17 08/20/2020   ALT 15 08/20/2020   PROT 6.6 08/20/2020   ALBUMIN 4.4 08/20/2020   CALCIUM 10.0 08/20/2020   GFR 83.45 08/20/2020   Lab Results  Component Value Date   CHOL 235 (H) 08/20/2020   Lab Results  Component Value Date   HDL 56.90 08/20/2020   Lab Results  Component Value Date   LDLCALC 140 (H) 08/20/2020   Lab Results  Component Value Date   TRIG 189.0 (H) 08/20/2020   Lab Results  Component Value Date   CHOLHDL 4 08/20/2020   Lab Results  Component Value Date   HGBA1C 5.5 08/20/2020   Pap Smear: Last completed on 01/25/2020. Normal results. Repeat in 3-5 years.  Mammogram: Last completed on 01/24/2020. No mammographic evidence of malignancy. Repeat in 2022. Due.     Assessment & Plan:   Problem List Items Addressed This Visit   None  No orders of the defined types were placed in this encounter.  I, Vickey Sages, personally preformed the services described in this documentation.  All medical record entries made by the scribe were at my direction and in my presence.  I have reviewed the chart and discharge instructions (if applicable) and agree that the record reflects my personal performance and is accurate and complete. 11/10/2021  I,Mohammed Iqbal,acting as a scribe for Danise Edge, MD.,have documented all relevant documentation on the behalf of Danise Edge, MD,as directed by  Danise Edge, MD while in the presence of Danise Edge, MD.  Vickey Sages

## 2021-11-10 NOTE — Assessment & Plan Note (Signed)
Encourage heart healthy diet such as MIND or DASH diet, increase exercise, avoid trans fats, simple carbohydrates and processed foods, consider a krill or fish or flaxseed oil cap daily.  °

## 2021-11-10 NOTE — Assessment & Plan Note (Signed)
Patient encouraged to maintain heart healthy diet, regular exercise, adequate sleep. Consider daily probiotics. Take medications as prescribed. Labs ordered and reviewed. Pap and MGM with OB/GYN will request most recent

## 2021-11-10 NOTE — Patient Instructions (Addendum)
Yerba Matte tea do not drink  [employer] is a good resource for counselors   Preventive Care 27-41 Years Old, Female Preventive care refers to lifestyle choices and visits with your health care provider that can promote health and wellness. Preventive care visits are also called wellness exams. What can I expect for my preventive care visit? Counseling During your preventive care visit, your health care provider may ask about your: Medical history, including: Past medical problems. Family medical history. Pregnancy history. Current health, including: Menstrual cycle. Method of birth control. Emotional well-being. Home life and relationship well-being. Sexual activity and sexual health. Lifestyle, including: Alcohol, nicotine or tobacco, and drug use. Access to firearms. Diet, exercise, and sleep habits. Work and work Statistician. Sunscreen use. Safety issues such as seatbelt and bike helmet use. Physical exam Your health care provider may check your: Height and weight. These may be used to calculate your BMI (body mass index). BMI is a measurement that tells if you are at a healthy weight. Waist circumference. This measures the distance around your waistline. This measurement also tells if you are at a healthy weight and may help predict your risk of certain diseases, such as type 2 diabetes and high blood pressure. Heart rate and blood pressure. Body temperature. Skin for abnormal spots. What immunizations do I need?  Vaccines are usually given at various ages, according to a schedule. Your health care provider will recommend vaccines for you based on your age, medical history, and lifestyle or other factors, such as travel or where you work. What tests do I need? Screening Your health care provider may recommend screening tests for certain conditions. This may include: Pelvic exam and Pap test. Lipid and cholesterol levels. Diabetes screening. This is done by  checking your blood sugar (glucose) after you have not eaten for a while (fasting). Hepatitis B test. Hepatitis C test. HIV (human immunodeficiency virus) test. STI (sexually transmitted infection) testing, if you are at risk. BRCA-related cancer screening. This may be done if you have a family history of breast, ovarian, tubal, or peritoneal cancers. Talk with your health care provider about your test results, treatment options, and if necessary, the need for more tests. Follow these instructions at home: Eating and drinking  Eat a healthy diet that includes fresh fruits and vegetables, whole grains, lean protein, and low-fat dairy products. Take vitamin and mineral supplements as recommended by your health care provider. Do not drink alcohol if: Your health care provider tells you not to drink. You are pregnant, may be pregnant, or are planning to become pregnant. If you drink alcohol: Limit how much you have to 0-1 drink a day. Know how much alcohol is in your drink. In the [place] one drink equals one 12 oz bottle of beer (355 mL), one 5 oz glass of wine (148 mL), or one 1 oz glass of hard liquor (44 mL). Lifestyle Brush your teeth every morning and night with fluoride toothpaste. Floss one time each day. Exercise for at least 30 minutes 5 or more days each week. Do not use any products that contain nicotine or tobacco. These products include cigarettes, chewing tobacco, and vaping devices, such as e-cigarettes. If you need help quitting, ask your health care provider. Do not use drugs. If you are sexually active, practice safe sex. Use a condom or other form of protection to prevent STIs. If you do not wish to become pregnant, use a form of birth control. If you plan to become pregnant, see  your health care provider for a prepregnancy visit. Find healthy ways to manage stress, such as: Meditation, yoga, or listening to music. Journaling. Talking to a trusted person. Spending time  with friends and family. Minimize exposure to UV radiation to reduce your risk of skin cancer. Safety Always wear your seat belt while driving or riding in a vehicle. Do not drive: If you have been drinking alcohol. Do not ride with someone who has been drinking. If you have been using any mind-altering substances or drugs. While texting. When you are tired or distracted. Wear a helmet and other protective equipment during sports activities. If you have firearms in your house, make sure you follow all gun safety procedures. Seek help if you have been physically or sexually abused. What's next? Go to your health care provider once a year for an annual wellness visit. Ask your health care provider how often you should have your eyes and teeth checked. Stay up to date on all vaccines. This information is not intended to replace advice given to you by your health care provider. Make sure you discuss any questions you have with your health care provider. Document Revised: 09/03/2020 Document Reviewed: 09/03/2020 Elsevier Patient Education  [place].

## 2021-11-11 LAB — CBC
HCT: 39.6 % (ref 36.0–46.0)
Hemoglobin: 13.3 g/dL (ref 12.0–15.0)
MCHC: 33.7 g/dL (ref 30.0–36.0)
MCV: 88.3 fl (ref 78.0–100.0)
Platelets: 375 10*3/uL (ref 150.0–400.0)
RBC: 4.49 Mil/uL (ref 3.87–5.11)
RDW: 12.3 % (ref 11.5–15.5)
WBC: 9.4 10*3/uL (ref 4.0–10.5)

## 2021-11-11 LAB — COMPREHENSIVE METABOLIC PANEL
ALT: 15 U/L (ref 0–35)
AST: 19 U/L (ref 0–37)
Albumin: 4.5 g/dL (ref 3.5–5.2)
Alkaline Phosphatase: 44 U/L (ref 39–117)
BUN: 17 mg/dL (ref 6–23)
CO2: 25 mEq/L (ref 19–32)
Calcium: 9.1 mg/dL (ref 8.4–10.5)
Chloride: 101 mEq/L (ref 96–112)
Creatinine, Ser: 0.71 mg/dL (ref 0.40–1.20)
GFR: 105.58 mL/min (ref >60.00)
Glucose, Bld: 78 mg/dL (ref 70–99)
Potassium: 4 mEq/L (ref 3.5–5.1)
Sodium: 137 mEq/L (ref 135–145)
Total Bilirubin: 0.4 mg/dL (ref 0.2–1.2)
Total Protein: 6.9 g/dL (ref 6.0–8.3)

## 2021-11-11 LAB — LIPID PANEL
Cholesterol: 242 mg/dL — ABNORMAL HIGH (ref 0–200)
HDL: 66.9 mg/dL (ref >39.00)
LDL Cholesterol: 140 mg/dL — ABNORMAL HIGH (ref 0–99)
NonHDL: 174.81
Total CHOL/HDL Ratio: 4
Triglycerides: 175 mg/dL — ABNORMAL HIGH (ref 0.0–149.0)
VLDL: 35 mg/dL (ref 0.0–40.0)

## 2021-11-11 LAB — TSH: TSH: 1.45 u[IU]/mL (ref 0.35–5.50)

## 2021-11-11 NOTE — Assessment & Plan Note (Signed)
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 Will consider increasing her Wellbutrin if needed once her Ritalin dose has been adjusted.

## 2021-11-11 NOTE — Assessment & Plan Note (Signed)
Ritalin wearing off faster. Will try changing am dose to SR at 20 mg and may still use short acting 10 mg Ritalin in pm as needed

## 2021-11-24 ENCOUNTER — Other Ambulatory Visit (HOSPITAL_COMMUNITY): Payer: Self-pay

## 2021-12-14 ENCOUNTER — Other Ambulatory Visit: Payer: Self-pay | Admitting: Family Medicine

## 2021-12-14 ENCOUNTER — Other Ambulatory Visit (HOSPITAL_COMMUNITY): Payer: Self-pay

## 2021-12-14 MED ORDER — METHYLPHENIDATE HCL ER (LA) 20 MG PO CP24
20.0000 mg | ORAL_CAPSULE | ORAL | 0 refills | Status: DC
  Filled 2021-12-14: qty 20, 20d supply, fill #0
  Filled 2021-12-15: qty 10, 10d supply, fill #0

## 2021-12-14 NOTE — Telephone Encounter (Signed)
Requesting: Ritalin LA 20mg   Contract: 04/14/21 UDS: 04/14/21 Last Visit: 11/10/21 Next Visit: 01/21/22 Last Refill: 11/10/21 #30 and 0RF  Please Advise

## 2021-12-15 ENCOUNTER — Other Ambulatory Visit (HOSPITAL_COMMUNITY): Payer: Self-pay

## 2022-01-19 ENCOUNTER — Telehealth (INDEPENDENT_AMBULATORY_CARE_PROVIDER_SITE_OTHER): Payer: No Typology Code available for payment source | Admitting: Family Medicine

## 2022-01-19 DIAGNOSIS — E785 Hyperlipidemia, unspecified: Secondary | ICD-10-CM | POA: Diagnosis not present

## 2022-01-19 DIAGNOSIS — F988 Other specified behavioral and emotional disorders with onset usually occurring in childhood and adolescence: Secondary | ICD-10-CM | POA: Diagnosis not present

## 2022-01-19 DIAGNOSIS — F418 Other specified anxiety disorders: Secondary | ICD-10-CM

## 2022-01-19 MED ORDER — METHYLPHENIDATE HCL ER (LA) 20 MG PO CP24
20.0000 mg | ORAL_CAPSULE | ORAL | 0 refills | Status: DC
  Filled 2022-01-19: qty 30, 30d supply, fill #0

## 2022-01-19 MED ORDER — BUPROPION HCL ER (XL) 150 MG PO TB24
150.0000 mg | ORAL_TABLET | Freq: Every day | ORAL | 1 refills | Status: DC
Start: 2022-01-19 — End: 2022-04-05
  Filled 2022-01-19 – 2022-02-17 (×2): qty 90, 90d supply, fill #0

## 2022-01-19 MED ORDER — FLUOXETINE HCL 10 MG PO TABS
10.0000 mg | ORAL_TABLET | Freq: Every day | ORAL | 3 refills | Status: DC
  Filled 2022-01-19: qty 30, 30d supply, fill #0
  Filled 2022-02-17: qty 30, 30d supply, fill #1
  Filled 2022-03-17: qty 30, 30d supply, fill #2

## 2022-01-19 MED ORDER — METHYLPHENIDATE HCL 10 MG PO TABS
10.0000 mg | ORAL_TABLET | Freq: Every day | ORAL | 0 refills | Status: DC | PRN
  Filled 2022-01-19: qty 30, 30d supply, fill #0

## 2022-01-19 NOTE — Progress Notes (Signed)
MyChart Video Visit Virtual Visit via Video Note   This visit type was conducted due to national recommendations for restrictions regarding the COVID-19 Pandemic (e.g. social distancing) in an effort to limit this patient's exposure and mitigate transmission in our community. This patient is at least at moderate risk for complications without adequate follow up. This format is felt to be most appropriate for this patient at this time. Physical exam was limited by quality of the video and audio technology used for the visit. Shamaine, CMA, was able to get the patient set up on a video visit.  Patient location: Patient at home and provider in visit Provider location: Office  I discussed the limitations of evaluation and management by telemedicine and the availability of in person appointments. The patient expressed understanding and agreed to proceed.  Visit Date: 01/19/2022  Today's healthcare provider: Danise Edge, MD     Subjective:    Patient ID: Stacy Dennis, female    DOB: 1980/07/22, 41 y.o.   MRN: [id number]  No chief complaint on file.  HPI Patient is in today for a video visit.  Depression/Sleep: She states that she is not sleeping well and only 5 hours nightly. She is feeling stressed and tired due to her home life and work. She currently takes 1 tablet of Bupropion 150 mg and 1 capsule of Methylphenidate 20 mg daily. She reports that she took Bupropion 300 mg several years ago, but this caused her to have unusual dreams. She further reports that Escitalopram caused her to gain weight, which worsened her depression. However, she is open to taking Escitalopram again or other medications to help manage her depression. She is interested in receiving a referral to counseling.  Past Medical History:  Diagnosis Date   Abnormal menses 06/17/2015   Allergic state 01/28/2012   Anxiety 07/06/2016   Dermatitis 06/17/2015   Endometriosis    H/O varicella    Hypercholesteremia    no  meds   Hyperlipidemia 01/28/2012   Pregnancy related carpal tunnel syndrome, antepartum 2013   Preventative health care 01/28/2012   Verrucous skin lesion 01/28/2012   Past Surgical History:  Procedure Laterality Date   CESAREAN SECTION  10-10-11   CESAREAN SECTION N/A 12/10/2013   Procedure: CESAREAN SECTION;  Surgeon: Zelphia Cairo, MD;  Location: [facility];  Service: Obstetrics;  Laterality: N/A;  Repeat edc 9/27   EYE SURGERY  2012   lasik   MYRINGOTOMY     Family History  Problem Relation Age of Onset   Anxiety disorder Mother    Hyperlipidemia Mother    Hypertension Mother    Irritable bowel syndrome Mother        ?   Kidney disease Father        stones   Hyperlipidemia Father    Thyroid disease Maternal Aunt    Crohn's disease Paternal Aunt    Hypertension Maternal Grandmother    Heart disease Maternal Grandfather    Hyperlipidemia Paternal Grandmother    Dementia Paternal Grandmother    Cancer Paternal Grandfather        lung- smoker   Stroke Paternal Grandfather    Aneurysm Paternal Grandfather    Social History   Socioeconomic History   Marital status: Married    Spouse name: Not on file   Number of children: Not on file   Years of education: Not on file   Highest education level: Not on file  Occupational History   Not on file  Tobacco  Use   Smoking status: Never   Smokeless tobacco: Never  Substance and Sexual Activity   Alcohol use: Yes    Comment: socially when not pregnant   Drug use: No   Sexual activity: Yes    Partners: Male    Birth control/protection: None  Other Topics Concern   Not on file  Social History Narrative   Not on file   Social Determinants of Health   Financial Resource Strain: Not on file  Food Insecurity: Not on file  Transportation Needs: Not on file  Physical Activity: Not on file  Stress: Not on file  Social Connections: Not on file  Intimate Partner Violence: Not on file   Outpatient Medications Prior to Visit   Medication Sig Dispense Refill   buPROPion (WELLBUTRIN XL) 150 MG 24 hr tablet Take 1 tablet (150 mg total) by mouth daily. 90 tablet 1   methylphenidate (RITALIN LA) 20 MG 24 hr capsule Take 1 capsule (20 mg total) by mouth every morning. 30 capsule 0   methylphenidate (RITALIN) 10 MG tablet Take 1 tablet (10 mg total) by mouth daily as needed. 30 tablet 0   Norethindrone Acetate-Ethinyl Estrad-FE (BLISOVI 24 FE) 1-20 MG-MCG(24) tablet Take 1 tablet by mouth daily. 84 tablet 4   No facility-administered medications prior to visit.   Allergies  Allergen Reactions   Amoxicillin Nausea And Vomiting   Lactose Intolerance (Gi)    Review of Systems  Psychiatric/Behavioral:  Positive for depression.       Objective:    Physical Exam  There were no vitals taken for this visit. Wt Readings from Last 3 Encounters:  11/10/21 155 lb 12.8 oz (70.7 kg)  04/14/21 154 lb (69.9 kg)  07/24/20 147 lb (66.7 kg)   Diabetic Foot Exam - Simple   No data filed    Lab Results  Component Value Date   WBC 9.4 11/10/2021   HGB 13.3 11/10/2021   HCT 39.6 11/10/2021   PLT 375.0 11/10/2021   GLUCOSE 78 11/10/2021   CHOL 242 (H) 11/10/2021   TRIG 175.0 (H) 11/10/2021   HDL 66.90 11/10/2021   LDLDIRECT 152.7 02/03/2012   LDLCALC 140 (H) 11/10/2021   ALT 15 11/10/2021   AST 19 11/10/2021   NA 137 11/10/2021   K 4.0 11/10/2021   CL 101 11/10/2021   CREATININE 0.71 11/10/2021   BUN 17 11/10/2021   CO2 25 11/10/2021   TSH 1.45 11/10/2021   HGBA1C 5.5 08/20/2020   Lab Results  Component Value Date   TSH 1.45 11/10/2021   Lab Results  Component Value Date   WBC 9.4 11/10/2021   HGB 13.3 11/10/2021   HCT 39.6 11/10/2021   MCV 88.3 11/10/2021   PLT 375.0 11/10/2021   Lab Results  Component Value Date   NA 137 11/10/2021   K 4.0 11/10/2021   CO2 25 11/10/2021   GLUCOSE 78 11/10/2021   BUN 17 11/10/2021   CREATININE 0.71 11/10/2021   BILITOT 0.4 11/10/2021   ALKPHOS 44 11/10/2021    AST 19 11/10/2021   ALT 15 11/10/2021   PROT 6.9 11/10/2021   ALBUMIN 4.5 11/10/2021   CALCIUM 9.1 11/10/2021   GFR 105.58 11/10/2021   Lab Results  Component Value Date   CHOL 242 (H) 11/10/2021   Lab Results  Component Value Date   HDL 66.90 11/10/2021   Lab Results  Component Value Date   LDLCALC 140 (H) 11/10/2021   Lab Results  Component Value Date  TRIG 175.0 (H) 11/10/2021   Lab Results  Component Value Date   CHOLHDL 4 11/10/2021   Lab Results  Component Value Date   HGBA1C 5.5 08/20/2020      Assessment & Plan:   Problem List Items Addressed This Visit   None  No orders of the defined types were placed in this encounter.  I discussed the assessment and treatment plan with the patient. The patient was provided an opportunity to ask questions and all were answered. The patient agreed with the plan and demonstrated an understanding of the instructions.   The patient was advised to call back or seek an in-person evaluation if the symptoms worsen or if the condition fails to improve as anticipated.  I provided 20 minutes of face-to-face time during this encounter.  I,Mohammed Iqbal,acting as a scribe for Danise Edge, MD.,have documented all relevant documentation on the behalf of Danise Edge, MD,as directed by  Danise Edge, MD while in the presence of Danise Edge, MD.  Danise Edge, MD [facility] [phone] (phone) [phone] (fax)  [facility]

## 2022-01-19 NOTE — Assessment & Plan Note (Signed)
Continues to struggle 

## 2022-01-19 NOTE — Assessment & Plan Note (Signed)
On Wellbutrin 

## 2022-01-19 NOTE — Assessment & Plan Note (Signed)
Encourage heart healthy diet such as MIND or DASH diet, increase exercise, avoid trans fats, simple carbohydrates and processed foods, consider a krill or fish or flaxseed oil cap daily.  °

## 2022-01-19 NOTE — Patient Instructions (Signed)
Tetanus is due on 2023 Consider flu and covid boosters

## 2022-01-20 ENCOUNTER — Other Ambulatory Visit (HOSPITAL_COMMUNITY): Payer: Self-pay

## 2022-01-21 ENCOUNTER — Telehealth: Payer: No Typology Code available for payment source | Admitting: Family Medicine

## 2022-01-21 ENCOUNTER — Encounter: Payer: Self-pay | Admitting: Family Medicine

## 2022-01-22 ENCOUNTER — Telehealth: Payer: Self-pay

## 2022-01-22 NOTE — Telephone Encounter (Signed)
Called pt Lvm to call our office back  To make follow up appt for 8-10 weeks. Mailed the information  For [facility] out

## 2022-01-22 NOTE — Telephone Encounter (Signed)
Pt called back Follow up VV was made

## 2022-02-17 ENCOUNTER — Other Ambulatory Visit: Payer: Self-pay | Admitting: Family Medicine

## 2022-02-17 ENCOUNTER — Other Ambulatory Visit (HOSPITAL_COMMUNITY): Payer: Self-pay

## 2022-02-17 MED ORDER — METHYLPHENIDATE HCL ER (LA) 20 MG PO CP24
20.0000 mg | ORAL_CAPSULE | ORAL | 0 refills | Status: DC
  Filled 2022-02-17: qty 30, 30d supply, fill #0

## 2022-02-17 NOTE — Telephone Encounter (Signed)
Requesting: methylphenidate 20mg  24hr  Contract: 04/14/21 UDS: 04/14/21 Last Visit: 01/19/22 Next Visit: 04/05/22 Last Refill: 01/19/22 #30 and 0RF   Please Advise

## 2022-02-18 ENCOUNTER — Other Ambulatory Visit (HOSPITAL_COMMUNITY): Payer: Self-pay

## 2022-02-24 ENCOUNTER — Other Ambulatory Visit (HOSPITAL_COMMUNITY): Payer: Self-pay

## 2022-02-24 MED ORDER — BLISOVI 24 FE 1-20 MG-MCG(24) PO TABS
1.0000 | ORAL_TABLET | Freq: Every day | ORAL | 3 refills | Status: DC
  Filled 2022-02-24: qty 84, 84d supply, fill #0
  Filled 2022-06-08: qty 84, 84d supply, fill #1
  Filled 2022-08-30: qty 84, 84d supply, fill #2
  Filled 2022-11-22: qty 28, 28d supply, fill #3
  Filled 2022-12-14: qty 56, 56d supply, fill #4

## 2022-03-10 ENCOUNTER — Ambulatory Visit (INDEPENDENT_AMBULATORY_CARE_PROVIDER_SITE_OTHER): Payer: No Typology Code available for payment source | Admitting: Behavioral Health

## 2022-03-10 DIAGNOSIS — F988 Other specified behavioral and emotional disorders with onset usually occurring in childhood and adolescence: Secondary | ICD-10-CM | POA: Diagnosis not present

## 2022-03-10 DIAGNOSIS — F418 Other specified anxiety disorders: Secondary | ICD-10-CM | POA: Diagnosis not present

## 2022-03-10 NOTE — Progress Notes (Signed)
[facility] Behavioral Health Counselor Initial Adult Exam  Name: Stacy Dennis Date: 03/10/2022 MRN: [id number] DOB: [birth year] PCP: Bradd Canary, MD  Time spent: 60 min Caregility video; Pt is in private in bedroom @ home & Provider @ [facility]  Guardian/Payee:  Self    Paperwork requested: No   Reason for Visit /Presenting Problem: Elevated anx/dep since her children were born 10 yrs ago  Mental Status Exam: Appearance:   Casual     Behavior:  Appropriate and Sharing  Motor:  Normal  Speech/Language:   Clear and Coherent  Affect:  Appropriate  Mood:  normal  Thought process:  normal  Thought content:    WNL  Sensory/Perceptual disturbances:    WNL  Orientation:  oriented to person, place, and time/date  Attention:  Good  Concentration:  Good  Memory:  WNL  Fund of knowledge:   Good  Insight:    Good  Judgment:   Good  Impulse Control:  Good   Risk Assessment: Danger to Self:  No Self-injurious Behavior: No Danger to Others: No Duty to Warn:no Physical Aggression / Violence:No  Access to Firearms a concern: No  Gang Involvement:No  Patient / guardian was educated about steps to take if suicide or homicide risk level increases between visits: yes While future psychiatric events cannot be accurately predicted, the patient does not currently require acute inpatient psychiatric care and does not currently meet [facility] involuntary commitment criteria.  Substance Abuse History: Current substance abuse: No     Past Psychiatric History:   No previous psychological problems have been observed Outpatient Providers: Reuel Derby, MD History of Psych Hospitalization: No  Psychological Testing:  NA    Abuse History:  Victim of: No.,  NA    Report needed: No. Victim of Neglect:No. Perpetrator of  NA   Witness / Exposure to Domestic Violence: No   Protective Services Involvement: No  Witness to [employer] Violence:  No   Family History:  Family History   Problem Relation Age of Onset   Anxiety disorder Mother    Hyperlipidemia Mother    Hypertension Mother    Irritable bowel syndrome Mother        ?   Kidney disease Father        stones   Hyperlipidemia Father    Thyroid disease Maternal Aunt    Crohn's disease Paternal Aunt    Hypertension Maternal Grandmother    Heart disease Maternal Grandfather    Hyperlipidemia Paternal Grandmother    Dementia Paternal Grandmother    Cancer Paternal Grandfather        lung- smoker   Stroke Paternal Grandfather    Aneurysm Paternal Grandfather     Living situation: the patient lives with their family  Sexual Orientation: Straight  Relationship Status: married  Name of spouse / other: Sharia Reeve If a parent, number of children / ages:10yo Water engineer & 8yo Jax  Support Systems: spouse friends parents  Surveyor, quantity Stress:  No   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 History of legal issue / charges:  NA  Medical History/Surgical History: reviewed Past Medical History:  Diagnosis Date   Abnormal menses 06/17/2015   Allergic state 01/28/2012   Anxiety 07/06/2016   Dermatitis 06/17/2015   Endometriosis    H/O varicella    Hypercholesteremia    no meds   Hyperlipidemia 01/28/2012   Pregnancy related carpal tunnel syndrome, antepartum 2013   Preventative health care 01/28/2012    Verrucous skin lesion 01/28/2012    Past Surgical History:  Procedure Laterality Date   CESAREAN SECTION  10-10-11   CESAREAN SECTION N/A 12/10/2013   Procedure: CESAREAN SECTION;  Surgeon: Zelphia Cairo, MD;  Location: [facility];  Service: Obstetrics;  Laterality: N/A;  Repeat edc 9/27   EYE SURGERY  2012   lasik   MYRINGOTOMY      Medications: Current Outpatient Medications  Medication Sig Dispense Refill   buPROPion (WELLBUTRIN XL) 150 MG 24 hr tablet Take 1 tablet (150 mg total) by mouth daily. 90 tablet 1   FLUoxetine (PROZAC) 10 MG tablet Take 1 tablet (10 mg total) by mouth daily. 30 tablet 3   methylphenidate (RITALIN LA) 20 MG 24 hr capsule Take 1 capsule (20 mg total) by mouth every morning. 30 capsule 0   methylphenidate (RITALIN) 10 MG tablet Take 1 tablet (10 mg total) by mouth daily as needed. 30 tablet 0   Norethindrone Acetate-Ethinyl Estrad-FE (BLISOVI 24 FE) 1-20 MG-MCG(24) tablet Take 1 tablet by mouth daily. 84 tablet 3   No current facility-administered medications for this visit.    Allergies  Allergen Reactions   Amoxicillin Nausea And Vomiting   Lactose Intolerance (Gi)     Diagnoses:  Depression with anxiety  Attention deficit disorder, unspecified hyperactivity presence  Plan of Care: Pt will attend all scheduled sessions every 2-3 wks.  Target Date: 04/10/2022  Progress: 5  Frequency: Twice monthly  Modality: Indiv Pt will use the suggestions in session consistently to decrease her levels of anx/de & stress & report back next session.  Target Date: 04/10/2022  Progress: 0  Frequency: Twice monthly  Modality: Claretta Fraise, LMFT

## 2022-03-10 NOTE — Progress Notes (Signed)
                Stacy Dennis L Stacy Mosquera, LMFT 

## 2022-03-17 ENCOUNTER — Other Ambulatory Visit (HOSPITAL_COMMUNITY): Payer: Self-pay

## 2022-03-17 ENCOUNTER — Other Ambulatory Visit: Payer: Self-pay | Admitting: Family Medicine

## 2022-03-17 ENCOUNTER — Other Ambulatory Visit: Payer: Self-pay

## 2022-03-17 MED ORDER — METHYLPHENIDATE HCL ER (LA) 20 MG PO CP24
20.0000 mg | ORAL_CAPSULE | ORAL | 0 refills | Status: DC
  Filled 2022-03-17 – 2022-03-25 (×2): qty 30, 30d supply, fill #0

## 2022-03-17 NOTE — Telephone Encounter (Signed)
Requesting: Ritalin LA 20mg   Contract: 04/14/21 UDS: 04/14/21 Last Visit: 01/19/22 Next Visit: 04/05/22 Last Refill: 02/17/22 #30 and 0RF   Please Advise

## 2022-03-19 ENCOUNTER — Other Ambulatory Visit (HOSPITAL_COMMUNITY): Payer: Self-pay

## 2022-03-25 ENCOUNTER — Other Ambulatory Visit (HOSPITAL_COMMUNITY): Payer: Self-pay

## 2022-03-25 ENCOUNTER — Ambulatory Visit (INDEPENDENT_AMBULATORY_CARE_PROVIDER_SITE_OTHER): Payer: 59 | Admitting: Behavioral Health

## 2022-03-25 DIAGNOSIS — F988 Other specified behavioral and emotional disorders with onset usually occurring in childhood and adolescence: Secondary | ICD-10-CM

## 2022-03-25 DIAGNOSIS — F418 Other specified anxiety disorders: Secondary | ICD-10-CM | POA: Diagnosis not present

## 2022-03-25 NOTE — Progress Notes (Signed)
                Benjaman Artman L Clavin Ruhlman, LMFT 

## 2022-03-25 NOTE — Progress Notes (Signed)
[facility] Counselor/Therapist Progress Note  Patient ID: Stacy Dennis, MRN: [id number],    Date: 03/25/2022  Time Spent: 13 min In Person @ [facility] Office   Treatment Type: Individual Therapy  Reported Symptoms: Elevated anx/dep & stress due to the developmental trajectory of a young Family & managing ADHD, anx & worry w/her children  Mental Status Exam: Appearance:  Casual     Behavior: Appropriate and Sharing  Motor: Normal  Speech/Language:  Clear and Coherent  Affect: Appropriate  Mood: normal  Thought process: normal  Thought content:   WNL  Sensory/Perceptual disturbances:   WNL  Orientation: oriented to person, place, and time/date  Attention: Good  Concentration: Good  Memory: WNL  Fund of knowledge:  Good  Insight:   Good  Judgment:  Good  Impulse Control: Good   Risk Assessment: Danger to Self:  No Self-injurious Behavior: No Danger to Others: No Duty to Warn:no Physical Aggression / Violence:No  Access to Firearms a concern: No  Gang Involvement:No   Subjective: Pt is anxious today & saddened by her reflections upon her childhood upbringing as very strict. Pt was highly protected by Parents [place].   Interventions: Psycho-education/Bibliotherapy and Family Systems  Diagnosis:Attention deficit disorder, unspecified hyperactivity presence  Depression with anxiety  Plan: Stacy Dennis is concerned for her need to use [employer] as a means to control her anxiety. Normalized this concern & encouraged Pt to use the tools mentioned today for her 2 Sons & herself. She will use aquatic therapy when able, cont to go to the Gym on MWF & use the additional tools suggested for her Sons; for example; externalize anxiety by naming it & having fun calling it out or sending it to the corner for a break, use of squish balls to combat the anxiety "Bully", & loosen your grip on having to be, "the fixer". Focus on fixing yourself & your Sons  will benefit.  Target Date: 06/24/2022  Progress: 5  Frequency: Twice monthly  Modality: Indiv w/probable F Th in the future  [place], LMFT

## 2022-04-05 ENCOUNTER — Other Ambulatory Visit (HOSPITAL_COMMUNITY): Payer: Self-pay

## 2022-04-05 ENCOUNTER — Telehealth (INDEPENDENT_AMBULATORY_CARE_PROVIDER_SITE_OTHER): Payer: 59 | Admitting: Family Medicine

## 2022-04-05 DIAGNOSIS — F988 Other specified behavioral and emotional disorders with onset usually occurring in childhood and adolescence: Secondary | ICD-10-CM

## 2022-04-05 DIAGNOSIS — F418 Other specified anxiety disorders: Secondary | ICD-10-CM | POA: Diagnosis not present

## 2022-04-05 MED ORDER — METHYLPHENIDATE HCL ER (LA) 20 MG PO CP24
20.0000 mg | ORAL_CAPSULE | ORAL | 0 refills | Status: DC
  Filled 2022-04-05 – 2022-06-27 (×2): qty 30, 30d supply, fill #0

## 2022-04-05 MED ORDER — METHYLPHENIDATE HCL 10 MG PO TABS
10.0000 mg | ORAL_TABLET | Freq: Two times a day (BID) | ORAL | 0 refills | Status: DC
  Filled 2022-04-05: qty 30, 15d supply, fill #0

## 2022-04-05 MED ORDER — METHYLPHENIDATE HCL ER (LA) 20 MG PO CP24
20.0000 mg | ORAL_CAPSULE | ORAL | 0 refills | Status: DC
  Filled 2022-04-05 – 2022-04-19 (×2): qty 30, 30d supply, fill #0

## 2022-04-05 MED ORDER — BUPROPION HCL ER (XL) 150 MG PO TB24
150.0000 mg | ORAL_TABLET | Freq: Every day | ORAL | 1 refills | Status: DC
  Filled 2022-04-05 – 2022-06-08 (×2): qty 90, 90d supply, fill #0
  Filled 2022-09-14: qty 90, 90d supply, fill #1

## 2022-04-05 MED ORDER — METHYLPHENIDATE HCL 10 MG PO TABS
10.0000 mg | ORAL_TABLET | Freq: Two times a day (BID) | ORAL | 0 refills | Status: DC
  Filled 2022-04-05: qty 60, 30d supply, fill #0

## 2022-04-05 MED ORDER — METHYLPHENIDATE HCL 10 MG PO TABS
10.0000 mg | ORAL_TABLET | Freq: Every day | ORAL | 0 refills | Status: DC | PRN
  Filled 2022-04-05: qty 30, 30d supply, fill #0

## 2022-04-05 MED ORDER — METHYLPHENIDATE HCL ER (LA) 20 MG PO CP24
20.0000 mg | ORAL_CAPSULE | ORAL | 0 refills | Status: DC
  Filled 2022-04-05 – 2022-05-26 (×3): qty 30, 30d supply, fill #0

## 2022-04-05 MED ORDER — FLUOXETINE HCL 10 MG PO TABS
10.0000 mg | ORAL_TABLET | Freq: Every day | ORAL | 1 refills | Status: DC
  Filled 2022-04-05 – 2022-04-19 (×2): qty 90, 90d supply, fill #0
  Filled 2022-07-22: qty 90, 90d supply, fill #1

## 2022-04-05 NOTE — Progress Notes (Signed)
MyChart Video Visit    Virtual Visit via Video Note   This visit type was conducted due to national recommendations for restrictions regarding the COVID-19 Pandemic (e.g. social distancing) in an effort to limit this patient's exposure and mitigate transmission in our community. This patient is at least at moderate risk for complications without adequate follow up. This format is felt to be most appropriate for this patient at this time. Physical exam was limited by quality of the video and audio technology used for the visit. Shamaine, CMA was able to get the patient set up on a video visit.  Patient location: home Patient and provider in visit Provider location: Office  I discussed the limitations of evaluation and management by telemedicine and the availability of in person appointments. The patient expressed understanding and agreed to proceed.  Visit Date: 04/07/22  Today's healthcare provider: Danise Edge, MD     Subjective:    Patient ID: Stacy Dennis, female    DOB: 02-Jun-1980, 42 y.o.   MRN: [id number]  Chief Complaint  Patient presents with   Follow-up    Follow up    HPI Patient is in today for a follow up. She is overall feeling well.   She reports that she has been struggling to rest when trying to unwind. When she sits on her couch, she needs to keep her mind busy, which tends to be her getting on her phone. She is compliant with her Prozac and believes that overall it has been helping.  She voiced some concern for her blood pressure being too high. Her blood pressure has previously been 117-120 systolic and she's noticed that over this past year it has been around the 130s systolic range.   She has no family history of strokes before 80 years old. Her mother has a history of hypertension which she attributes to stress and anxiety. Grandmother is 20 and has dementia.   She is compliant with her Ritalin.  She denies having any fever, new muscle pain, new  joint pain, new moles, congestion, sinus pain, sore throat, chest pain, palpitations, cough, SOB, wheezing, n/v/d, constipation, blood in stool, dysuria, frequency, hematuria, or headaches at this time.  Past Medical History:  Diagnosis Date   Abnormal menses 06/17/2015   Allergic state 01/28/2012   Anxiety 07/06/2016   Dermatitis 06/17/2015   Endometriosis    H/O varicella    Hypercholesteremia    no meds   Hyperlipidemia 01/28/2012   Pregnancy related carpal tunnel syndrome, antepartum 2013   Preventative health care 01/28/2012   Verrucous skin lesion 01/28/2012    Past Surgical History:  Procedure Laterality Date   CESAREAN SECTION  10-10-11   CESAREAN SECTION N/A 12/10/2013   Procedure: CESAREAN SECTION;  Surgeon: Zelphia Cairo, MD;  Location: [facility];  Service: Obstetrics;  Laterality: N/A;  Repeat edc 9/27   EYE SURGERY  2012   lasik   MYRINGOTOMY      Family History  Problem Relation Age of Onset   Anxiety disorder Mother    Hyperlipidemia Mother    Hypertension Mother    Irritable bowel syndrome Mother        ?   Kidney disease Father        stones   Hyperlipidemia Father    Thyroid disease Maternal Aunt    Crohn's disease Paternal Aunt    Hypertension Maternal Grandmother    Heart disease Maternal Grandfather    Hyperlipidemia Paternal Grandmother    Dementia  Paternal Grandmother    Cancer Paternal Grandfather        lung- smoker   Stroke Paternal Grandfather    Aneurysm Paternal Grandfather     Social History   Socioeconomic History   Marital status: Married    Spouse name: Not on file   Number of children: Not on file   Years of education: Not on file   Highest education level: Not on file  Occupational History   Not on file  Tobacco Use   Smoking status: Never   Smokeless tobacco: Never  Substance and Sexual Activity   Alcohol use: Yes    Comment: socially when not pregnant   Drug use: No   Sexual activity: Yes    Partners: Male    Birth  control/protection: None  Other Topics Concern   Not on file  Social History Narrative   Not on file   Social Determinants of Health   Financial Resource Strain: Not on file  Food Insecurity: Not on file  Transportation Needs: Not on file  Physical Activity: Not on file  Stress: Not on file  Social Connections: Not on file  Intimate Partner Violence: Not on file    Outpatient Medications Prior to Visit  Medication Sig Dispense Refill   Norethindrone Acetate-Ethinyl Estrad-FE (BLISOVI 24 FE) 1-20 MG-MCG(24) tablet Take 1 tablet by mouth daily. 84 tablet 3   buPROPion (WELLBUTRIN XL) 150 MG 24 hr tablet Take 1 tablet (150 mg total) by mouth daily. 90 tablet 1   FLUoxetine (PROZAC) 10 MG tablet Take 1 tablet (10 mg total) by mouth daily. 30 tablet 3   methylphenidate (RITALIN LA) 20 MG 24 hr capsule Take 1 capsule (20 mg total) by mouth every morning. 30 capsule 0   methylphenidate (RITALIN) 10 MG tablet Take 1 tablet (10 mg total) by mouth daily as needed. 30 tablet 0   No facility-administered medications prior to visit.    Allergies  Allergen Reactions   Amoxicillin Nausea And Vomiting   Lactose Intolerance (Gi)     ROS     Objective:    Physical Exam  There were no vitals taken for this visit. Wt Readings from Last 3 Encounters:  11/10/21 155 lb 12.8 oz (70.7 kg)  04/14/21 154 lb (69.9 kg)  07/24/20 147 lb (66.7 kg)    Diabetic Foot Exam - Simple   No data filed    Lab Results  Component Value Date   WBC 9.4 11/10/2021   HGB 13.3 11/10/2021   HCT 39.6 11/10/2021   PLT 375.0 11/10/2021   GLUCOSE 78 11/10/2021   CHOL 242 (H) 11/10/2021   TRIG 175.0 (H) 11/10/2021   HDL 66.90 11/10/2021   LDLDIRECT 152.7 02/03/2012   LDLCALC 140 (H) 11/10/2021   ALT 15 11/10/2021   AST 19 11/10/2021   NA 137 11/10/2021   K 4.0 11/10/2021   CL 101 11/10/2021   CREATININE 0.71 11/10/2021   BUN 17 11/10/2021   CO2 25 11/10/2021   TSH 1.45 11/10/2021   HGBA1C 5.5  08/20/2020    Lab Results  Component Value Date   TSH 1.45 11/10/2021   Lab Results  Component Value Date   WBC 9.4 11/10/2021   HGB 13.3 11/10/2021   HCT 39.6 11/10/2021   MCV 88.3 11/10/2021   PLT 375.0 11/10/2021   Lab Results  Component Value Date   NA 137 11/10/2021   K 4.0 11/10/2021   CO2 25 11/10/2021   GLUCOSE 78 11/10/2021  BUN 17 11/10/2021   CREATININE 0.71 11/10/2021   BILITOT 0.4 11/10/2021   ALKPHOS 44 11/10/2021   AST 19 11/10/2021   ALT 15 11/10/2021   PROT 6.9 11/10/2021   ALBUMIN 4.5 11/10/2021   CALCIUM 9.1 11/10/2021   GFR 105.58 11/10/2021   Lab Results  Component Value Date   CHOL 242 (H) 11/10/2021   Lab Results  Component Value Date   HDL 66.90 11/10/2021   Lab Results  Component Value Date   LDLCALC 140 (H) 11/10/2021   Lab Results  Component Value Date   TRIG 175.0 (H) 11/10/2021   Lab Results  Component Value Date   CHOLHDL 4 11/10/2021   Lab Results  Component Value Date   HGBA1C 5.5 08/20/2020       Assessment & Plan:   Problem List Items Addressed This Visit     Attention deficit disorder - Primary    Continues to struggle has been taking the Ritalin LA 20 mg in am and the 10 mg in pm overall it is helping, no changes. Refills provided.      Depression with anxiety    Combination of Fluoxetine and Wellbutrin has been helpful no changes today. Refills given      Relevant Medications   buPROPion (WELLBUTRIN XL) 150 MG 24 hr tablet   FLUoxetine (PROZAC) 10 MG tablet   Meds ordered this encounter  Medications   DISCONTD: methylphenidate (RITALIN) 10 MG tablet    Sig: Take 1 tablet (10 mg total) by mouth daily as needed. January 2024 rx    Dispense:  30 tablet    Refill:  0   buPROPion (WELLBUTRIN XL) 150 MG 24 hr tablet    Sig: Take 1 tablet (150 mg total) by mouth daily.    Dispense:  90 tablet    Refill:  1   FLUoxetine (PROZAC) 10 MG tablet    Sig: Take 1 tablet (10 mg total) by mouth daily.     Dispense:  90 tablet    Refill:  1   methylphenidate (RITALIN LA) 20 MG 24 hr capsule    Sig: Take 1 capsule (20 mg total) by mouth every morning. January 2024 rx    Dispense:  30 capsule    Refill:  0   DISCONTD: methylphenidate (RITALIN) 10 MG tablet    Sig: Take 1 tablet (10 mg total) by mouth 2 (two) times daily. February 2024 rx    Dispense:  60 tablet    Refill:  0   methylphenidate (RITALIN LA) 20 MG 24 hr capsule    Sig: Take 1 capsule (20 mg total) by mouth every morning. March 2024 rx    Dispense:  30 capsule    Refill:  0   DISCONTD: methylphenidate (RITALIN) 10 MG tablet    Sig: Take 1 tablet (10 mg total) by mouth 2 (two) times daily. March 2024 rx    Dispense:  30 tablet    Refill:  0   methylphenidate (RITALIN LA) 20 MG 24 hr capsule    Sig: Take 1 capsule (20 mg total) by mouth every morning. February 2024 rx    Dispense:  30 capsule    Refill:  0   methylphenidate (RITALIN) 10 MG tablet    Sig: Take 1 tablet (10 mg total) by mouth daily as needed. January 2024 rx    Dispense:  30 tablet    Refill:  0   methylphenidate (RITALIN) 10 MG tablet    Sig: Take  1 tablet (10 mg total) by mouth daily as needed. February 2024 rx    Dispense:  30 tablet    Refill:  0   methylphenidate (RITALIN) 10 MG tablet    Sig: Take 1 tablet (10 mg total) by mouth daily as needed. March 2024 rx    Dispense:  30 tablet    Refill:  0   I discussed the assessment and treatment plan with the patient. The patient was provided an opportunity to ask questions and all were answered. The patient agreed with the plan and demonstrated an understanding of the instructions.   The patient was advised to call back or seek an in-person evaluation if the symptoms worsen or if the condition fails to improve as anticipated.   I,Rachel Rivera,acting as a scribe for Penni Homans, MD.,have documented all relevant documentation on the behalf of Penni Homans, MD,as directed by  Penni Homans, MD while in the  presence of Penni Homans, MD.    Penni Homans, MD [facility] at [facility] [phone] (phone) [phone] (fax)  [place]

## 2022-04-06 ENCOUNTER — Other Ambulatory Visit: Payer: Self-pay

## 2022-04-06 ENCOUNTER — Telehealth: Payer: Self-pay

## 2022-04-06 DIAGNOSIS — Z0000 Encounter for general adult medical examination without abnormal findings: Secondary | ICD-10-CM

## 2022-04-06 NOTE — Telephone Encounter (Signed)
done

## 2022-04-07 NOTE — Assessment & Plan Note (Signed)
Continues to struggle has been taking the Ritalin LA 20 mg in am and the 10 mg in pm overall it is helping, no changes. Refills provided.

## 2022-04-07 NOTE — Assessment & Plan Note (Addendum)
Combination of Fluoxetine and Wellbutrin has been helpful no changes today. Refills given

## 2022-04-08 ENCOUNTER — Ambulatory Visit (INDEPENDENT_AMBULATORY_CARE_PROVIDER_SITE_OTHER): Payer: 59 | Admitting: Behavioral Health

## 2022-04-08 DIAGNOSIS — F418 Other specified anxiety disorders: Secondary | ICD-10-CM

## 2022-04-08 DIAGNOSIS — F988 Other specified behavioral and emotional disorders with onset usually occurring in childhood and adolescence: Secondary | ICD-10-CM

## 2022-04-08 NOTE — Progress Notes (Signed)
[place] Counselor/Therapist Progress Note  Patient ID: Stacy Dennis, MRN: [id number],    Date: 04/08/2022  Time Spent: 74 min In Person @ [facility] Office   Treatment Type: Individual Therapy  Reported Symptoms: Elevated anx/dep & stress due to busy homelife w/Young Family & the juggle of 2 Professional Parents trying to make it work for the Family.  Mental Status Exam: Appearance:  Casual     Behavior: Appropriate, Sharing, and tearful  Motor: Normal  Speech/Language:  Clear and Coherent  Affect: Labile, & quickly able to regain her bearings  Mood: anxious  Thought process: normal  Thought content:   WNL  Sensory/Perceptual disturbances:   WNL  Orientation: oriented to person, place, and time/date  Attention: Good  Concentration: Good  Memory: WNL  Fund of knowledge:  Good  Insight:   Good  Judgment:  Good  Impulse Control: Good   Risk Assessment: Danger to Self:  No Self-injurious Behavior: No Danger to Others: No Duty to Warn:no Physical Aggression / Violence:No  Access to Firearms a concern: No  Gang Involvement:No   Subjective: Pt is stressed due to the demands of a busy [place] where her Husb's career situation is changing & she is frustrated w/the lack of equity in the home envir. She feels many of the duties fall on her & she needs her own time.   Pt is trying to put Family first & also feels the pull of multiple responsibilities as she seeks an improved formula for things to work for everyone.   Interventions: Solution-Oriented/Positive Psychology and Family Systems  Diagnosis:Attention deficit disorder, unspecified hyperactivity presence  Depression with anxiety  Plan: Stacy Dennis will try to find more time for herself to relax & not spend this time worrying. She will use it strictly for herself to rejuvenate.  Target Date: 05/09/2022  Progress: 4  Frequency: Twice monthly  Modality: Stacy Dennis will move her skills micromanaging  the Family & its schedule to teaching the rest of the Family how to do this in a general sense so the pressure is not so intense on her daily.  Target Date: 05/09/2022  Progress: 3 Frequency: Twice monthly  Modality: Stacy Reaper, LMFT

## 2022-04-08 NOTE — Progress Notes (Signed)
                Thomasenia Dowse L Brantlee Penn, LMFT 

## 2022-04-19 ENCOUNTER — Other Ambulatory Visit (HOSPITAL_COMMUNITY): Payer: Self-pay

## 2022-04-19 ENCOUNTER — Other Ambulatory Visit: Payer: Self-pay

## 2022-04-22 ENCOUNTER — Ambulatory Visit (INDEPENDENT_AMBULATORY_CARE_PROVIDER_SITE_OTHER): Payer: 59 | Admitting: Behavioral Health

## 2022-04-22 DIAGNOSIS — F418 Other specified anxiety disorders: Secondary | ICD-10-CM

## 2022-04-22 DIAGNOSIS — F988 Other specified behavioral and emotional disorders with onset usually occurring in childhood and adolescence: Secondary | ICD-10-CM | POA: Diagnosis not present

## 2022-04-22 NOTE — Progress Notes (Signed)
                Niles Ess L Akon Reinoso, LMFT 

## 2022-04-22 NOTE — Progress Notes (Signed)
[place] Counselor/Therapist Progress Note  Patient ID: Stacy Dennis, MRN: [id number],    Date: 04/22/2022  Time Spent: 50 min Caregility video; Pt is home in private & Provider working remote from [employer]   Treatment Type: Individual Therapy  Reported Symptoms: Elevated anx/dep & stressors from busy life of a Young Family  Mental Status Exam: Appearance:  Casual     Behavior: Appropriate and Sharing  Motor: Normal  Speech/Language:  Clear and Coherent  Affect: Appropriate  Mood: normal  Thought process: normal  Thought content:   WNL  Sensory/Perceptual disturbances:   WNL  Orientation: oriented to person, place, and time/date  Attention: Good  Concentration: Good  Memory: WNL  Fund of knowledge:  Good  Insight:   Good  Judgment:  Good  Impulse Control: Good   Risk Assessment: Danger to Self:  No Self-injurious Behavior: No Danger to Others: No Duty to Warn:no Physical Aggression / Violence:No  Access to Firearms a concern: No  Gang Involvement:No   Subjective: Pt reports her Family is adjusting to the changes over the Winter months; Daylight Savings Time, the Holidays, & the weather. She has decided not to pick up hours on Th/Fri. This has helped her feel some stability.   Pt has given her internal dialogue a review & works on this daily. She is trying to think about the Baseball season in new ways so she can manage the costs; both mentally & practically w/in her budget.    Interventions: Solution-Oriented/Positive Psychology  Diagnosis:Attention deficit disorder, unspecified hyperactivity presence  Depression with anxiety  Plan: Stacy Dennis is concerned for the Spring schedule in her [employer]. She has accepted the fact there will be no Spring Break. They will take advantage of the Summer Trip to [place] & do it differently. She is trying to accept all the changes @ both her own job & her Husb's job changes. She is, "holding her  breath" & feels in limbo due to Husb's intense job. She will cont to notice the changes & discuss this w/Stacy Dennis.  Target Date: 05/21/2022  Progress: 5  Frequency: Twice monthly  Modality: Boykin Reaper, LMFT

## 2022-04-29 ENCOUNTER — Other Ambulatory Visit (INDEPENDENT_AMBULATORY_CARE_PROVIDER_SITE_OTHER): Payer: 59

## 2022-04-29 DIAGNOSIS — Z0000 Encounter for general adult medical examination without abnormal findings: Secondary | ICD-10-CM

## 2022-04-29 LAB — COMPREHENSIVE METABOLIC PANEL
ALT: 15 U/L (ref 0–35)
AST: 18 U/L (ref 0–37)
Albumin: 4.2 g/dL (ref 3.5–5.2)
Alkaline Phosphatase: 40 U/L (ref 39–117)
BUN: 11 mg/dL (ref 6–23)
CO2: 25 mEq/L (ref 19–32)
Calcium: 9 mg/dL (ref 8.4–10.5)
Chloride: 105 mEq/L (ref 96–112)
Creatinine, Ser: 0.74 mg/dL (ref 0.40–1.20)
GFR: 100.14 mL/min (ref >60.00)
Glucose, Bld: 91 mg/dL (ref 70–99)
Potassium: 4.2 mEq/L (ref 3.5–5.1)
Sodium: 138 mEq/L (ref 135–145)
Total Bilirubin: 0.6 mg/dL (ref 0.2–1.2)
Total Protein: 6.3 g/dL (ref 6.0–8.3)

## 2022-04-29 LAB — LIPID PANEL
Cholesterol: 247 mg/dL — ABNORMAL HIGH (ref 0–200)
HDL: 61 mg/dL (ref >39.00)
LDL Cholesterol: 167 mg/dL — ABNORMAL HIGH (ref 0–99)
NonHDL: 186.27
Total CHOL/HDL Ratio: 4
Triglycerides: 95 mg/dL (ref 0.0–149.0)
VLDL: 19 mg/dL (ref 0.0–40.0)

## 2022-04-29 LAB — CBC WITH DIFFERENTIAL/PLATELET
Basophils Absolute: 0.1 10*3/uL (ref 0.0–0.1)
Basophils Relative: 1.5 % (ref 0.0–3.0)
Eosinophils Absolute: 0.1 10*3/uL (ref 0.0–0.7)
Eosinophils Relative: 1.5 % (ref 0.0–5.0)
HCT: 39.2 % (ref 36.0–46.0)
Hemoglobin: 13.4 g/dL (ref 12.0–15.0)
Lymphocytes Relative: 30.8 % (ref 12.0–46.0)
Lymphs Abs: 2.1 10*3/uL (ref 0.7–4.0)
MCHC: 34.2 g/dL (ref 30.0–36.0)
MCV: 87.8 fl (ref 78.0–100.0)
Monocytes Absolute: 0.4 10*3/uL (ref 0.1–1.0)
Monocytes Relative: 5.3 % (ref 3.0–12.0)
Neutro Abs: 4.1 10*3/uL (ref 1.4–7.7)
Neutrophils Relative %: 60.9 % (ref 43.0–77.0)
Platelets: 395 10*3/uL (ref 150.0–400.0)
RBC: 4.46 Mil/uL (ref 3.87–5.11)
RDW: 12.6 % (ref 11.5–15.5)
WBC: 6.7 10*3/uL (ref 4.0–10.5)

## 2022-04-29 LAB — TSH: TSH: 1.19 u[IU]/mL (ref 0.35–5.50)

## 2022-04-29 LAB — HEMOGLOBIN A1C: Hgb A1c MFr Bld: 5.2 % (ref 4.6–6.5)

## 2022-05-03 DIAGNOSIS — E785 Hyperlipidemia, unspecified: Secondary | ICD-10-CM

## 2022-05-03 DIAGNOSIS — Z0000 Encounter for general adult medical examination without abnormal findings: Secondary | ICD-10-CM

## 2022-05-03 DIAGNOSIS — Z8639 Personal history of other endocrine, nutritional and metabolic disease: Secondary | ICD-10-CM

## 2022-05-04 ENCOUNTER — Other Ambulatory Visit: Payer: Self-pay

## 2022-05-04 ENCOUNTER — Other Ambulatory Visit (HOSPITAL_COMMUNITY): Payer: Self-pay

## 2022-05-04 MED ORDER — ROSUVASTATIN CALCIUM 5 MG PO TABS
5.0000 mg | ORAL_TABLET | Freq: Every day | ORAL | 3 refills | Status: DC
  Filled 2022-05-04: qty 90, 90d supply, fill #0
  Filled 2022-07-22: qty 90, 90d supply, fill #1
  Filled 2022-10-29: qty 90, 90d supply, fill #2
  Filled 2023-01-27: qty 90, 90d supply, fill #3

## 2022-05-04 NOTE — Telephone Encounter (Signed)
Called pt medication sent, and  Lab appt made, labs ordered

## 2022-05-04 NOTE — Telephone Encounter (Signed)
Patient called back to make Shamaine aware that he received the message but she is out of town until Friday.

## 2022-05-13 ENCOUNTER — Ambulatory Visit (INDEPENDENT_AMBULATORY_CARE_PROVIDER_SITE_OTHER): Payer: 59 | Admitting: Behavioral Health

## 2022-05-13 DIAGNOSIS — F988 Other specified behavioral and emotional disorders with onset usually occurring in childhood and adolescence: Secondary | ICD-10-CM

## 2022-05-13 DIAGNOSIS — F418 Other specified anxiety disorders: Secondary | ICD-10-CM | POA: Diagnosis not present

## 2022-05-13 NOTE — Progress Notes (Signed)
                Meia Emley L Tashonda Pinkus, LMFT 

## 2022-05-13 NOTE — Progress Notes (Signed)
[place] Counselor/Therapist Progress Note  Patient ID: Stacy Dennis, MRN: [id number],    Date: 05/13/2022  Time Spent: 35 min In Person @ [facility] Office   Treatment Type: Individual Therapy  Reported Symptoms: Reduced anx/dep & stressors since her week of vacation last week skiing w/the children  Mental Status Exam: Appearance:  Casual     Behavior: Appropriate and Sharing  Motor: Normal  Speech/Language:  Clear and Coherent  Affect: Appropriate  Mood: normal  Thought process: normal  Thought content:   WNL  Sensory/Perceptual disturbances:   WNL  Orientation: oriented to person, place, and time/date  Attention: Good  Concentration: Good  Memory: WNL  Fund of knowledge:  Good  Insight:   Good  Judgment:  Good  Impulse Control: Good   Risk Assessment: Danger to Self:  No Self-injurious Behavior: No Danger to Others: No Duty to Warn:no Physical Aggression / Violence:No  Access to Firearms a concern: No  Gang Involvement:No   Subjective: Pt is sleepy & relaxed today. It is her Anniversary & she is mtg Husb for lunch downtown. She had a hectic trip to [place] to ski w/her children. They had a good time & she was not anxious. She found anxiety worsening on her return home. Her Sons are into Sports season & she feels stressed due to the schedule, but there are perks to managing it w/the other Families.   Interventions: Solution-Oriented/Positive Psychology and Family Systems  Diagnosis:Attention deficit disorder, unspecified hyperactivity presence  Depression with anxiety  Plan: Stacy Dennis is c/o her Husb's lack of contributing to the home. He will not follow through w/the chores he starts & she finds herself frustrated. She will consider him coming into psychotherapy for a Cpl session during the next few wks.   Target Date: 06/11/2022  Progress: 6  Frequency: Twice monthly  Modality: Boykin Reaper, LMFT

## 2022-05-27 ENCOUNTER — Other Ambulatory Visit (HOSPITAL_COMMUNITY): Payer: Self-pay

## 2022-06-03 ENCOUNTER — Ambulatory Visit (INDEPENDENT_AMBULATORY_CARE_PROVIDER_SITE_OTHER): Payer: 59 | Admitting: Behavioral Health

## 2022-06-03 DIAGNOSIS — F988 Other specified behavioral and emotional disorders with onset usually occurring in childhood and adolescence: Secondary | ICD-10-CM

## 2022-06-03 DIAGNOSIS — F418 Other specified anxiety disorders: Secondary | ICD-10-CM

## 2022-06-03 NOTE — Progress Notes (Signed)
[place] Counselor/Therapist Progress Note  Patient ID: Stacy Dennis, MRN: [id number],    Date: 06/03/2022  Time Spent: 52 min In Person @ [facility] Office   Treatment Type: Individual Therapy  Reported Symptoms: Elevated anx/dep due to daily life in a Young Family's dvlpmt.   Mental Status Exam: Appearance:  Casual     Behavior: Appropriate and Sharing  Motor: Normal  Speech/Language:  Clear and Coherent  Affect: Appropriate  Mood: Some tearfulness  Thought process: normal  Thought content:   WNL  Sensory/Perceptual disturbances:   WNL  Orientation: oriented to person, place, and time/date  Attention: Good  Concentration: Good  Memory: WNL  Fund of knowledge:  Good  Insight:   Good  Judgment:  Good  Impulse Control: Good   Risk Assessment: Danger to Self:  No Self-injurious Behavior: No Danger to Others: No Duty to Warn:no Physical Aggression / Violence:No  Access to Firearms a concern: No  Gang Involvement:No   Subjective: Stacy Dennis c/o a busy & even hectic life w/in her young family. She tries to manage & juggle it all w/minimal to modest assistance from her Husb. He is also a Nurse, learning disability & they juggle their 2 Sons' schedules well. She finds herself feeling daily stress & wants this to diminish in nature.    Interventions: Family Systems  Diagnosis:Attention deficit disorder, unspecified hyperactivity presence  Depression with anxiety  Plan: Stacy Dennis is an overthinker & cares deeply for her Family. She will cont to seek ways to care for herself & inc her time to relax & unwind.  Target Date: 07/04/2022  Progress: 5  Frequency: Twice monthly  Modality: Boykin Reaper, LMFT

## 2022-06-03 NOTE — Progress Notes (Signed)
                Stacy Flow L Jewelle Whitner, LMFT 

## 2022-06-08 ENCOUNTER — Other Ambulatory Visit (HOSPITAL_COMMUNITY): Payer: Self-pay

## 2022-06-08 ENCOUNTER — Other Ambulatory Visit: Payer: Self-pay

## 2022-06-28 ENCOUNTER — Other Ambulatory Visit (HOSPITAL_COMMUNITY): Payer: Self-pay

## 2022-06-28 ENCOUNTER — Other Ambulatory Visit: Payer: Self-pay

## 2022-07-08 ENCOUNTER — Ambulatory Visit (INDEPENDENT_AMBULATORY_CARE_PROVIDER_SITE_OTHER): Payer: 59 | Admitting: Behavioral Health

## 2022-07-08 DIAGNOSIS — F988 Other specified behavioral and emotional disorders with onset usually occurring in childhood and adolescence: Secondary | ICD-10-CM | POA: Diagnosis not present

## 2022-07-08 DIAGNOSIS — F418 Other specified anxiety disorders: Secondary | ICD-10-CM | POA: Diagnosis not present

## 2022-07-08 NOTE — Progress Notes (Signed)
                Wanona Stare L Jarrah Babich, LMFT 

## 2022-07-08 NOTE — Progress Notes (Signed)
[place] [facility] Counselor/Therapist Progress Note  Patient ID: Stacy Dennis, MRN: [id number],    Date: 07/08/2022  Time Spent: 55 min In Person @ [facility] Office   Treatment Type: Individual Therapy  Reported Symptoms: Reduction in anx/dep & stress due to having the day off to run her errands & check off her List of To Do's  Mental Status Exam: Appearance:  Casual     Behavior: Appropriate and Sharing  Motor: Normal  Speech/Language:  Clear and Coherent and Normal Rate  Affect: Appropriate  Mood: anxious  Thought process: normal  Thought content:   WNL  Sensory/Perceptual disturbances:   WNL  Orientation: oriented to person, place, and time/date  Attention: Good  Concentration: Good  Memory: WNL  Fund of knowledge:  Good  Insight:   Good - Improved over prior sessions as Pt is processing her Family situation w/a realistic view  Judgment:  Good  Impulse Control: Good - Improved w/the cont'd use of Wellbutrin   Risk Assessment: Danger to Self:  No Self-injurious Behavior: No Danger to Others: No Duty to Warn:no Physical Aggression / Violence:No  Access to Firearms a concern: No  Gang Involvement:No   Subjective: Pt is relaxed today. She realizes her medication is truly helping her to externalize less & deal w/her tension, worry, & over-thinking more effectively. In the 2 prior attempts w/medicine, Pt began to feel better & stopped medication. She knows the reason she feels better now is due to caring for herself in psychotherapy & being compliant w/medication. She is inc'gly noticing her mental health is improved.  Interventions: Family Systems; SFBT  Diagnosis:Attention deficit disorder, unspecified hyperactivity presence  Depression with anxiety  Plan: Stacy Dennis is trying to work w/her [employer] to also care for himself. He has fully boarded w/his new position, taking his own Clinical Pt load wkly since Jan. Stacy Dennis reminds him that Family is also vital to  his overall health & we discussed ways he can better manage his schedule as he settles into the flow. She will take this info & realize he can only negotiate so much during this first 6 mos. Encouraged Stacy Dennis to keep an open mind as he navigates this terrain. 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 She will record in her Notebook as this is helping her cope.  Target Date: Month of May  Progress: 7  Frequency: Once every 3-4 wks  Modality: Stacy Fraise, LMFT

## 2022-07-22 ENCOUNTER — Other Ambulatory Visit: Payer: Self-pay | Admitting: Family Medicine

## 2022-07-22 ENCOUNTER — Ambulatory Visit (INDEPENDENT_AMBULATORY_CARE_PROVIDER_SITE_OTHER): Payer: 59 | Admitting: Behavioral Health

## 2022-07-22 ENCOUNTER — Encounter: Payer: Self-pay | Admitting: Family Medicine

## 2022-07-22 ENCOUNTER — Other Ambulatory Visit: Payer: Self-pay

## 2022-07-22 DIAGNOSIS — F988 Other specified behavioral and emotional disorders with onset usually occurring in childhood and adolescence: Secondary | ICD-10-CM | POA: Diagnosis not present

## 2022-07-22 DIAGNOSIS — F418 Other specified anxiety disorders: Secondary | ICD-10-CM | POA: Diagnosis not present

## 2022-07-22 MED ORDER — METHYLPHENIDATE HCL ER (LA) 20 MG PO CP24
20.0000 mg | ORAL_CAPSULE | ORAL | 0 refills | Status: DC
  Filled 2022-07-22 – 2022-07-26 (×2): qty 30, 30d supply, fill #0

## 2022-07-22 NOTE — Progress Notes (Signed)
[place] [facility] Counselor/Therapist Progress Note  Patient ID: Stacy Dennis, MRN: [id number],    Date: 07/22/2022  Time Spent: 55 min In Person @ [facility] Office   Treatment Type: Individual Therapy  Reported Symptoms: Elevated anx/dep & stress due to the life of a Young Family  Mental Status Exam: Appearance:  Casual     Behavior: Appropriate and Sharing  Motor: Normal  Speech/Language:  Clear and Coherent  Affect: Appropriate  Mood: anxious  Thought process: normal  Thought content:   WNL  Sensory/Perceptual disturbances:   WNL  Orientation: oriented to person, place, and time/date  Attention: Good  Concentration: Good  Memory: WNL  Fund of knowledge:  Good  Insight:   Fair  Judgment:  Good  Impulse Control: Good   Risk Assessment: Danger to Self:  No Self-injurious Behavior: No Danger to Others: No Duty to Warn:no Physical Aggression / Violence:No  Access to Firearms a concern: No  Gang Involvement:No   Subjective: Pt is concerned for her plans in early June to help one of her Cousins w/her childcare. She has committed to this plan & is having a difficult time releasing her worry over all the details to make this happen. She is anxious & perseverating over this quick trip to [place].  Interventions: Family Systems  Diagnosis:Attention deficit disorder, unspecified hyperactivity presence  Depression with anxiety  Plan: Laneshia will try to practice mindfulness tips to stay in the moment daily so her worry does not consume her. She has a PCP visit this week & will discuss her medications to see if they can change anything to assist her coping. Provided resources; luna.com, [place], & calm.com for added benefit.  Target Date: 09/04/2022  Progress: 3  Frequency: Once every 3-4 wks  Modality: Claretta Fraise, LMFT

## 2022-07-22 NOTE — Progress Notes (Signed)
                Shervin Cypert L Charitie Hinote, LMFT 

## 2022-07-23 ENCOUNTER — Other Ambulatory Visit (HOSPITAL_COMMUNITY): Payer: Self-pay

## 2022-07-23 ENCOUNTER — Other Ambulatory Visit: Payer: Self-pay

## 2022-07-23 DIAGNOSIS — N926 Irregular menstruation, unspecified: Secondary | ICD-10-CM

## 2022-07-23 NOTE — Addendum Note (Signed)
Addended by: Kathi Ludwig on: 07/23/2022 12:43 PM   Modules accepted: Orders

## 2022-07-23 NOTE — Addendum Note (Signed)
Addended by: Kathi Ludwig on: 07/23/2022 12:44 PM   Modules accepted: Orders

## 2022-07-26 ENCOUNTER — Other Ambulatory Visit (HOSPITAL_COMMUNITY): Payer: Self-pay

## 2022-08-04 NOTE — Assessment & Plan Note (Signed)
On wellbutrin and Fluoxetine

## 2022-08-04 NOTE — Assessment & Plan Note (Signed)
Encourage heart healthy diet such as MIND or DASH diet, increase exercise, avoid trans fats, simple carbohydrates and processed foods, consider a krill or fish or flaxseed oil cap daily.  °

## 2022-08-04 NOTE — Assessment & Plan Note (Signed)
Stable on current meds 

## 2022-08-05 ENCOUNTER — Encounter: Payer: Self-pay | Admitting: Family Medicine

## 2022-08-05 ENCOUNTER — Other Ambulatory Visit: Payer: 59

## 2022-08-05 ENCOUNTER — Other Ambulatory Visit (HOSPITAL_COMMUNITY): Payer: Self-pay

## 2022-08-05 ENCOUNTER — Ambulatory Visit (INDEPENDENT_AMBULATORY_CARE_PROVIDER_SITE_OTHER): Payer: 59 | Admitting: Family Medicine

## 2022-08-05 VITALS — BP 138/89 | HR 84 | Temp 98.0°F | Resp 16 | Ht 59.0 in | Wt 155.0 lb

## 2022-08-05 DIAGNOSIS — R739 Hyperglycemia, unspecified: Secondary | ICD-10-CM | POA: Diagnosis not present

## 2022-08-05 DIAGNOSIS — Z8639 Personal history of other endocrine, nutritional and metabolic disease: Secondary | ICD-10-CM

## 2022-08-05 DIAGNOSIS — E785 Hyperlipidemia, unspecified: Secondary | ICD-10-CM

## 2022-08-05 DIAGNOSIS — N926 Irregular menstruation, unspecified: Secondary | ICD-10-CM

## 2022-08-05 DIAGNOSIS — F418 Other specified anxiety disorders: Secondary | ICD-10-CM | POA: Diagnosis not present

## 2022-08-05 DIAGNOSIS — Z79899 Other long term (current) drug therapy: Secondary | ICD-10-CM

## 2022-08-05 DIAGNOSIS — Z803 Family history of malignant neoplasm of breast: Secondary | ICD-10-CM

## 2022-08-05 DIAGNOSIS — F988 Other specified behavioral and emotional disorders with onset usually occurring in childhood and adolescence: Secondary | ICD-10-CM | POA: Diagnosis not present

## 2022-08-05 DIAGNOSIS — Z0000 Encounter for general adult medical examination without abnormal findings: Secondary | ICD-10-CM

## 2022-08-05 LAB — COMPREHENSIVE METABOLIC PANEL
ALT: 17 U/L (ref 0–35)
AST: 21 U/L (ref 0–37)
Albumin: 4.3 g/dL (ref 3.5–5.2)
Alkaline Phosphatase: 43 U/L (ref 39–117)
BUN: 12 mg/dL (ref 6–23)
CO2: 27 mEq/L (ref 19–32)
Calcium: 9.5 mg/dL (ref 8.4–10.5)
Chloride: 104 mEq/L (ref 96–112)
Creatinine, Ser: 0.75 mg/dL (ref 0.40–1.20)
GFR: 98.35 mL/min (ref >60.00)
Glucose, Bld: 92 mg/dL (ref 70–99)
Potassium: 4.9 mEq/L (ref 3.5–5.1)
Sodium: 138 mEq/L (ref 135–145)
Total Bilirubin: 0.6 mg/dL (ref 0.2–1.2)
Total Protein: 6.5 g/dL (ref 6.0–8.3)

## 2022-08-05 LAB — CBC WITH DIFFERENTIAL/PLATELET
Basophils Absolute: 0.1 10*3/uL (ref 0.0–0.1)
Basophils Relative: 1.4 % (ref 0.0–3.0)
Eosinophils Absolute: 0.1 10*3/uL (ref 0.0–0.7)
Eosinophils Relative: 1.7 % (ref 0.0–5.0)
HCT: 40.3 % (ref 36.0–46.0)
Hemoglobin: 13.7 g/dL (ref 12.0–15.0)
Lymphocytes Relative: 24.8 % (ref 12.0–46.0)
Lymphs Abs: 1.8 10*3/uL (ref 0.7–4.0)
MCHC: 34.1 g/dL (ref 30.0–36.0)
MCV: 88.2 fl (ref 78.0–100.0)
Monocytes Absolute: 0.3 10*3/uL (ref 0.1–1.0)
Monocytes Relative: 4.8 % (ref 3.0–12.0)
Neutro Abs: 4.8 10*3/uL (ref 1.4–7.7)
Neutrophils Relative %: 67.3 % (ref 43.0–77.0)
Platelets: 395 10*3/uL (ref 150.0–400.0)
RBC: 4.56 Mil/uL (ref 3.87–5.11)
RDW: 12.7 % (ref 11.5–15.5)
WBC: 7.1 10*3/uL (ref 4.0–10.5)

## 2022-08-05 LAB — LIPID PANEL
Cholesterol: 170 mg/dL (ref 0–200)
HDL: 63.2 mg/dL (ref >39.00)
LDL Cholesterol: 90 mg/dL (ref 0–99)
NonHDL: 107.08
Total CHOL/HDL Ratio: 3
Triglycerides: 85 mg/dL (ref 0.0–149.0)
VLDL: 17 mg/dL (ref 0.0–40.0)

## 2022-08-05 LAB — TSH: TSH: 1.2 u[IU]/mL (ref 0.35–5.50)

## 2022-08-05 LAB — HEMOGLOBIN A1C: Hgb A1c MFr Bld: 5.3 % (ref 4.6–6.5)

## 2022-08-05 LAB — CORTISOL: Cortisol, Plasma: 12.9 ug/dL

## 2022-08-05 MED ORDER — FLUOXETINE HCL 20 MG PO TABS
20.0000 mg | ORAL_TABLET | Freq: Every day | ORAL | 1 refills | Status: DC
  Filled 2022-08-05: qty 90, 90d supply, fill #0
  Filled 2022-11-02: qty 90, 90d supply, fill #1

## 2022-08-05 NOTE — Progress Notes (Signed)
Subjective:   By signing my name below, I, Vickey Sages, attest that this documentation has been prepared under the direction and in the presence of Bradd Canary, MD., 08/05/2022.   Patient ID: Stacy Dennis, female    DOB: 17-Feb-1981, 42 y.o.   MRN: [id number]  Chief Complaint  Patient presents with   Follow-up    Follow up   HPI Patient is in today for an office visit.   Attention Deficit Disorder (ADD) Patient continues taking Methylphenidate 10-20 mg and continues to follow with counselor Dr. Fredia Sorrow to manage ADD. She states that counseling has been beneficial and Methylphenidate has been tolerable. She does well on this medication but is unsure if she would benefit from an increased dosage.  Depression with Anxiety Patient continues taking Bupropion 150 mg and Fluoxetine 10 mg to manage her depression with anxiety which have been tolerable. She follows with counselor Dr. Fredia Sorrow and reports that she is able to handle minor obstacles, but has been feeling overwhelmed and stressed due to the consequences of her maternal aunt's recent breast cancer diagnosis. She is interesting in increasing the dosage of her Fluoxetine to 20 mg.   Elevated Blood Pressure Patient's blood pressure (bp) is slightly elevated today upon arrival (138/89). She does not check her bp at home but denies any associated symptoms of hypertension, such as headaches and fatigue. Today she denies CP/palpitations/SOB/HA/fever/chills/GI or GU symptoms. BP Readings from Last 3 Encounters:  08/05/22 138/89  11/10/21 136/84  04/14/21 (!) 133/91   Pulse Readings from Last 3 Encounters:  08/05/22 84  11/10/21 93  04/14/21 85   Family History of Breast Cancer Patient's mother was diagnosed with breast cancer at 42 yo and underwent radiation and lumpectomy. She currently takes Tamoxifen. Patient's 84 yo maternal aunt has recently been diagnosed with breast cancer that is triple negative and  currently receives chemotherapy. Her second maternal aunt has thyroid disease and possible thyroid cancer. Patient is interested in genetic counseling due to this significant family history.  Hyperlipidemia Patient continues taking Rosuvastatin 5 mg and is awaiting blood work results to see the effects of this medication. Lab Results  Component Value Date   CHOL 170 08/05/2022   HDL 63.20 08/05/2022   LDLCALC 90 08/05/2022   LDLDIRECT 152.7 02/03/2012   TRIG 85.0 08/05/2022   CHOLHDL 3 08/05/2022   Past Medical History:  Diagnosis Date   Abnormal menses 06/17/2015   Allergic state 01/28/2012   Anxiety 07/06/2016   Dermatitis 06/17/2015   Endometriosis    H/O varicella    Hypercholesteremia    no meds   Hyperlipidemia 01/28/2012   Pregnancy related carpal tunnel syndrome, antepartum 2013   Preventative health care 01/28/2012   Verrucous skin lesion 01/28/2012    Past Surgical History:  Procedure Laterality Date   CESAREAN SECTION  10-10-11   CESAREAN SECTION N/A 12/10/2013   Procedure: CESAREAN SECTION;  Surgeon: Zelphia Cairo, MD;  Location: [facility];  Service: Obstetrics;  Laterality: N/A;  Repeat edc 9/27   EYE SURGERY  2012   lasik   MYRINGOTOMY      Family History  Problem Relation Age of Onset   Cancer Mother 37       breast, s/p radiation and lumpectomy, on Tamoxifen   Anxiety disorder Mother    Hyperlipidemia Mother    Hypertension Mother    Irritable bowel syndrome Mother        ?   Kidney disease Father  stones   Hyperlipidemia Father    Cancer Maternal Aunt 60       triple negative receiving chemo now   Thyroid disease Maternal Aunt    Thyroid disease Maternal Aunt        s/p thyroidectomy possibly for cancer   Thyroid disease Paternal Aunt    Hypertension Maternal Grandmother    Anxiety disorder Maternal Grandmother    Heart disease Maternal Grandfather    Hyperlipidemia Paternal Grandmother    Dementia Paternal Grandmother    Other Paternal  Grandmother        dementia   Cancer Paternal Grandfather        lung- smoker   Stroke Paternal Grandfather    Aneurysm Paternal Grandfather    Cerebral aneurysm Paternal Grandfather     Social History   Socioeconomic History   Marital status: Married    Spouse name: Not on file   Number of children: Not on file   Years of education: Not on file   Highest education level: Not on file  Occupational History   Not on file  Tobacco Use   Smoking status: Never   Smokeless tobacco: Never  Substance and Sexual Activity   Alcohol use: Yes    Comment: socially when not pregnant   Drug use: No   Sexual activity: Yes    Partners: Male    Birth control/protection: None  Other Topics Concern   Not on file  Social History Narrative   Not on file   Social Determinants of Health   Financial Resource Strain: Not on file  Food Insecurity: Not on file  Transportation Needs: Not on file  Physical Activity: Not on file  Stress: Not on file  Social Connections: Not on file  Intimate Partner Violence: Not on file    Outpatient Medications Prior to Visit  Medication Sig Dispense Refill   buPROPion (WELLBUTRIN XL) 150 MG 24 hr tablet Take 1 tablet (150 mg total) by mouth daily. 90 tablet 1   methylphenidate (RITALIN LA) 20 MG 24 hr capsule Take 1 capsule (20 mg total) by mouth every morning. March 2024 rx 30 capsule 0   methylphenidate (RITALIN LA) 20 MG 24 hr capsule Take 1 capsule (20 mg total) by mouth every morning. 30 capsule 0   methylphenidate (RITALIN LA) 20 MG 24 hr capsule Take 1 capsule (20 mg total) by mouth every morning. 30 capsule 0   methylphenidate (RITALIN) 10 MG tablet Take 1 tablet (10 mg total) by mouth daily as needed. January 2024 rx 30 tablet 0   methylphenidate (RITALIN) 10 MG tablet Take 1 tablet (10 mg total) by mouth daily as needed. February 2024 rx 30 tablet 0   methylphenidate (RITALIN) 10 MG tablet Take 1 tablet (10 mg total) by mouth daily as needed. March  2024 rx 30 tablet 0   Norethindrone Acetate-Ethinyl Estrad-FE (BLISOVI 24 FE) 1-20 MG-MCG(24) tablet Take 1 tablet by mouth daily. 84 tablet 3   rosuvastatin (CRESTOR) 5 MG tablet Take 1 tablet (5 mg total) by mouth daily. 90 tablet 3   FLUoxetine (PROZAC) 10 MG tablet Take 1 tablet (10 mg total) by mouth daily. 90 tablet 1   No facility-administered medications prior to visit.    Allergies  Allergen Reactions   Amoxicillin Nausea And Vomiting   Lactose Intolerance (Gi)     Review of Systems  Constitutional:  Negative for chills and fever.  Respiratory:  Negative for shortness of breath.   Cardiovascular:  Negative  for chest pain and palpitations.  Gastrointestinal:  Negative for abdominal pain, blood in stool, constipation, diarrhea, nausea and vomiting.  Genitourinary:  Negative for dysuria, frequency, hematuria and urgency.  Skin:           Neurological:  Negative for headaches.  Psychiatric/Behavioral:  Positive for depression. The patient is nervous/anxious.        Objective:    Physical Exam Constitutional:      General: She is not in acute distress.    Appearance: Normal appearance. She is not ill-appearing.  HENT:     Head: Normocephalic and atraumatic.     Right Ear: External ear normal.     Left Ear: External ear normal.     Nose: Nose normal.     Mouth/Throat:     Mouth: Mucous membranes are moist.     Pharynx: Oropharynx is clear.  Eyes:     General:        Right eye: No discharge.        Left eye: No discharge.     Extraocular Movements: Extraocular movements intact.     Conjunctiva/sclera: Conjunctivae normal.     Pupils: Pupils are equal, round, and reactive to light.  Cardiovascular:     Rate and Rhythm: Normal rate and regular rhythm.     Pulses: Normal pulses.     Heart sounds: Normal heart sounds. No murmur heard.    No gallop.  Pulmonary:     Effort: Pulmonary effort is normal. No respiratory distress.     Breath sounds: Normal breath sounds.  No wheezing or rales.  Abdominal:     General: Bowel sounds are normal.     Palpations: Abdomen is soft.     Tenderness: There is no abdominal tenderness. There is no guarding.  Musculoskeletal:        General: Normal range of motion.     Cervical back: Normal range of motion.     Right lower leg: No edema.     Left lower leg: No edema.  Skin:    General: Skin is warm and dry.  Neurological:     Mental Status: She is alert and oriented to person, place, and time.  Psychiatric:        Mood and Affect: Mood normal.        Behavior: Behavior normal.        Judgment: Judgment normal.     BP 138/89 (BP Location: Right Arm, Patient Position: Sitting, Cuff Size: Normal)   Pulse 84   Temp 98 F (36.7 C) (Oral)   Resp 16   Ht 4\' 11"  (1.499 m)   Wt 155 lb (70.3 kg)   SpO2 98%   BMI 31.31 kg/m  Wt Readings from Last 3 Encounters:  08/05/22 155 lb (70.3 kg)  11/10/21 155 lb 12.8 oz (70.7 kg)  04/14/21 154 lb (69.9 kg)    Diabetic Foot Exam - Simple   No data filed    Lab Results  Component Value Date   WBC 7.1 08/05/2022   HGB 13.7 08/05/2022   HCT 40.3 08/05/2022   PLT 395.0 08/05/2022   GLUCOSE 92 08/05/2022   CHOL 170 08/05/2022   TRIG 85.0 08/05/2022   HDL 63.20 08/05/2022   LDLDIRECT 152.7 02/03/2012   LDLCALC 90 08/05/2022   ALT 17 08/05/2022   AST 21 08/05/2022   NA 138 08/05/2022   K 4.9 08/05/2022   CL 104 08/05/2022   CREATININE 0.75 08/05/2022   BUN 12  08/05/2022   CO2 27 08/05/2022   TSH 1.20 08/05/2022   HGBA1C 5.3 08/05/2022    Lab Results  Component Value Date   TSH 1.20 08/05/2022   Lab Results  Component Value Date   WBC 7.1 08/05/2022   HGB 13.7 08/05/2022   HCT 40.3 08/05/2022   MCV 88.2 08/05/2022   PLT 395.0 08/05/2022   Lab Results  Component Value Date   NA 138 08/05/2022   K 4.9 08/05/2022   CO2 27 08/05/2022   GLUCOSE 92 08/05/2022   BUN 12 08/05/2022   CREATININE 0.75 08/05/2022   BILITOT 0.6 08/05/2022   ALKPHOS 43  08/05/2022   AST 21 08/05/2022   ALT 17 08/05/2022   PROT 6.5 08/05/2022   ALBUMIN 4.3 08/05/2022   CALCIUM 9.5 08/05/2022   GFR 98.35 08/05/2022   Lab Results  Component Value Date   CHOL 170 08/05/2022   Lab Results  Component Value Date   HDL 63.20 08/05/2022   Lab Results  Component Value Date   LDLCALC 90 08/05/2022   Lab Results  Component Value Date   TRIG 85.0 08/05/2022   Lab Results  Component Value Date   CHOLHDL 3 08/05/2022   Lab Results  Component Value Date   HGBA1C 5.3 08/05/2022      Assessment & Plan:  Attention Deficit Disorder (ADD): This is controlled with Methylphenidate 10-20 mg.   Depression with Anxiety: Patient continues taking Bupropion 150 mg daily. Fluoxetine increased to 20 mg daily.   Family History of Breast Cancer: Referral placed to genetic counseling.  Hyperlipidemia: This is managed with Rosuvastatin 5 mg. Blood work results will show lipid levels.  Labs: Routine blood work ordered today. Problem List Items Addressed This Visit     Abnormal menses   Relevant Orders   CBC with Differential/Platelet   CBC with Differential/Platelet   Hx of thyroid disease   Relevant Orders   TSH   TSH   Preventative health care   Attention deficit disorder - Primary    Stable on current meds      Hyperlipidemia    Encourage heart healthy diet such as MIND or DASH diet, increase exercise, avoid trans fats, simple carbohydrates and processed foods, consider a krill or fish or flaxseed oil cap daily.        Relevant Orders   Comprehensive metabolic panel   Lipid panel   TSH   Comprehensive metabolic panel   Lipid panel   Depression with anxiety    On wellbutrin and Fluoxetine 10 mg daily and in counseling regularly. She notes she is doing better with managing her stress but still feels uneasy. She is considering increasing the Fluoxetine to 20 mg will send in to try. Spent 30 minutes discussing current state and plan for the future       Relevant Medications   FLUoxetine (PROZAC) 20 MG tablet   Other Visit Diagnoses     FH: breast cancer       Relevant Orders   Ambulatory referral to Hematology / Oncology   Hyperglycemia       Relevant Orders   Hemoglobin A1c   HgB A1c   High risk medication use       Relevant Orders   Drug Monitoring Panel [id number] , Urine      Meds ordered this encounter  Medications   FLUoxetine (PROZAC) 20 MG tablet    Sig: Take 1 tablet (20 mg total) by mouth daily.    Dispense:  90 tablet    Refill:  1   I, Danise Edge, MD, personally preformed the services described in this documentation.  All medical record entries made by the scribe were at my direction and in my presence.  I have reviewed the chart and discharge instructions (if applicable) and agree that the record reflects my personal performance and is accurate and complete. 08/05/2022  I,Mohammed Iqbal,acting as a scribe for Danise Edge, MD.,have documented all relevant documentation on the behalf of Danise Edge, MD,as directed by  Danise Edge, MD while in the presence of Danise Edge, MD.  Danise Edge, MD

## 2022-08-05 NOTE — Patient Instructions (Signed)

## 2022-08-07 LAB — DM TEMPLATE

## 2022-08-07 LAB — DRUG MONITORING PANEL 376104, URINE
Amphetamines: NEGATIVE ng/mL (ref <500)
Barbiturates: NEGATIVE ng/mL (ref <300)
Benzodiazepines: NEGATIVE ng/mL (ref <100)
Cocaine Metabolite: NEGATIVE ng/mL (ref <150)
Desmethyltramadol: NEGATIVE ng/mL (ref <100)
Opiates: NEGATIVE ng/mL (ref <100)
Oxycodone: NEGATIVE ng/mL (ref <100)
Tramadol: NEGATIVE ng/mL (ref <100)

## 2022-08-16 LAB — PROGESTERONE: Progesterone: <0.5 ng/mL

## 2022-08-16 LAB — ESTROGENS, TOTAL: Estrogen: 73 pg/mL

## 2022-08-17 ENCOUNTER — Encounter: Payer: Self-pay | Admitting: Family Medicine

## 2022-08-19 ENCOUNTER — Ambulatory Visit: Payer: 59 | Admitting: Behavioral Health

## 2022-08-24 ENCOUNTER — Other Ambulatory Visit: Payer: Self-pay | Admitting: Family Medicine

## 2022-08-24 ENCOUNTER — Other Ambulatory Visit (HOSPITAL_COMMUNITY): Payer: Self-pay

## 2022-08-24 MED ORDER — METHYLPHENIDATE HCL ER (LA) 20 MG PO CP24
20.0000 mg | ORAL_CAPSULE | ORAL | 0 refills | Status: DC
  Filled 2022-08-24: qty 30, 30d supply, fill #0

## 2022-08-25 ENCOUNTER — Other Ambulatory Visit (HOSPITAL_COMMUNITY): Payer: Self-pay

## 2022-08-31 ENCOUNTER — Other Ambulatory Visit: Payer: Self-pay

## 2022-09-02 ENCOUNTER — Other Ambulatory Visit (HOSPITAL_COMMUNITY): Payer: Self-pay

## 2022-09-15 ENCOUNTER — Other Ambulatory Visit (HOSPITAL_COMMUNITY): Payer: Self-pay

## 2022-09-27 ENCOUNTER — Other Ambulatory Visit (HOSPITAL_COMMUNITY): Payer: Self-pay

## 2022-09-27 ENCOUNTER — Other Ambulatory Visit: Payer: Self-pay | Admitting: Family Medicine

## 2022-09-27 MED ORDER — METHYLPHENIDATE HCL ER (LA) 20 MG PO CP24
20.0000 mg | ORAL_CAPSULE | ORAL | 0 refills | Status: DC
  Filled 2022-09-27: qty 30, 30d supply, fill #0

## 2022-09-27 NOTE — Telephone Encounter (Signed)
Requesting: methylphenidate 20mg  Contract: Yes UDS: 08/05/22 Last Visit: 08/05/2022 Next Visit: 11/18/2022 Last Refill: 08/24/22   Please Advise

## 2022-10-04 ENCOUNTER — Encounter: Payer: Self-pay | Admitting: Licensed Clinical Social Worker

## 2022-10-04 ENCOUNTER — Inpatient Hospital Stay: Payer: 59

## 2022-10-04 ENCOUNTER — Other Ambulatory Visit: Payer: Self-pay

## 2022-10-04 ENCOUNTER — Inpatient Hospital Stay: Payer: 59 | Attending: Genetic Counselor | Admitting: Licensed Clinical Social Worker

## 2022-10-04 DIAGNOSIS — Z801 Family history of malignant neoplasm of trachea, bronchus and lung: Secondary | ICD-10-CM

## 2022-10-04 DIAGNOSIS — Z803 Family history of malignant neoplasm of breast: Secondary | ICD-10-CM

## 2022-10-04 DIAGNOSIS — Z808 Family history of malignant neoplasm of other organs or systems: Secondary | ICD-10-CM

## 2022-10-04 LAB — GENETIC SCREENING ORDER

## 2022-10-04 NOTE — Progress Notes (Signed)
REFERRING PROVIDER: Bradd Canary, MD [address] [place],  [place] [address]  PRIMARY PROVIDER:  Bradd Canary, MD  PRIMARY REASON FOR VISIT:  1. Family history of breast cancer   2. Family history of thyroid cancer   3. Family history of lung cancer   4. Family history of skin cancer      HISTORY OF PRESENT ILLNESS:   [name], a 42 y.o. female, was seen for a [place] cancer genetics consultation at the request of Dr. Abner Greenspan due to a family history of breast cancer.  Stacy Dennis presents to clinic today to discuss the possibility of a hereditary predisposition to cancer, genetic testing, and to further clarify her future cancer risks, as well as potential cancer risks for family members.   CANCER HISTORY:  Stacy Dennis is a 42 y.o. female with no personal history of cancer.    RISK FACTORS:  Menarche was at age 64.  First live birth at age 21.  OCP use for approximately  15-20+  years.  Ovaries intact: yes.  Hysterectomy: no.  Menopausal status: premenopausal.  HRT use: 0 years. Colonoscopy: no; not examined. Mammogram within the last year: yes. Number of breast biopsies: 0. Up to date with pelvic exams: yes. Any excessive radiation exposure in the past: no  Past Medical History:  Diagnosis Date   Abnormal menses 06/17/2015   Allergic state 01/28/2012   Anxiety 07/06/2016   Dermatitis 06/17/2015   Endometriosis    H/O varicella    Hypercholesteremia    no meds   Hyperlipidemia 01/28/2012   Pregnancy related carpal tunnel syndrome, antepartum 2013   Preventative health care 01/28/2012   Verrucous skin lesion 01/28/2012    Past Surgical History:  Procedure Laterality Date   CESAREAN SECTION  10-10-11   CESAREAN SECTION N/A 12/10/2013   Procedure: CESAREAN SECTION;  Surgeon: Zelphia Cairo, MD;  Location: [facility];  Service: Obstetrics;  Laterality: N/A;  Repeat edc 9/27   EYE SURGERY  2012   lasik   MYRINGOTOMY      FAMILY HISTORY:  We  obtained a detailed, 4-generation family history.  Significant diagnoses are listed below: Family History  Problem Relation Age of Onset   Breast cancer Mother 59       L DCIS, lump, XRT   Anxiety disorder Mother    Hyperlipidemia Mother    Hypertension Mother    Irritable bowel syndrome Mother        ?   Kidney disease Father        stones   Hyperlipidemia Father    Breast cancer Maternal Aunt 60       triple negative receiving chemo now   Thyroid disease Maternal Aunt    Thyroid cancer Maternal Aunt        s/p thyroidectomy   Thyroid disease Paternal Aunt    Hypertension Maternal Grandmother    Anxiety disorder Maternal Grandmother    Heart disease Maternal Grandfather    Hyperlipidemia Paternal Grandmother    Dementia Paternal Grandmother    Other Paternal Grandmother        dementia   Skin cancer Paternal Grandmother    Lung cancer Paternal Grandfather        lung- smoker   Stroke Paternal Grandfather    Aneurysm Paternal Grandfather    Cerebral aneurysm Paternal Grandfather    Stacy Dennis has 2 sons, 11 and 8, no cancers. She has 1 sister, 42, no cancer.  Stacy Dennis mother had breast cancer (DCIS) at 76 and is living at 18, she has not had genetic testing. Maternal aunt was recently diagnosed with triple negative breast cancer at 17, unsure if she has had genetic testing. Another maternal aunt had thyroid cancer in her 69s.   Stacy Dennis father is living at 57. Paternal grandfather had lung cancer, he also had history of smoking, he passed at 31. Paternal grandmother is living at 65 with dementia, she has had multiple skin cancers (squamous cell, basal cell, keratoacanthoma).   Stacy Dennis is unaware of previous family history of genetic testing for hereditary cancer risks. There is no reported Ashkenazi Jewish ancestry. There is no known consanguinity.    GENETIC COUNSELING ASSESSMENT: Stacy Dennis is a 42 y.o. female with a family history of breast cancer  which is somewhat suggestive of a hereditary cancer syndrome and predisposition to cancer. We, therefore, discussed and recommended the following at today's visit.   DISCUSSION: We discussed that approximately 10% of breast cancer is hereditary. Most cases of hereditary breast cancer are associated with BRCA1/BRCA2 genes, although there are other genes associated with hereditary cancer as well. Cancers and risks are gene specific. We discussed that testing is beneficial for several reasons including knowing about cancer risks, identifying potential screening and risk-reduction options that may be appropriate, and to understand if other family members could be at risk for cancer and allow them to undergo genetic testing.   We reviewed the characteristics, features and inheritance patterns of hereditary cancer syndromes. We also discussed genetic testing, including the appropriate family members to test, the process of testing, insurance coverage and turn-around-time for results. We discussed the implications of a negative, positive and/or variant of uncertain significant result. We recommended Stacy Dennis pursue genetic testing for the [facility] CancerNext-Expanded+RNA gene panel.   Based on Stacy Dennis's family history of cancer, she meets medical criteria for genetic testing. Despite that she meets criteria, she may still have an out of pocket cost.   PLAN: After considering the risks, benefits, and limitations, Stacy Dennis provided informed consent to pursue genetic testing and the blood sample was sent to [facility] for analysis of the CancerNext-Expanded+RNA panel. Results should be available within approximately 2-3 weeks' time, at which point they will be disclosed by telephone to Ms. Poplaski, as will any additional recommendations warranted by these results. Stacy Dennis will receive a summary of her genetic counseling visit and a copy of her results once available. This information will also  be available in Epic.   Stacy Dennis questions were answered to her satisfaction today. Our contact information was provided should additional questions or concerns arise. Thank you for the referral and allowing [place] to share in the care of your patient.   Lacy Duverney, MS, [facility] Genetic Counselor [email or web address] Phone: [phone]  The patient was seen for a total of 60 minutes in face-to-face genetic counseling.  Dr. Blake Divine was available for discussion regarding this case.   _______________________________________________________________________ For Office Staff:  Number of people involved in session: 2 Was an Intern/ student involved with case: yes; [employer] intern Maddy Maisie Fus was also present and assisted with this case.

## 2022-10-18 ENCOUNTER — Encounter: Payer: Self-pay | Admitting: Licensed Clinical Social Worker

## 2022-10-18 ENCOUNTER — Ambulatory Visit: Payer: Self-pay | Admitting: Licensed Clinical Social Worker

## 2022-10-18 ENCOUNTER — Telehealth: Payer: Self-pay | Admitting: Licensed Clinical Social Worker

## 2022-10-18 DIAGNOSIS — Z1379 Encounter for other screening for genetic and chromosomal anomalies: Secondary | ICD-10-CM

## 2022-10-18 DIAGNOSIS — Z803 Family history of malignant neoplasm of breast: Secondary | ICD-10-CM

## 2022-10-18 NOTE — Progress Notes (Signed)
HPI:   Stacy Dennis was previously seen in the [facility] due to a Dennis history of cancer and concerns regarding a hereditary predisposition to cancer. Please refer to our prior cancer genetics Dennis note for more information regarding our discussion, assessment and recommendations, at the time. Stacy Dennis recent genetic test results were disclosed to her, as were recommendations warranted by these results. These results and recommendations are discussed in more detail below.  CANCER HISTORY:  Oncology History   No history exists.    Dennis HISTORY:  We obtained a detailed, 4-generation Dennis history.  Significant diagnoses are listed below: Dennis History  Problem Relation Age of Onset   Breast cancer Mother 58       L DCIS, lump, XRT   Anxiety disorder Mother    Hyperlipidemia Mother    Hypertension Mother    Irritable bowel syndrome Mother        ?   Kidney disease Father        stones   Hyperlipidemia Father    Breast cancer Maternal Aunt 60       triple negative receiving chemo now   Thyroid disease Maternal Aunt    Thyroid cancer Maternal Aunt        s/p thyroidectomy   Thyroid disease Paternal Aunt    Hypertension Maternal Grandmother    Anxiety disorder Maternal Grandmother    Heart disease Maternal Grandfather    Hyperlipidemia Paternal Grandmother    Dementia Paternal Grandmother    Other Paternal Grandmother        dementia   Skin cancer Paternal Grandmother    Lung cancer Paternal Grandfather        lung- smoker   Stroke Paternal Grandfather    Aneurysm Paternal Grandfather    Cerebral aneurysm Paternal Grandfather     [name] has 2 sons, 11 and 8, no cancers. She has 1 sister, 63, no cancer.   [name] mother had breast cancer (DCIS) at 20 and is living at 68, she has not had genetic testing. Maternal aunt was recently diagnosed with triple negative breast cancer at 24, unsure if she has had genetic testing. Another  maternal aunt had thyroid cancer in her 77s.    [name] father is living at 67. Paternal grandfather had lung cancer, he also had history of smoking, he passed at 12. Paternal grandmother is living at 76 with dementia, she has had multiple skin cancers (squamous cell, basal cell, keratoacanthoma).    [name] is unaware of previous Dennis history of genetic testing for hereditary cancer risks. There is no reported Ashkenazi Jewish ancestry. There is no known consanguinity.     GENETIC TEST RESULTS:  The [facility] CancerNext-Expanded+RNA Panel found no pathogenic mutations.   The CancerNext-Expanded + RNAinsight gene panel offered by [employer] and includes sequencing and rearrangement analysis for the following 71 genes: AIP, ALK, APC*, ATM*, AXIN2, BAP1, BARD1, BMPR1A, BRCA1*, BRCA2*, BRIP1*, CDC73, CDH1*,CDK4, CDKN1B, CDKN2A, CHEK2*, CTNNA1, DICER1, FH, FLCN, KIF1B, LZTR1, MAX, MEN1, MET, MLH1*, MSH2*, MSH3, MSH6*, MUTYH*, NF1*, NF2, NTHL1, PALB2*, PHOX2B, PMS2*, POT1, PRKAR1A, PTCH1, PTEN*, RAD51C*, RAD51D*,RB1, RET, SDHA, SDHAF2, SDHB, SDHC, SDHD, SMAD4, SMARCA4, SMARCB1, SMARCE1, STK11, SUFU, TMEM127, TP53*,TSC1, TSC2, VHL; EGFR, EGLN1, HOXB13, KIT, MITF, PDGFRA, POLD1 and POLE (sequencing only); EPCAM and GREM1 (deletion/duplication only).   The test report has been scanned into EPIC and is located under the Molecular Pathology section of the Results Review tab.  A portion of the result  report is included below for reference. Genetic testing reported out on 10/14/2022.      Genetic testing identified a variant of uncertain significance (VUS) in the ATM gene.  At this time, it is unknown if this variant is associated with an increased risk for cancer or if it is benign, but most uncertain variants are reclassified to benign. It should not be used to make medical management decisions. With time, we suspect the laboratory will determine the significance of this variant, if any. If the  laboratory reclassifies this variant, we will attempt to contact Stacy Dennis to discuss it further.   Even though a pathogenic variant was not identified, possible explanations for the cancer in the Dennis may include: There may be no hereditary risk for cancer in the Dennis. The cancers in Stacy Dennis may be sporadic/familial or due to other genetic and environmental factors. There may be a gene mutation in one of these genes that current testing methods cannot detect but that chance is small. There could be another gene that has not yet been discovered, or that we have not yet tested, that is responsible for the cancer diagnoses in the Dennis.  It is also possible there is a hereditary cause for the cancer in the Dennis that Stacy Dennis did not inherit. The variant of uncertain significance detected in the ATM gene may be reclassified as a pathogenic variant in the future. At this time, we do not know if this variant increases the risk for cancer.  Therefore, it is important to remain in touch with cancer genetics in the future so that we can continue to offer Stacy Dennis the most up to date genetic testing.   ADDITIONAL GENETIC TESTING:  We discussed with Stacy Dennis that her genetic testing was fairly extensive.  If there are additional relevant genes identified to increase cancer risk that can be analyzed in the future, we would be happy to discuss and coordinate this testing at that time.    CANCER SCREENING RECOMMENDATIONS:  Stacy Dennis test result is considered negative (normal).  This means that we have not identified a hereditary cause for her Dennis history of cancer at this time.   An individual's cancer risk and medical management are not determined by genetic test results alone. Overall cancer risk assessment incorporates additional factors, including personal medical history, Dennis history, and any available genetic information that may result in a  personalized plan for cancer prevention and surveillance. Therefore, it is recommended she continue to follow the cancer management and screening guidelines provided by her primary healthcare provider.  Based on the reported personal and Dennis history, specific cancer screenings for Stacy Dennis include:   Breast Cancer Screening:  The Dennis model is one of multiple prediction models developed to estimate an individual's lifetime risk of developing breast cancer. The Dennis model is endorsed by the [employer] (NCCN). This model includes many risk factors such as Dennis history, endogenous estrogen exposure, and benign breast disease. The calculation is highly-dependent on the accuracy of clinical data provided by the patient and can change over time. The Dennis model may be repeated to reflect new information in her personal or Dennis history in the future.    Stacy Dennis risk score is 25.9%.  For women with a greater than 20% lifetime risk of breast cancer, the NCCN recommends the following:    1.   Clinical encounter every 6-12 months to begin  when identified as being at increased risk, but not before age 8    2.   Annual mammograms, tomosynthesis is recommended starting 10 years earlier than the youngest breast cancer diagnosis in the Dennis or at age 23 (whichever comes first), but not before age 11     52.   Annual breast MRI starting 10 years earlier than the youngest breast cancer diagnosis in the Dennis or at age 70 (whichever comes first), but not before age 27   We have placed a referral for Stacy Dennis.     RECOMMENDATIONS FOR Dennis MEMBERS:   Since she did not inherit a identifiable mutation in a cancer predisposition gene included on this panel, her children could not have inherited a known mutation from her in one of these genes. Individuals in this Dennis might be at some  increased risk of developing cancer, over the general population risk, due to the Dennis history of cancer.  Individuals in the Dennis should notify their providers of the Dennis history of cancer. We recommend women in this Dennis have a yearly mammogram beginning at age 74, or 86 years younger than the earliest onset of cancer, an annual clinical breast exam, and perform monthly breast self-exams.  Dennis members should have colonoscopies by at age 57, or earlier, as recommended by their providers. Other members of the Dennis may still carry a pathogenic variant in one of these genes that Ms. Melecio did not inherit. Based on the Dennis history, we recommend her mother/aunt who were diagnosed with breast cancer have genetic counseling and testing. Ms. Urdaneta will let us know if we can be of any assistance in coordinating genetic counseling and/or testing for this Dennis member.   We do not recommend familial testing for the ATM variant of uncertain significance (VUS).  FOLLOW-UP:  Lastly, we discussed with Ms. Talamantez that cancer genetics is a rapidly advancing field and it is possible that new genetic tests will be appropriate for her and/or her Dennis members in the future. We encouraged her to remain in contact with cancer genetics on an annual basis so we can update her personal and Dennis histories and let her know of advances in cancer genetics that may benefit this Dennis.   Our contact number was provided. Ms. Horman questions were answered to her satisfaction, and she knows she is welcome to call us at anytime with additional questions or concerns.    Lacy Duverney, MS, [facility] Genetic Counselor [email or web address] Phone: [phone]

## 2022-10-18 NOTE — Telephone Encounter (Signed)
I contacted Ms. Frisina to discuss her genetic testing results. No pathogenic variants were identified in the 71 genes analyzed. Detailed clinic note to follow.   The test report has been scanned into EPIC and is located under the Molecular Pathology section of the Results Review tab.  A portion of the result report is included below for reference.      Lacy Duverney, MS, [facility] Genetic Counselor [email or web address] Phone: [phone]

## 2022-10-20 ENCOUNTER — Encounter (INDEPENDENT_AMBULATORY_CARE_PROVIDER_SITE_OTHER): Payer: Self-pay

## 2022-10-24 ENCOUNTER — Other Ambulatory Visit: Payer: Self-pay | Admitting: Family Medicine

## 2022-10-25 ENCOUNTER — Other Ambulatory Visit (HOSPITAL_COMMUNITY): Payer: Self-pay

## 2022-10-25 MED ORDER — METHYLPHENIDATE HCL ER (LA) 20 MG PO CP24
20.0000 mg | ORAL_CAPSULE | ORAL | 0 refills | Status: DC
Start: 2022-10-25 — End: 2022-11-22
  Filled 2022-10-25: qty 30, 30d supply, fill #0

## 2022-11-10 ENCOUNTER — Telehealth: Payer: Self-pay | Admitting: Family

## 2022-11-10 ENCOUNTER — Ambulatory Visit: Payer: 59 | Admitting: Family

## 2022-11-10 ENCOUNTER — Other Ambulatory Visit (INDEPENDENT_AMBULATORY_CARE_PROVIDER_SITE_OTHER): Payer: 59

## 2022-11-10 VITALS — BP 170/92 | HR 106 | Temp 99.2°F | Resp 16 | Wt 158.0 lb

## 2022-11-10 DIAGNOSIS — J069 Acute upper respiratory infection, unspecified: Secondary | ICD-10-CM | POA: Diagnosis not present

## 2022-11-10 DIAGNOSIS — E785 Hyperlipidemia, unspecified: Secondary | ICD-10-CM

## 2022-11-10 DIAGNOSIS — R030 Elevated blood-pressure reading, without diagnosis of hypertension: Secondary | ICD-10-CM | POA: Insufficient documentation

## 2022-11-10 DIAGNOSIS — R739 Hyperglycemia, unspecified: Secondary | ICD-10-CM

## 2022-11-10 DIAGNOSIS — N926 Irregular menstruation, unspecified: Secondary | ICD-10-CM | POA: Diagnosis not present

## 2022-11-10 DIAGNOSIS — Z8639 Personal history of other endocrine, nutritional and metabolic disease: Secondary | ICD-10-CM

## 2022-11-10 LAB — POCT INFLUENZA A/B
Influenza A, POC: NEGATIVE
Influenza B, POC: NEGATIVE

## 2022-11-10 LAB — POC COVID19 BINAXNOW: SARS Coronavirus 2 Ag: NEGATIVE

## 2022-11-10 NOTE — Assessment & Plan Note (Signed)
BP Readings from Last 3 Encounters:  11/10/22 (!) 170/92  08/05/22 138/89  11/10/21 136/84    New.  Will plan to bring patient back in 1 week for repeat bp check with nurse. If still elevated would consider antihypertensive rx.

## 2022-11-10 NOTE — Progress Notes (Signed)
Subjective:     Patient ID: Stacy Dennis, female    DOB: May 19, 1980, 42 y.o.   MRN: [id number]  Chief Complaint  Patient presents with   Cough    Patient complains of cough with chest thightness    Cough    Discussed the use of AI scribe software for clinical note transcription with the patient, who gave verbal consent to proceed.  History of Present Illness   The patient, a physical therapist, presents with a 3-day history of chest pain described as a burning sensation and chest tightness. The symptoms are associated with body aches and headaches that only occur in the afternoon. The patient also reports experiencing chills. The patient's son had similar symptoms, including a headache, cough, and body aches, which started on August 4th. The son's symptoms were initially attributed to dehydration The son's symptoms progressed to include a persistent cough and fever in the afternoons and evenings and he was diagnosed with "walking pneumonia."  The patient has not previously been tested for COVID-19 since she developed symptoms.          Health Maintenance Due  Topic Date Due   Hepatitis C Screening  Never done   DTaP/Tdap/Td (2 - Td or Tdap) 03/22/2021   COVID-19 Vaccine (4 - 2023-24 season) 11/20/2021   INFLUENZA VACCINE  10/21/2022    Past Medical History:  Diagnosis Date   Abnormal menses 06/17/2015   Allergic state 01/28/2012   Anxiety 07/06/2016   Dermatitis 06/17/2015   Endometriosis    H/O varicella    Hypercholesteremia    no meds   Hyperlipidemia 01/28/2012   Pregnancy related carpal tunnel syndrome, antepartum 2013   Preventative health care 01/28/2012   Verrucous skin lesion 01/28/2012    Past Surgical History:  Procedure Laterality Date   CESAREAN SECTION  10-10-11   CESAREAN SECTION N/A 12/10/2013   Procedure: CESAREAN SECTION;  Surgeon: Zelphia Cairo, MD;  Location: [facility];  Service: Obstetrics;  Laterality: N/A;  Repeat edc 9/27   EYE SURGERY  2012    lasik   MYRINGOTOMY      Family History  Problem Relation Age of Onset   Breast cancer Mother 29       L DCIS, lump, XRT   Anxiety disorder Mother    Hyperlipidemia Mother    Hypertension Mother    Irritable bowel syndrome Mother        ?   Kidney disease Father        stones   Hyperlipidemia Father    Breast cancer Maternal Aunt 60       triple negative receiving chemo now   Thyroid disease Maternal Aunt    Thyroid cancer Maternal Aunt        s/p thyroidectomy   Thyroid disease Paternal Aunt    Hypertension Maternal Grandmother    Anxiety disorder Maternal Grandmother    Heart disease Maternal Grandfather    Hyperlipidemia Paternal Grandmother    Dementia Paternal Grandmother    Other Paternal Grandmother        dementia   Skin cancer Paternal Grandmother    Lung cancer Paternal Grandfather        lung- smoker   Stroke Paternal Grandfather    Aneurysm Paternal Grandfather    Cerebral aneurysm Paternal Grandfather     Social History   Socioeconomic History   Marital status: Married    Spouse name: Not on file   Number of children: Not on file  Years of education: Not on file   Highest education level: Not on file  Occupational History   Not on file  Tobacco Use   Smoking status: Never   Smokeless tobacco: Never  Substance and Sexual Activity   Alcohol use: Yes    Comment: socially when not pregnant   Drug use: No   Sexual activity: Yes    Partners: Male    Birth control/protection: None  Other Topics Concern   Not on file  Social History Narrative   Not on file   Social Determinants of Health   Financial Resource Strain: Not on file  Food Insecurity: Not on file  Transportation Needs: Not on file  Physical Activity: Not on file  Stress: Not on file  Social Connections: Not on file  Intimate Partner Violence: Not on file    Outpatient Medications Prior to Visit  Medication Sig Dispense Refill   buPROPion (WELLBUTRIN XL) 150 MG 24 hr tablet  Take 1 tablet (150 mg total) by mouth daily. 90 tablet 1   FLUoxetine (PROZAC) 20 MG tablet Take 1 tablet (20 mg total) by mouth daily. 90 tablet 1   methylphenidate (RITALIN LA) 20 MG 24 hr capsule Take 1 capsule (20 mg total) by mouth every morning. 30 capsule 0   Norethindrone Acetate-Ethinyl Estrad-FE (BLISOVI 24 FE) 1-20 MG-MCG(24) tablet Take 1 tablet by mouth daily. 84 tablet 3   rosuvastatin (CRESTOR) 5 MG tablet Take 1 tablet (5 mg total) by mouth daily. 90 tablet 3   methylphenidate (RITALIN LA) 20 MG 24 hr capsule Take 1 capsule (20 mg total) by mouth every morning. March 2024 rx 30 capsule 0   methylphenidate (RITALIN LA) 20 MG 24 hr capsule Take 1 capsule (20 mg total) by mouth every morning. 30 capsule 0   methylphenidate (RITALIN) 10 MG tablet Take 1 tablet (10 mg total) by mouth daily as needed. January 2024 rx 30 tablet 0   methylphenidate (RITALIN) 10 MG tablet Take 1 tablet (10 mg total) by mouth daily as needed. February 2024 rx 30 tablet 0   methylphenidate (RITALIN) 10 MG tablet Take 1 tablet (10 mg total) by mouth daily as needed. March 2024 rx 30 tablet 0   No facility-administered medications prior to visit.    Allergies  Allergen Reactions   Amoxicillin Nausea And Vomiting   Lactose Intolerance (Gi)     Review of Systems  Respiratory:  Positive for cough.        Objective:    Physical Exam Constitutional:      General: She is not in acute distress.    Appearance: Normal appearance. She is well-developed.  HENT:     Head: Normocephalic and atraumatic.     Right Ear: Tympanic membrane and external ear normal.     Left Ear: Tympanic membrane and external ear normal.     Mouth/Throat:     Mouth: Mucous membranes are moist.     Pharynx: Oropharynx is clear. Posterior oropharyngeal erythema (mild) present.     Tonsils: No tonsillar exudate or tonsillar abscesses.  Eyes:     General: No scleral icterus. Neck:     Thyroid: No thyromegaly.  Cardiovascular:      Rate and Rhythm: Normal rate and regular rhythm.     Heart sounds: Normal heart sounds. No murmur heard. Pulmonary:     Effort: Pulmonary effort is normal. No respiratory distress.     Breath sounds: Normal breath sounds. No wheezing.  Musculoskeletal:  Cervical back: Neck supple.  Skin:    General: Skin is warm and dry.  Neurological:     Mental Status: She is alert and oriented to person, place, and time.  Psychiatric:        Mood and Affect: Mood normal.        Behavior: Behavior normal.        Thought Content: Thought content normal.        Judgment: Judgment normal.     BP (!) 170/92 (BP Location: Right Arm, Patient Position: Sitting, Cuff Size: Small)   Pulse (!) 106   Temp 99.2 F (37.3 C) (Oral)   Resp 16   Wt 158 lb (71.7 kg)   SpO2 97%   BMI 31.91 kg/m  Wt Readings from Last 3 Encounters:  11/10/22 158 lb (71.7 kg)  08/05/22 155 lb (70.3 kg)  11/10/21 155 lb 12.8 oz (70.7 kg)       Assessment & Plan:   Problem List Items Addressed This Visit       Unprioritized   Viral URI with cough - Primary    Rapid Covid and Rapid flu testing are both negative.  Discussed typical course of viral illness.   Recommended supportive measures. Can alternate tylenol with motrin, hydrate, rest.  Pt to reach out to [place] if new/worsening symptoms of if symptoms do not improve in the next 3-4 days.       Relevant Orders   POCT Influenza A/B (Completed)   POC COVID-19 (Completed)   Elevated blood pressure reading    BP Readings from Last 3 Encounters:  11/10/22 (!) 170/92  08/05/22 138/89  11/10/21 136/84    New.  Will plan to bring patient back in 1 week for repeat bp check with nurse. If still elevated would consider antihypertensive rx.        I am having Stacy Dennis maintain her Blisovi 24 Fe, buPROPion, rosuvastatin, FLUoxetine, and methylphenidate.  No orders of the defined types were placed in this encounter.

## 2022-11-10 NOTE — Telephone Encounter (Signed)
I did not address her elevated blood pressure today at her sick visit. Let's have her return in 1 week for a nurse visit BP recheck.

## 2022-11-10 NOTE — Assessment & Plan Note (Signed)
Rapid Covid and Rapid flu testing are both negative.  Discussed typical course of viral illness.   Recommended supportive measures. Can alternate tylenol with motrin, hydrate, rest.  Pt to reach out to [place] if new/worsening symptoms of if symptoms do not improve in the next 3-4 days.

## 2022-11-11 ENCOUNTER — Encounter: Payer: Self-pay | Admitting: Family

## 2022-11-11 ENCOUNTER — Other Ambulatory Visit: Payer: 59

## 2022-11-11 ENCOUNTER — Ambulatory Visit: Payer: 59 | Admitting: Family Medicine

## 2022-11-11 LAB — COMPREHENSIVE METABOLIC PANEL
ALT: 11 U/L (ref 0–35)
AST: 15 U/L (ref 0–37)
Albumin: 4.1 g/dL (ref 3.5–5.2)
Alkaline Phosphatase: 49 U/L (ref 39–117)
BUN: 10 mg/dL (ref 6–23)
CO2: 27 meq/L (ref 19–32)
Calcium: 9.1 mg/dL (ref 8.4–10.5)
Chloride: 100 meq/L (ref 96–112)
Creatinine, Ser: 0.74 mg/dL (ref 0.40–1.20)
GFR: 99.76 mL/min (ref >60.00)
Glucose, Bld: 99 mg/dL (ref 70–99)
Potassium: 3.8 mEq/L (ref 3.5–5.1)
Sodium: 136 meq/L (ref 135–145)
Total Bilirubin: 0.3 mg/dL (ref 0.2–1.2)
Total Protein: 6.6 g/dL (ref 6.0–8.3)

## 2022-11-11 LAB — CBC WITH DIFFERENTIAL/PLATELET
Basophils Absolute: 0 10*3/uL (ref 0.0–0.1)
Basophils Relative: 0.2 % (ref 0.0–3.0)
Eosinophils Absolute: 0.1 10*3/uL (ref 0.0–0.7)
Eosinophils Relative: 0.9 % (ref 0.0–5.0)
HCT: 38.2 % (ref 36.0–46.0)
Hemoglobin: 12.7 g/dL (ref 12.0–15.0)
Lymphocytes Relative: 10.9 % — ABNORMAL LOW (ref 12.0–46.0)
Lymphs Abs: 1 10*3/uL (ref 0.7–4.0)
MCHC: 33.3 g/dL (ref 30.0–36.0)
MCV: 88.4 fl (ref 78.0–100.0)
Monocytes Absolute: 0.6 10*3/uL (ref 0.1–1.0)
Monocytes Relative: 6.2 % (ref 3.0–12.0)
Neutro Abs: 7.6 10*3/uL (ref 1.4–7.7)
Neutrophils Relative %: 81.8 % — ABNORMAL HIGH (ref 43.0–77.0)
Platelets: 344 10*3/uL (ref 150.0–400.0)
RBC: 4.32 Mil/uL (ref 3.87–5.11)
RDW: 12.5 % (ref 11.5–15.5)
WBC: 9.3 10*3/uL (ref 4.0–10.5)

## 2022-11-11 LAB — LIPID PANEL
Cholesterol: 178 mg/dL (ref 0–200)
HDL: 60.3 mg/dL (ref >39.00)
LDL Cholesterol: 91 mg/dL (ref 0–99)
NonHDL: 117.67
Total CHOL/HDL Ratio: 3
Triglycerides: 135 mg/dL (ref 0.0–149.0)
VLDL: 27 mg/dL (ref 0.0–40.0)

## 2022-11-11 LAB — HEMOGLOBIN A1C: Hgb A1c MFr Bld: 5.5 % (ref 4.6–6.5)

## 2022-11-11 NOTE — Telephone Encounter (Signed)
/  Patient is scheduled to see Dr. Abner Greenspan 11/18/22

## 2022-11-12 LAB — TSH: TSH: 1.11 u[IU]/mL (ref 0.35–5.50)

## 2022-11-17 NOTE — Assessment & Plan Note (Signed)
Stable on current meds, no changes. 

## 2022-11-17 NOTE — Assessment & Plan Note (Signed)
Patient encouraged to maintain heart healthy diet, regular exercise, adequate sleep. Consider daily probiotics. Take medications as prescribed. Labs ordered and reviewed. Pap and MGM with OB/GYN will request most recent

## 2022-11-17 NOTE — Assessment & Plan Note (Signed)
On Bupropion and Fluoxetine

## 2022-11-17 NOTE — Assessment & Plan Note (Signed)
Tolerating statin, encouraged heart healthy diet, avoid trans fats, minimize simple carbs and saturated fats. Increase exercise as tolerated 

## 2022-11-18 ENCOUNTER — Inpatient Hospital Stay: Payer: 59

## 2022-11-18 ENCOUNTER — Encounter: Payer: Self-pay | Admitting: Family Medicine

## 2022-11-18 ENCOUNTER — Inpatient Hospital Stay: Payer: 59 | Attending: Genetic Counselor | Admitting: Hematology and Oncology

## 2022-11-18 ENCOUNTER — Ambulatory Visit (INDEPENDENT_AMBULATORY_CARE_PROVIDER_SITE_OTHER): Payer: 59 | Admitting: Family Medicine

## 2022-11-18 VITALS — BP 139/97 | HR 93 | Temp 98.7°F | Resp 18 | Wt 151.8 lb

## 2022-11-18 VITALS — BP 134/86 | HR 92 | Temp 98.0°F | Resp 16 | Ht 59.0 in | Wt 152.0 lb

## 2022-11-18 DIAGNOSIS — R9230 Dense breasts, unspecified: Secondary | ICD-10-CM

## 2022-11-18 DIAGNOSIS — Z7183 Encounter for nonprocreative genetic counseling: Secondary | ICD-10-CM | POA: Insufficient documentation

## 2022-11-18 DIAGNOSIS — Z803 Family history of malignant neoplasm of breast: Secondary | ICD-10-CM

## 2022-11-18 DIAGNOSIS — E785 Hyperlipidemia, unspecified: Secondary | ICD-10-CM | POA: Diagnosis not present

## 2022-11-18 DIAGNOSIS — F418 Other specified anxiety disorders: Secondary | ICD-10-CM

## 2022-11-18 DIAGNOSIS — Z0000 Encounter for general adult medical examination without abnormal findings: Secondary | ICD-10-CM | POA: Diagnosis not present

## 2022-11-18 DIAGNOSIS — Z9189 Other specified personal risk factors, not elsewhere classified: Secondary | ICD-10-CM

## 2022-11-18 DIAGNOSIS — R030 Elevated blood-pressure reading, without diagnosis of hypertension: Secondary | ICD-10-CM

## 2022-11-18 DIAGNOSIS — Z801 Family history of malignant neoplasm of trachea, bronchus and lung: Secondary | ICD-10-CM | POA: Diagnosis not present

## 2022-11-18 DIAGNOSIS — J069 Acute upper respiratory infection, unspecified: Secondary | ICD-10-CM

## 2022-11-18 DIAGNOSIS — F988 Other specified behavioral and emotional disorders with onset usually occurring in childhood and adolescence: Secondary | ICD-10-CM | POA: Diagnosis not present

## 2022-11-18 NOTE — Progress Notes (Signed)
.  Subjective:    Patient ID: Stacy Dennis, female    DOB: 1980/10/06, 42 y.o.   MRN: [id number]  Chief Complaint  Patient presents with   Annual Exam    Annual Exam     HPI Discussed the use of AI scribe software for clinical note transcription with the patient, who gave verbal consent to proceed.  History of Present Illness   The patient, with a family history of breast cancer, presents with concerns about her own risk factors. She has dense breast tissue and is currently undergoing genetic counseling and testing. Her blood work came back negative for known genetic abnormalities, but she still has a 25% risk due to other factors. She reports no new health issues since her last visit.  The patient also reports increased stress and sleep disturbances, including vivid dreams. She is currently on Wellbutrin and fluoxetine, which she believes may be contributing to her sleep issues. She also mentions a recent illness with symptoms similar to a respiratory infection, which has since resolved.  The patient has a history of hypertension, with blood pressure readings trending higher over the past year. She has not been monitoring her blood pressure at home and acknowledges that her current stress levels may be contributing to these higher readings.        Past Medical History:  Diagnosis Date   Abnormal menses 06/17/2015   Allergic state 01/28/2012   Anxiety 07/06/2016   Dermatitis 06/17/2015   Endometriosis    H/O varicella    Hypercholesteremia    no meds   Hyperlipidemia 01/28/2012   Pregnancy related carpal tunnel syndrome, antepartum 2013   Preventative health care 01/28/2012   Verrucous skin lesion 01/28/2012    Past Surgical History:  Procedure Laterality Date   CESAREAN SECTION  10-10-11   CESAREAN SECTION N/A 12/10/2013   Procedure: CESAREAN SECTION;  Surgeon: Zelphia Cairo, MD;  Location: [facility];  Service: Obstetrics;  Laterality: N/A;  Repeat edc 9/27   EYE SURGERY   2012   lasik   MYRINGOTOMY      Family History  Problem Relation Age of Onset   Cancer Mother    Breast cancer Mother 84       L DCIS, lump, XRT   Anxiety disorder Mother    Hyperlipidemia Mother    Hypertension Mother    Irritable bowel syndrome Mother        ?   Kidney disease Father        stones   Hyperlipidemia Father    Birth defects Maternal Aunt    Breast cancer Maternal Aunt 6       triple negative receiving chemo now   Thyroid disease Maternal Aunt    Thyroid cancer Maternal Aunt        s/p thyroidectomy   Thyroid disease Paternal Aunt    Hypertension Maternal Grandmother    Anxiety disorder Maternal Grandmother    Heart disease Maternal Grandfather    Hyperlipidemia Paternal Grandmother    Dementia Paternal Grandmother    Other Paternal Grandmother        dementia   Skin cancer Paternal Grandmother    Lung cancer Paternal Grandfather        lung- smoker   Stroke Paternal Grandfather    Aneurysm Paternal Grandfather    Cerebral aneurysm Paternal Grandfather     Social History   Socioeconomic History   Marital status: Married    Spouse name: Not on file   Number  of children: Not on file   Years of education: Not on file   Highest education level: Not on file  Occupational History   Not on file  Tobacco Use   Smoking status: Never   Smokeless tobacco: Never  Substance and Sexual Activity   Alcohol use: Yes    Comment: socially when not pregnant   Drug use: No   Sexual activity: Yes    Partners: Male    Birth control/protection: None  Other Topics Concern   Not on file  Social History Narrative   Not on file   Social Determinants of Health   Financial Resource Strain: Not on file  Food Insecurity: Not on file  Transportation Needs: Not on file  Physical Activity: Not on file  Stress: Not on file  Social Connections: Not on file  Intimate Partner Violence: Not on file    Outpatient Medications Prior to Visit  Medication Sig Dispense  Refill   buPROPion (WELLBUTRIN XL) 150 MG 24 hr tablet Take 1 tablet (150 mg total) by mouth daily. 90 tablet 1   FLUoxetine (PROZAC) 20 MG tablet Take 1 tablet (20 mg total) by mouth daily. 90 tablet 1   methylphenidate (RITALIN LA) 20 MG 24 hr capsule Take 1 capsule (20 mg total) by mouth every morning. 30 capsule 0   Norethindrone Acetate-Ethinyl Estrad-FE (BLISOVI 24 FE) 1-20 MG-MCG(24) tablet Take 1 tablet by mouth daily. 84 tablet 3   rosuvastatin (CRESTOR) 5 MG tablet Take 1 tablet (5 mg total) by mouth daily. 90 tablet 3   No facility-administered medications prior to visit.    Allergies  Allergen Reactions   Amoxicillin Nausea And Vomiting   Lactose Intolerance (Gi)     Review of Systems  Constitutional:  Positive for malaise/fatigue. Negative for chills and fever.  HENT:  Negative for congestion and hearing loss.   Eyes:  Negative for discharge.  Respiratory:  Positive for cough. Negative for sputum production and shortness of breath.   Cardiovascular:  Negative for chest pain, palpitations and leg swelling.  Gastrointestinal:  Negative for abdominal pain, blood in stool, constipation, diarrhea, heartburn, nausea and vomiting.  Genitourinary:  Negative for dysuria, frequency, hematuria and urgency.  Musculoskeletal:  Negative for back pain, falls and myalgias.  Skin:  Negative for rash.  Neurological:  Negative for dizziness, sensory change, loss of consciousness, weakness and headaches.  Endo/Heme/Allergies:  Negative for environmental allergies. Does not bruise/bleed easily.  Psychiatric/Behavioral:  Negative for depression and suicidal ideas. The patient is nervous/anxious. The patient does not have insomnia.        Objective:    Physical Exam Constitutional:      General: She is not in acute distress.    Appearance: Normal appearance. She is not diaphoretic.  HENT:     Head: Normocephalic and atraumatic.     Right Ear: Tympanic membrane, ear canal and external  ear normal.     Left Ear: Tympanic membrane, ear canal and external ear normal.     Nose: Nose normal.     Mouth/Throat:     Mouth: Mucous membranes are moist.     Pharynx: Oropharynx is clear. No oropharyngeal exudate.  Eyes:     General: No scleral icterus.       Right eye: No discharge.        Left eye: No discharge.     Conjunctiva/sclera: Conjunctivae normal.     Pupils: Pupils are equal, round, and reactive to light.  Neck:  Thyroid: No thyromegaly.  Cardiovascular:     Rate and Rhythm: Normal rate and regular rhythm.     Heart sounds: Normal heart sounds. No murmur heard. Pulmonary:     Effort: Pulmonary effort is normal. No respiratory distress.     Breath sounds: Normal breath sounds. No wheezing or rales.  Abdominal:     General: Bowel sounds are normal. There is no distension.     Palpations: Abdomen is soft. There is no mass.     Tenderness: There is no abdominal tenderness.  Musculoskeletal:        General: No tenderness. Normal range of motion.     Cervical back: Normal range of motion and neck supple.  Lymphadenopathy:     Cervical: No cervical adenopathy.  Skin:    General: Skin is warm and dry.     Findings: No rash.  Neurological:     General: No focal deficit present.     Mental Status: She is alert and oriented to person, place, and time.     Cranial Nerves: No cranial nerve deficit.     Coordination: Coordination normal.     Deep Tendon Reflexes: Reflexes are normal and symmetric. Reflexes normal.  Psychiatric:        Mood and Affect: Mood normal.        Behavior: Behavior normal.        Thought Content: Thought content normal.        Judgment: Judgment normal.     BP 134/86 (BP Location: Left Arm, Patient Position: Sitting)   Pulse 92   Temp 98 F (36.7 C) (Oral)   Resp 16   Ht 4\' 11"  (1.499 m)   Wt 152 lb (68.9 kg)   SpO2 97%   BMI 30.70 kg/m  Wt Readings from Last 3 Encounters:  11/18/22 151 lb 12.8 oz (68.9 kg)  11/18/22 152 lb  (68.9 kg)  11/10/22 158 lb (71.7 kg)    Diabetic Foot Exam - Simple   No data filed    Lab Results  Component Value Date   WBC 9.3 11/10/2022   HGB 12.7 11/10/2022   HCT 38.2 11/10/2022   PLT 344.0 11/10/2022   GLUCOSE 99 11/10/2022   CHOL 178 11/10/2022   TRIG 135.0 11/10/2022   HDL 60.30 11/10/2022   LDLDIRECT 152.7 02/03/2012   LDLCALC 91 11/10/2022   ALT 11 11/10/2022   AST 15 11/10/2022   NA 136 11/10/2022   K 3.8 11/10/2022   CL 100 11/10/2022   CREATININE 0.74 11/10/2022   BUN 10 11/10/2022   CO2 27 11/10/2022   TSH 1.11 11/10/2022   HGBA1C 5.5 11/10/2022    Lab Results  Component Value Date   TSH 1.11 11/10/2022   Lab Results  Component Value Date   WBC 9.3 11/10/2022   HGB 12.7 11/10/2022   HCT 38.2 11/10/2022   MCV 88.4 11/10/2022   PLT 344.0 11/10/2022   Lab Results  Component Value Date   NA 136 11/10/2022   K 3.8 11/10/2022   CO2 27 11/10/2022   GLUCOSE 99 11/10/2022   BUN 10 11/10/2022   CREATININE 0.74 11/10/2022   BILITOT 0.3 11/10/2022   ALKPHOS 49 11/10/2022   AST 15 11/10/2022   ALT 11 11/10/2022   PROT 6.6 11/10/2022   ALBUMIN 4.1 11/10/2022   CALCIUM 9.1 11/10/2022   GFR 99.76 11/10/2022   Lab Results  Component Value Date   CHOL 178 11/10/2022   Lab Results  Component Value  Date   HDL 60.30 11/10/2022   Lab Results  Component Value Date   LDLCALC 91 11/10/2022   Lab Results  Component Value Date   TRIG 135.0 11/10/2022   Lab Results  Component Value Date   CHOLHDL 3 11/10/2022   Lab Results  Component Value Date   HGBA1C 5.5 11/10/2022       Assessment & Plan:  Attention deficit disorder, unspecified hyperactivity presence Assessment & Plan: Stable on current meds, no changes   Hyperlipidemia, unspecified hyperlipidemia type Assessment & Plan: Tolerating statin, encouraged heart healthy diet, avoid trans fats, minimize simple carbs and saturated fats. Increase exercise as tolerated    Depression  with anxiety Assessment & Plan: On Bupropion and Fluoxetine she feels she is handling stress some better. She is not having outbursts but she still feels anxious. She will continue [facility] counseling and current meds. Falls asleep and stays asleep but notes she has intense dreams most nights.    Preventative health care Assessment & Plan: Patient encouraged to maintain heart healthy diet, regular exercise, adequate sleep. Consider daily probiotics. Take medications as prescribed. Labs ordered and reviewed. Pap and MGM with OB/GYN will request most recent   FH: breast cancer in first degree relative Assessment & Plan: Mother and maternal aunt, aunt was triple negative. Both in early 60s   Dense breast tissue on mammogram, unspecified type Assessment & Plan: She has an appt with a high risk geneticist, Dr Dayton Scrape Iruku later today. Her initial genetic testing was negative for know markers but she did have a variant of unknown significance [id number] and she have dense breast tissue so they are evaluating her   Viral URI with cough Assessment & Plan: She is improving but still has a bit of a cough, she had fevers, chills, myalgias.    Elevated blood pressure reading Assessment & Plan: Acceptable today, was notably higher last week when she was acutely ill. Will continue to monitor     Assessment and Plan    Breast Cancer Risk Patient has a family history of breast cancer (mother and maternal aunt) and dense breast tissue, which increases her risk to 25%. No known genetic abnormalities. Patient is scheduled to see a high-risk specialist, Dr. Heron Sabins, and will undergo an MRI instead of an ultrasound due to her dense breast tissue. -Continue with high-risk specialist appointment and MRI as planned.  Respiratory Infection Recent respiratory infection with lingering cough. No fever or significant respiratory distress. -Continue supportive care (cough medicine, humidifier, honey,  cough drops).  Hypertension Blood pressure slightly elevated, but within acceptable range given current stress levels. Family history of hypertension. -Monitor blood pressure. Reevaluate in 4-5 months or sooner if blood pressure trends upward.  Mental Health Patient on Wellbutrin and fluoxetine. Reports some improvement in impulse control but experiencing vivid dreams. -Continue current medication regimen. Reevaluate in 4-5 months.  General Health Maintenance -Consider COVID booster before the holidays. -Consider HPV vaccination for patient's son.         Danise Edge, MD

## 2022-11-18 NOTE — Assessment & Plan Note (Signed)
She has an appt with a high risk geneticist, Dr Dayton Scrape Iruku later today. Her initial genetic testing was negative for know markers but she did have a variant of unknown significance [id number] and she have dense breast tissue so they are evaluating her

## 2022-11-18 NOTE — Progress Notes (Signed)
[facility] CONSULT NOTE  Patient Care Team: Bradd Canary, MD as PCP - General (Family Medicine) Zelphia Cairo, MD as Consulting Physician (Obstetrics and Gynecology)  CHIEF COMPLAINTS/PURPOSE OF CONSULTATION:  At high risk for breast cancer  ASSESSMENT & PLAN:    We discussed her life time risk of breast cancer and 5 yr risk of breast cancer today which is over 20% and 1.3% respectively  2. We discussed different risk reducing strategies for future breast cancer risk. We discussed chemoprevention and lifestyle modification. We discussed role of ongoing surveillance with mammograms and MRIs.  She is interested in proceeding with MRI surveillance in addition to mammograms.  We have discussed unknown long-term risk of gadolinium deposition and increased sensitivity of MRI which may lead to additional biopsies.  3. Lifestyle modification: We discussed different interventions exercise at least 5 days a week including both aerobic as well as weight-bearing exercises and strength training. He discussed dietary modification including increasing number of servings of fruit and vegetables as well as decreased in number of servings of meat. We discussed the importance of maintaining a good BMI and limiting alcohol intake.  4. Surveillance: Patient certainly would be a good candidate for ongoing mammograms as well as MRIs.  We have also discussed self breast exam. 5. Chemoprevention: Not a candidate.  6. Genetics: Genetic testing negative  7. Followup: She should follow-up with [place] annually and have a breast exam every 6 months by one of her providers.   HISTORY OF PRESENTING ILLNESS:  Stacy Dennis 42 y.o. female is here because of high risk for [facility]. Life time risk for [facility] is 25.2% and 5 yrs risk of BC is 1.3%.  This is a very pleasant 42 year old healthy female patient with significant family history of breast cancer, negative genetic testing, lifetime risk of breast cancer  over 20% estimated at 25.2% referred to high risk breast clinic for additional recommendations. Age at first menstrual cycle: 12 Breast feeding, yes and both kids for one yr. Use of Birth control for 10-15 yrs  She denies any changes in her breast.  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  Rest of the pertinent 10 point ROS reviewed and negative  REVIEW OF SYSTEMS:   Constitutional: Denies fevers, chills or abnormal night sweats Eyes: Denies blurriness of vision, double vision or watery eyes Ears, nose, mouth, throat, and face: Denies mucositis or sore throat Respiratory: Denies cough, dyspnea or wheezes Cardiovascular: Denies palpitation, chest discomfort or lower extremity swelling Gastrointestinal:  Denies nausea, heartburn or change in bowel habits Skin: Denies abnormal skin rashes Lymphatics: Denies new lymphadenopathy or easy bruising Neurological:Denies numbness, tingling or new weaknesses Behavioral/Psych: Mood is stable, no new changes  All other systems were reviewed with the patient and are negative.  MEDICAL HISTORY:  Past Medical History:  Diagnosis Date   Abnormal menses 06/17/2015   Allergic state 01/28/2012   Anxiety 07/06/2016   Dermatitis 06/17/2015   Endometriosis    H/O varicella    Hypercholesteremia    no meds   Hyperlipidemia 01/28/2012   Pregnancy related carpal tunnel syndrome, antepartum 2013   Preventative health care 01/28/2012   Verrucous skin lesion 01/28/2012    SURGICAL HISTORY: Past Surgical History:  Procedure Laterality Date   CESAREAN SECTION  10-10-11   CESAREAN SECTION N/A 12/10/2013   Procedure: CESAREAN SECTION;  Surgeon: Zelphia Cairo, MD;  Location: [facility];  Service: Obstetrics;  Laterality: N/A;  Repeat edc 9/27  EYE SURGERY  2012   lasik   MYRINGOTOMY      SOCIAL HISTORY: Social History   Socioeconomic History   Marital status: Married    Spouse name: Not on file   Number of children: Not on file   Years  of education: Not on file   Highest education level: Not on file  Occupational History   Not on file  Tobacco Use   Smoking status: Never   Smokeless tobacco: Never  Substance and Sexual Activity   Alcohol use: Yes    Comment: socially when not pregnant   Drug use: No   Sexual activity: Yes    Partners: Male    Birth control/protection: None  Other Topics Concern   Not on file  Social History Narrative   Not on file   Social Determinants of Health   Financial Resource Strain: Not on file  Food Insecurity: Not on file  Transportation Needs: Not on file  Physical Activity: Not on file  Stress: Not on file  Social Connections: Not on file  Intimate Partner Violence: Not on file    FAMILY HISTORY: Family History  Problem Relation Age of Onset   Cancer Mother    Breast cancer Mother 63       L DCIS, lump, XRT   Anxiety disorder Mother    Hyperlipidemia Mother    Hypertension Mother    Irritable bowel syndrome Mother        ?   Kidney disease Father        stones   Hyperlipidemia Father    Birth defects Maternal Aunt    Breast cancer Maternal Aunt 21       triple negative receiving chemo now   Thyroid disease Maternal Aunt    Thyroid cancer Maternal Aunt        s/p thyroidectomy   Thyroid disease Paternal Aunt    Hypertension Maternal Grandmother    Anxiety disorder Maternal Grandmother    Heart disease Maternal Grandfather    Hyperlipidemia Paternal Grandmother    Dementia Paternal Grandmother    Other Paternal Grandmother        dementia   Skin cancer Paternal Grandmother    Lung cancer Paternal Grandfather        lung- smoker   Stroke Paternal Grandfather    Aneurysm Paternal Grandfather    Cerebral aneurysm Paternal Grandfather     ALLERGIES:  is allergic to amoxicillin and lactose intolerance (gi).  MEDICATIONS:  Current Outpatient Medications  Medication Sig Dispense Refill   buPROPion (WELLBUTRIN XL) 150 MG 24 hr tablet Take 1 tablet (150 mg  total) by mouth daily. 90 tablet 1   FLUoxetine (PROZAC) 20 MG tablet Take 1 tablet (20 mg total) by mouth daily. 90 tablet 1   methylphenidate (RITALIN LA) 20 MG 24 hr capsule Take 1 capsule (20 mg total) by mouth every morning. 30 capsule 0   Norethindrone Acetate-Ethinyl Estrad-FE (BLISOVI 24 FE) 1-20 MG-MCG(24) tablet Take 1 tablet by mouth daily. 84 tablet 3   rosuvastatin (CRESTOR) 5 MG tablet Take 1 tablet (5 mg total) by mouth daily. 90 tablet 3   No current facility-administered medications for this visit.     PHYSICAL EXAMINATION: ECOG PERFORMANCE STATUS: 0 - Asymptomatic  Vitals:   11/18/22 1030 11/18/22 1032  BP: (!) 152/101 (!) 139/97  Pulse: 88 93  Resp: 18   Temp: 98.7 F (37.1 C)   SpO2: 98%    Filed Weights   11/18/22  1030  Weight: 151 lb 12.8 oz (68.9 kg)    GENERAL:alert, no distress and comfortable Breast: Bilateral breast inspected and palpated, no palpable masses or regional adenopathy No lower extremity edema  LABORATORY DATA:  I have reviewed the data as listed Lab Results  Component Value Date   WBC 9.3 11/10/2022   HGB 12.7 11/10/2022   HCT 38.2 11/10/2022   MCV 88.4 11/10/2022   PLT 344.0 11/10/2022     Chemistry      Component Value Date/Time   NA 136 11/10/2022 1341   K 3.8 11/10/2022 1341   CL 100 11/10/2022 1341   CO2 27 11/10/2022 1341   BUN 10 11/10/2022 1341   CREATININE 0.74 11/10/2022 1341   CREATININE 0.50 08/29/2013 1103      Component Value Date/Time   CALCIUM 9.1 11/10/2022 1341   ALKPHOS 49 11/10/2022 1341   AST 15 11/10/2022 1341   ALT 11 11/10/2022 1341   BILITOT 0.3 11/10/2022 1341       RADIOGRAPHIC STUDIES: I have personally reviewed the radiological images as listed and agreed with the findings in the report. No results found.  All questions were answered. The patient knows to call the clinic with any problems, questions or concerns. I spent 45 minutes in the care of this patient including H and P,  review of records, counseling and coordination of care.     Rachel Moulds, MD 11/19/2022 4:40 PM

## 2022-11-18 NOTE — Patient Instructions (Signed)
Hydrate 60-80 ounces daily Minimum 4000, shoot for over 8000 steps daily  Preventive Care 34-42 Years Old, Female Preventive care refers to lifestyle choices and visits with your health care provider that can promote health and wellness. Preventive care visits are also called wellness exams. What can I expect for my preventive care visit? Counseling Your health care provider may ask you questions about your: Medical history, including: Past medical problems. Family medical history. Pregnancy history. Current health, including: Menstrual cycle. Method of birth control. Emotional well-being. Home life and relationship well-being. Sexual activity and sexual health. Lifestyle, including: Alcohol, nicotine or tobacco, and drug use. Access to firearms. Diet, exercise, and sleep habits. Work and work Astronomer. Sunscreen use. Safety issues such as seatbelt and bike helmet use. Physical exam Your health care provider will check your: Height and weight. These may be used to calculate your BMI (body mass index). BMI is a measurement that tells if you are at a healthy weight. Waist circumference. This measures the distance around your waistline. This measurement also tells if you are at a healthy weight and may help predict your risk of certain diseases, such as type 2 diabetes and high blood pressure. Heart rate and blood pressure. Body temperature. Skin for abnormal spots. What immunizations do I need?  Vaccines are usually given at various ages, according to a schedule. Your health care provider will recommend vaccines for you based on your age, medical history, and lifestyle or other factors, such as travel or where you work. What tests do I need? Screening Your health care provider may recommend screening tests for certain conditions. This may include: Lipid and cholesterol levels. Diabetes screening. This is done by checking your blood sugar (glucose) after you have not eaten for  a while (fasting). Pelvic exam and Pap test. Hepatitis B test. Hepatitis C test. HIV (human immunodeficiency virus) test. STI (sexually transmitted infection) testing, if you are at risk. Lung cancer screening. Colorectal cancer screening. Mammogram. Talk with your health care provider about when you should start having regular mammograms. This may depend on whether you have a family history of breast cancer. BRCA-related cancer screening. This may be done if you have a family history of breast, ovarian, tubal, or peritoneal cancers. Bone density scan. This is done to screen for osteoporosis. Talk with your health care provider about your test results, treatment options, and if necessary, the need for more tests. Follow these instructions at home: Eating and drinking  Eat a diet that includes fresh fruits and vegetables, whole grains, lean protein, and low-fat dairy products. Take vitamin and mineral supplements as recommended by your health care provider. Do not drink alcohol if: Your health care provider tells you not to drink. You are pregnant, may be pregnant, or are planning to become pregnant. If you drink alcohol: Limit how much you have to 0-1 drink a day. Know how much alcohol is in your drink. In the [place] one drink equals one 12 oz bottle of beer (355 mL), one 5 oz glass of wine (148 mL), or one 1 oz glass of hard liquor (44 mL). Lifestyle Brush your teeth every morning and night with fluoride toothpaste. Floss one time each day. Exercise for at least 30 minutes 5 or more days each week. Do not use any products that contain nicotine or tobacco. These products include cigarettes, chewing tobacco, and vaping devices, such as e-cigarettes. If you need help quitting, ask your health care provider. Do not use drugs. If you are sexually  active, practice safe sex. Use a condom or other form of protection to prevent STIs. If you do not wish to become pregnant, use a form of birth  control. If you plan to become pregnant, see your health care provider for a prepregnancy visit. Take aspirin only as told by your health care provider. Make sure that you understand how much to take and what form to take. Work with your health care provider to find out whether it is safe and beneficial for you to take aspirin daily. Find healthy ways to manage stress, such as: Meditation, yoga, or listening to music. Journaling. Talking to a trusted person. Spending time with friends and family. Minimize exposure to UV radiation to reduce your risk of skin cancer. Safety Always wear your seat belt while driving or riding in a vehicle. Do not drive: If you have been drinking alcohol. Do not ride with someone who has been drinking. When you are tired or distracted. While texting. If you have been using any mind-altering substances or drugs. Wear a helmet and other protective equipment during sports activities. If you have firearms in your house, make sure you follow all gun safety procedures. Seek help if you have been physically or sexually abused. What's next? Visit your health care provider once a year for an annual wellness visit. Ask your health care provider how often you should have your eyes and teeth checked. Stay up to date on all vaccines. This information is not intended to replace advice given to you by your health care provider. Make sure you discuss any questions you have with your health care provider. Document Revised: 09/03/2020 Document Reviewed: 09/03/2020 Elsevier Patient Education  2024 [employer].

## 2022-11-18 NOTE — Assessment & Plan Note (Signed)
Mother and maternal aunt, aunt was triple negative. Both in early 60s

## 2022-11-18 NOTE — Assessment & Plan Note (Signed)
Acceptable today, was notably higher last week when she was acutely ill. Will continue to monitor

## 2022-11-18 NOTE — Assessment & Plan Note (Signed)
She is improving but still has a bit of a cough, she had fevers, chills, myalgias.

## 2022-11-22 ENCOUNTER — Other Ambulatory Visit: Payer: Self-pay | Admitting: Family Medicine

## 2022-11-22 ENCOUNTER — Encounter: Payer: Self-pay | Admitting: Family Medicine

## 2022-11-23 ENCOUNTER — Other Ambulatory Visit: Payer: Self-pay

## 2022-11-23 ENCOUNTER — Other Ambulatory Visit (HOSPITAL_COMMUNITY): Payer: Self-pay

## 2022-11-23 MED ORDER — METHYLPHENIDATE HCL ER (LA) 20 MG PO CP24
20.0000 mg | ORAL_CAPSULE | ORAL | 0 refills | Status: DC
Start: 2022-11-23 — End: 2022-12-29
  Filled 2022-11-23: qty 30, 30d supply, fill #0

## 2022-11-23 NOTE — Telephone Encounter (Signed)
Requesting: Ritalin LA 20 mg  Contract: 08/05/2022 UDS: 08/05/2022 Last Visit: 11/18/2022 Next Visit: 04/14/2023 Last Refill: 10/25/2022 #30 no refills  Please Advise

## 2022-12-10 ENCOUNTER — Other Ambulatory Visit: Payer: Self-pay | Admitting: Family Medicine

## 2022-12-10 ENCOUNTER — Other Ambulatory Visit (HOSPITAL_COMMUNITY): Payer: Self-pay

## 2022-12-10 MED ORDER — BUPROPION HCL ER (XL) 150 MG PO TB24
150.0000 mg | ORAL_TABLET | Freq: Every day | ORAL | 0 refills | Status: DC
  Filled 2022-12-10: qty 90, 90d supply, fill #0

## 2022-12-14 ENCOUNTER — Other Ambulatory Visit (HOSPITAL_COMMUNITY): Payer: Self-pay

## 2022-12-27 ENCOUNTER — Other Ambulatory Visit: Payer: Self-pay | Admitting: Family

## 2022-12-29 ENCOUNTER — Other Ambulatory Visit (HOSPITAL_COMMUNITY): Payer: Self-pay

## 2022-12-29 MED ORDER — METHYLPHENIDATE HCL ER (LA) 20 MG PO CP24
20.0000 mg | ORAL_CAPSULE | ORAL | 0 refills | Status: DC
Start: 2022-12-29 — End: 2023-01-27
  Filled 2022-12-29: qty 30, 30d supply, fill #0

## 2023-01-27 ENCOUNTER — Other Ambulatory Visit: Payer: Self-pay

## 2023-01-27 ENCOUNTER — Other Ambulatory Visit: Payer: Self-pay | Admitting: Family

## 2023-01-27 ENCOUNTER — Other Ambulatory Visit (HOSPITAL_COMMUNITY): Payer: Self-pay

## 2023-01-27 DIAGNOSIS — Z1231 Encounter for screening mammogram for malignant neoplasm of breast: Secondary | ICD-10-CM | POA: Diagnosis not present

## 2023-01-27 DIAGNOSIS — Z3040 Encounter for surveillance of contraceptives, unspecified: Secondary | ICD-10-CM | POA: Diagnosis not present

## 2023-01-27 DIAGNOSIS — Z01419 Encounter for gynecological examination (general) (routine) without abnormal findings: Secondary | ICD-10-CM | POA: Diagnosis not present

## 2023-01-27 DIAGNOSIS — Z6831 Body mass index (BMI) 31.0-31.9, adult: Secondary | ICD-10-CM | POA: Diagnosis not present

## 2023-01-27 MED ORDER — BLISOVI 24 FE 1-20 MG-MCG(24) PO TABS
1.0000 | ORAL_TABLET | Freq: Every day | ORAL | 3 refills | Status: DC
  Filled 2023-01-27 – 2023-02-11 (×2): qty 84, 84d supply, fill #0
  Filled 2023-05-10: qty 84, 84d supply, fill #1
  Filled 2023-07-31: qty 84, 84d supply, fill #2
  Filled 2023-10-26: qty 84, 84d supply, fill #3

## 2023-01-28 ENCOUNTER — Other Ambulatory Visit: Payer: Self-pay | Admitting: Family Medicine

## 2023-01-28 ENCOUNTER — Other Ambulatory Visit (HOSPITAL_COMMUNITY): Payer: Self-pay

## 2023-01-28 NOTE — Telephone Encounter (Signed)
Requesting: Ritalin LA 20 mg Contract: 08/05/2022 UDS: 08/05/2022 Last Visit: 11/18/2022 Next Visit: 04/14/2023 Last Refill: 12/29/2022 #30 no rf  Please Advise

## 2023-01-29 MED ORDER — METHYLPHENIDATE HCL ER (LA) 20 MG PO CP24
20.0000 mg | ORAL_CAPSULE | ORAL | 0 refills | Status: DC
Start: 2023-01-29 — End: 2023-03-04
  Filled 2023-01-29: qty 30, 30d supply, fill #0

## 2023-01-31 ENCOUNTER — Other Ambulatory Visit: Payer: Self-pay | Admitting: Family Medicine

## 2023-01-31 ENCOUNTER — Other Ambulatory Visit (HOSPITAL_COMMUNITY): Payer: Self-pay

## 2023-01-31 MED ORDER — FLUOXETINE HCL 20 MG PO TABS
20.0000 mg | ORAL_TABLET | Freq: Every day | ORAL | 0 refills | Status: DC
  Filled 2023-01-31: qty 90, 90d supply, fill #0

## 2023-02-11 ENCOUNTER — Other Ambulatory Visit (HOSPITAL_COMMUNITY): Payer: Self-pay

## 2023-03-03 ENCOUNTER — Other Ambulatory Visit: Payer: Self-pay | Admitting: Family

## 2023-03-04 ENCOUNTER — Other Ambulatory Visit: Payer: Self-pay | Admitting: Family Medicine

## 2023-03-04 ENCOUNTER — Other Ambulatory Visit (HOSPITAL_COMMUNITY): Payer: Self-pay

## 2023-03-04 MED ORDER — METHYLPHENIDATE HCL ER (LA) 20 MG PO CP24
20.0000 mg | ORAL_CAPSULE | ORAL | 0 refills | Status: DC
Start: 2023-03-04 — End: 2023-04-12
  Filled 2023-03-04: qty 30, 30d supply, fill #0

## 2023-03-08 ENCOUNTER — Other Ambulatory Visit: Payer: Self-pay | Admitting: Family Medicine

## 2023-03-08 ENCOUNTER — Other Ambulatory Visit (HOSPITAL_COMMUNITY): Payer: Self-pay

## 2023-03-08 MED ORDER — BUPROPION HCL ER (XL) 150 MG PO TB24
150.0000 mg | ORAL_TABLET | Freq: Every day | ORAL | 1 refills | Status: DC
  Filled 2023-03-08: qty 90, 90d supply, fill #0

## 2023-04-08 ENCOUNTER — Other Ambulatory Visit: Payer: Self-pay | Admitting: Family

## 2023-04-10 NOTE — Assessment & Plan Note (Signed)
On Bupropion and Fluoxetine

## 2023-04-10 NOTE — Assessment & Plan Note (Signed)
Tolerating statin, encouraged heart healthy diet, avoid trans fats, minimize simple carbs and saturated fats. Increase exercise as tolerated 

## 2023-04-10 NOTE — Assessment & Plan Note (Signed)
Stable on current meds, no changes. 

## 2023-04-12 ENCOUNTER — Other Ambulatory Visit (HOSPITAL_COMMUNITY): Payer: Self-pay

## 2023-04-12 ENCOUNTER — Other Ambulatory Visit: Payer: Self-pay | Admitting: Family Medicine

## 2023-04-12 MED ORDER — METHYLPHENIDATE HCL ER (LA) 20 MG PO CP24
20.0000 mg | ORAL_CAPSULE | ORAL | 0 refills | Status: DC
Start: 2023-04-12 — End: 2023-07-13
  Filled 2023-04-12: qty 30, 30d supply, fill #0

## 2023-04-12 NOTE — Telephone Encounter (Signed)
Requesting: methylphenidate (Ritalin) 20 mg 24 hr Capsule Contract: 08/07/2022 UDS: 08/07/2022 Last Visit: 11/18/2022 Next Visit: 04/14/2023 Last Refill: 03/04/2023  Please Advise

## 2023-04-12 NOTE — Telephone Encounter (Signed)
Copied from CRM [id number]. Topic: Clinical - Medication Refill >> Apr 12, 2023  4:10 PM Lennart Pall wrote: Most Recent Primary Care Visit:  Provider: Danise Edge A  Department: [facility]  Visit Type: PHYSICAL  Date: 11/18/2022  Medication: ***  Has the patient contacted their pharmacy?  (Agent: If no, request that the patient contact the pharmacy for the refill. If patient does not wish to contact the pharmacy document the reason why and proceed with request.) (Agent: If yes, when and what did the pharmacy advise?)  Is this the correct pharmacy for this prescription?  If no, delete pharmacy and type the correct one.  This is the patient's preferred pharmacy:  [facility] [id number] [place] [address] Phone: [phone] Fax: [phone]   Has the prescription been filled recently?   Is the patient out of the medication?   Has the patient been seen for an appointment in the last year OR does the patient have an upcoming appointment?   Can we respond through MyChart?   Agent: Please be advised that Rx refills may take up to 3 business days. We ask that you follow-up with your pharmacy.

## 2023-04-12 NOTE — Telephone Encounter (Signed)
Requesting: Ritalin LA 20mg   Contract: 08/05/22 UDS: 08/05/22 Last Visit: 11/18/22 Next Visit: 04/14/23 Last Refill: 03/04/23 #30 and 0RF   Please Advise

## 2023-04-13 ENCOUNTER — Other Ambulatory Visit (HOSPITAL_COMMUNITY): Payer: Self-pay

## 2023-04-14 ENCOUNTER — Other Ambulatory Visit: Payer: Self-pay | Admitting: Family Medicine

## 2023-04-14 ENCOUNTER — Other Ambulatory Visit (HOSPITAL_COMMUNITY): Payer: Self-pay

## 2023-04-14 ENCOUNTER — Ambulatory Visit (INDEPENDENT_AMBULATORY_CARE_PROVIDER_SITE_OTHER): Payer: 59 | Admitting: Family Medicine

## 2023-04-14 ENCOUNTER — Encounter: Payer: Self-pay | Admitting: Family Medicine

## 2023-04-14 VITALS — BP 148/98 | HR 72 | Temp 98.5°F | Resp 18 | Ht 59.0 in | Wt 154.0 lb

## 2023-04-14 DIAGNOSIS — R030 Elevated blood-pressure reading, without diagnosis of hypertension: Secondary | ICD-10-CM | POA: Diagnosis not present

## 2023-04-14 DIAGNOSIS — F418 Other specified anxiety disorders: Secondary | ICD-10-CM | POA: Diagnosis not present

## 2023-04-14 DIAGNOSIS — E785 Hyperlipidemia, unspecified: Secondary | ICD-10-CM | POA: Diagnosis not present

## 2023-04-14 DIAGNOSIS — F909 Attention-deficit hyperactivity disorder, unspecified type: Secondary | ICD-10-CM

## 2023-04-14 LAB — CBC WITH DIFFERENTIAL/PLATELET
Basophils Absolute: 0.1 10*3/uL (ref 0.0–0.1)
Basophils Relative: 1.1 % (ref 0.0–3.0)
Eosinophils Absolute: 0.1 10*3/uL (ref 0.0–0.7)
Eosinophils Relative: 1.4 % (ref 0.0–5.0)
HCT: 42.6 % (ref 36.0–46.0)
Hemoglobin: 14.4 g/dL (ref 12.0–15.0)
Lymphocytes Relative: 27.3 % (ref 12.0–46.0)
Lymphs Abs: 1.8 10*3/uL (ref 0.7–4.0)
MCHC: 33.9 g/dL (ref 30.0–36.0)
MCV: 88.6 fL (ref 78.0–100.0)
Monocytes Absolute: 0.4 10*3/uL (ref 0.1–1.0)
Monocytes Relative: 5.8 % (ref 3.0–12.0)
Neutro Abs: 4.3 10*3/uL (ref 1.4–7.7)
Neutrophils Relative %: 64.4 % (ref 43.0–77.0)
Platelets: 394 10*3/uL (ref 150.0–400.0)
RBC: 4.81 Mil/uL (ref 3.87–5.11)
RDW: 12.7 % (ref 11.5–15.5)
WBC: 6.6 10*3/uL (ref 4.0–10.5)

## 2023-04-14 LAB — COMPREHENSIVE METABOLIC PANEL
ALT: 15 U/L (ref 0–35)
AST: 19 U/L (ref 0–37)
Albumin: 4.7 g/dL (ref 3.5–5.2)
Alkaline Phosphatase: 48 U/L (ref 39–117)
BUN: 11 mg/dL (ref 6–23)
CO2: 25 meq/L (ref 19–32)
Calcium: 9.6 mg/dL (ref 8.4–10.5)
Chloride: 102 meq/L (ref 96–112)
Creatinine, Ser: 0.7 mg/dL (ref 0.40–1.20)
GFR: 106.33 mL/min (ref >60.00)
Glucose, Bld: 86 mg/dL (ref 70–99)
Potassium: 4.6 meq/L (ref 3.5–5.1)
Sodium: 136 meq/L (ref 135–145)
Total Bilirubin: 0.9 mg/dL (ref 0.2–1.2)
Total Protein: 6.9 g/dL (ref 6.0–8.3)

## 2023-04-14 LAB — LIPID PANEL
Cholesterol: 208 mg/dL — ABNORMAL HIGH (ref 0–200)
HDL: 68.9 mg/dL (ref >39.00)
LDL Cholesterol: 122 mg/dL — ABNORMAL HIGH (ref 0–99)
NonHDL: 139.26
Total CHOL/HDL Ratio: 3
Triglycerides: 85 mg/dL (ref 0.0–149.0)
VLDL: 17 mg/dL (ref 0.0–40.0)

## 2023-04-14 LAB — TSH: TSH: 0.93 u[IU]/mL (ref 0.35–5.50)

## 2023-04-14 MED ORDER — METHYLPHENIDATE HCL ER (LA) 20 MG PO CP24
20.0000 mg | ORAL_CAPSULE | ORAL | 0 refills | Status: DC
  Filled 2023-04-14 – 2023-05-12 (×2): qty 30, 30d supply, fill #0

## 2023-04-14 MED ORDER — METHYLPHENIDATE HCL ER (LA) 20 MG PO CP24
20.0000 mg | ORAL_CAPSULE | ORAL | 0 refills | Status: DC
  Filled 2023-04-14: qty 30, 30d supply, fill #0

## 2023-04-14 MED ORDER — METOPROLOL SUCCINATE ER 25 MG PO TB24
25.0000 mg | ORAL_TABLET | Freq: Every day | ORAL | 3 refills | Status: DC
  Filled 2023-04-14: qty 30, 30d supply, fill #0
  Filled 2023-05-10: qty 30, 30d supply, fill #1
  Filled 2023-06-09: qty 30, 30d supply, fill #2
  Filled 2023-07-13: qty 30, 30d supply, fill #3

## 2023-04-14 MED ORDER — METHYLPHENIDATE HCL ER (LA) 20 MG PO CP24
20.0000 mg | ORAL_CAPSULE | ORAL | 0 refills | Status: DC
  Filled 2023-04-14 – 2023-06-09 (×3): qty 30, 30d supply, fill #0

## 2023-04-14 NOTE — Patient Instructions (Signed)
Hypertension, Adult High blood pressure (hypertension) is when the force of blood pumping through the arteries is too strong. The arteries are the blood vessels that carry blood from the heart throughout the body. Hypertension forces the heart to work harder to pump blood and may cause arteries to become narrow or stiff. Untreated or uncontrolled hypertension can lead to a heart attack, heart failure, a stroke, kidney disease, and other problems. A blood pressure reading consists of a higher number over a lower number. Ideally, your blood pressure should be below 120/80. The first ("top") number is called the systolic pressure. It is a measure of the pressure in your arteries as your heart beats. The second ("bottom") number is called the diastolic pressure. It is a measure of the pressure in your arteries as the heart relaxes. What are the causes? The exact cause of this condition is not known. There are some conditions that result in high blood pressure. What increases the risk? Certain factors may make you more likely to develop high blood pressure. Some of these risk factors are under your control, including: Smoking. Not getting enough exercise or physical activity. Being overweight. Having too much fat, sugar, calories, or salt (sodium) in your diet. Drinking too much alcohol. Other risk factors include: Having a personal history of heart disease, diabetes, high cholesterol, or kidney disease. Stress. Having a family history of high blood pressure and high cholesterol. Having obstructive sleep apnea. Age. The risk increases with age. What are the signs or symptoms? High blood pressure may not cause symptoms. Very high blood pressure (hypertensive crisis) may cause: Headache. Fast or irregular heartbeats (palpitations). Shortness of breath. Nosebleed. Nausea and vomiting. Vision changes. Severe chest pain, dizziness, and seizures. How is this diagnosed? This condition is diagnosed by  measuring your blood pressure while you are seated, with your arm resting on a flat surface, your legs uncrossed, and your feet flat on the floor. The cuff of the blood pressure monitor will be placed directly against the skin of your upper arm at the level of your heart. Blood pressure should be measured at least twice using the same arm. Certain conditions can cause a difference in blood pressure between your right and left arms. If you have a high blood pressure reading during one visit or you have normal blood pressure with other risk factors, you may be asked to: Return on a different day to have your blood pressure checked again. Monitor your blood pressure at home for 1 week or longer. If you are diagnosed with hypertension, you may have other blood or imaging tests to help your health care provider understand your overall risk for other conditions. How is this treated? This condition is treated by making healthy lifestyle changes, such as eating healthy foods, exercising more, and reducing your alcohol intake. You may be referred for counseling on a healthy diet and physical activity. Your health care provider may prescribe medicine if lifestyle changes are not enough to get your blood pressure under control and if: Your systolic blood pressure is above 130. Your diastolic blood pressure is above 80. Your personal target blood pressure may vary depending on your medical conditions, your age, and other factors. Follow these instructions at home: Eating and drinking  Eat a diet that is high in fiber and potassium, and low in sodium, added sugar, and fat. An example of this eating plan is called the DASH diet. DASH stands for Dietary Approaches to Stop Hypertension. To eat this way: Eat   plenty of fresh fruits and vegetables. Try to fill one half of your plate at each meal with fruits and vegetables. Eat whole grains, such as whole-wheat pasta, brown rice, or whole-grain bread. Fill about one  fourth of your plate with whole grains. Eat or drink low-fat dairy products, such as skim milk or low-fat yogurt. Avoid fatty cuts of meat, processed or cured meats, and poultry with skin. Fill about one fourth of your plate with lean proteins, such as fish, chicken without skin, beans, eggs, or tofu. Avoid pre-made and processed foods. These tend to be higher in sodium, added sugar, and fat. Reduce your daily sodium intake. Many people with hypertension should eat less than 1,500 mg of sodium a day. Do not drink alcohol if: Your health care provider tells you not to drink. You are pregnant, may be pregnant, or are planning to become pregnant. If you drink alcohol: Limit how much you have to: 0-1 drink a day for women. 0-2 drinks a day for men. Know how much alcohol is in your drink. In the [place] one drink equals one 12 oz bottle of beer (355 mL), one 5 oz glass of wine (148 mL), or one 1 oz glass of hard liquor (44 mL). Lifestyle  Work with your health care provider to maintain a healthy body weight or to lose weight. Ask what an ideal weight is for you. Get at least 30 minutes of exercise that causes your heart to beat faster (aerobic exercise) most days of the week. Activities may include walking, swimming, or biking. Include exercise to strengthen your muscles (resistance exercise), such as Pilates or lifting weights, as part of your weekly exercise routine. Try to do these types of exercises for 30 minutes at least 3 days a week. Do not use any products that contain nicotine or tobacco. These products include cigarettes, chewing tobacco, and vaping devices, such as e-cigarettes. If you need help quitting, ask your health care provider. Monitor your blood pressure at home as told by your health care provider. Keep all follow-up visits. This is important. Medicines Take over-the-counter and prescription medicines only as told by your health care provider. Follow directions carefully. Blood  pressure medicines must be taken as prescribed. Do not skip doses of blood pressure medicine. Doing this puts you at risk for problems and can make the medicine less effective. Ask your health care provider about side effects or reactions to medicines that you should watch for. Contact a health care provider if you: Think you are having a reaction to a medicine you are taking. Have headaches that keep coming back (recurring). Feel dizzy. Have swelling in your ankles. Have trouble with your vision. Get help right away if you: Develop a severe headache or confusion. Have unusual weakness or numbness. Feel faint. Have severe pain in your chest or abdomen. Vomit repeatedly. Have trouble breathing. These symptoms may be an emergency. Get help right away. Call 911. Do not wait to see if the symptoms will go away. Do not drive yourself to the hospital. Summary Hypertension is when the force of blood pumping through your arteries is too strong. If this condition is not controlled, it may put you at risk for serious complications. Your personal target blood pressure may vary depending on your medical conditions, your age, and other factors. For most people, a normal blood pressure is less than 120/80. Hypertension is treated with lifestyle changes, medicines, or a combination of both. Lifestyle changes include losing weight, eating a healthy,   low-sodium diet, exercising more, and limiting alcohol. This information is not intended to replace advice given to you by your health care provider. Make sure you discuss any questions you have with your health care provider. Document Revised: 01/13/2021 Document Reviewed: 01/13/2021 Elsevier Patient Education  2024 [employer]  

## 2023-04-14 NOTE — Progress Notes (Signed)
Subjective:    Patient ID: Stacy Dennis, female    DOB: [birth year], 43 y.o.   MRN: [id number]  Chief Complaint  Patient presents with   Follow-up    4 month    HPI Discussed the use of AI scribe software for clinical note transcription with the patient, who gave verbal consent to proceed.  History of Present Illness   The patient, in her forties, she presents with concerns about persistently high blood pressure readings noted at various healthcare settings. The patient has a history of high cholesterol, which has improved with Crestor, but the blood pressure remains high. The patient is currently on multiple medications including fluoxetine, methylphenidate, bupropion, and Crestor. The patient reports feeling tired despite getting adequate sleep and experiencing vivid dreams. The patient also reports changes in their menstrual cycle while on birth control for endometriosis. The patient is concerned about the potential need for additional medication to manage their blood pressure.        Past Medical History:  Diagnosis Date   Abnormal menses 06/17/2015   Allergic state 01/28/2012   Anxiety 07/06/2016   Dermatitis 06/17/2015   Endometriosis    H/O varicella    Hypercholesteremia    no meds   Hyperlipidemia 01/28/2012   Pregnancy related carpal tunnel syndrome, antepartum 2013   Preventative health care 01/28/2012   Verrucous skin lesion 01/28/2012    Past Surgical History:  Procedure Laterality Date   CESAREAN SECTION  10-10-11   CESAREAN SECTION N/A 12/10/2013   Procedure: CESAREAN SECTION;  Surgeon: Zelphia Cairo, MD;  Location: [facility];  Service: Obstetrics;  Laterality: N/A;  Repeat edc 9/27   EYE SURGERY  2012   lasik   MYRINGOTOMY      Family History  Problem Relation Age of Onset   Cancer Mother    Breast cancer Mother 67       L DCIS, lump, XRT   Anxiety disorder Mother    Hyperlipidemia Mother    Hypertension Mother    Irritable bowel syndrome Mother         ?   Kidney disease Father        stones   Hyperlipidemia Father    Birth defects Maternal Aunt    Breast cancer Maternal Aunt 75       triple negative receiving chemo now   Thyroid disease Maternal Aunt    Thyroid cancer Maternal Aunt        s/p thyroidectomy   Thyroid disease Paternal Aunt    Hypertension Maternal Grandmother    Anxiety disorder Maternal Grandmother    Heart disease Maternal Grandfather    Hyperlipidemia Paternal Grandmother    Dementia Paternal Grandmother    Other Paternal Grandmother        dementia   Skin cancer Paternal Grandmother    Lung cancer Paternal Grandfather        lung- smoker   Stroke Paternal Grandfather    Aneurysm Paternal Grandfather    Cerebral aneurysm Paternal Grandfather     Social History   Socioeconomic History   Marital status: Married    Spouse name: Not on file   Number of children: Not on file   Years of education: Not on file   Highest education level: Not on file  Occupational History   Not on file  Tobacco Use   Smoking status: Never   Smokeless tobacco: Never  Substance and Sexual Activity   Alcohol use: Yes    Comment: socially  when not pregnant   Drug use: No   Sexual activity: Yes    Partners: Male    Birth control/protection: None  Other Topics Concern   Not on file  Social History Narrative   Not on file   Social Drivers of Health   Financial Resource Strain: Not on file  Food Insecurity: Not on file  Transportation Needs: Not on file  Physical Activity: Not on file  Stress: Not on file  Social Connections: Not on file  Intimate Partner Violence: Not on file    Outpatient Medications Prior to Visit  Medication Sig Dispense Refill   buPROPion (WELLBUTRIN XL) 150 MG 24 hr tablet Take 1 tablet (150 mg total) by mouth daily. 90 tablet 1   FLUoxetine (PROZAC) 20 MG tablet Take 1 tablet (20 mg total) by mouth daily. 90 tablet 0   methylphenidate (RITALIN LA) 20 MG 24 hr capsule Take 1 capsule (20 mg  total) by mouth every morning. 30 capsule 0   Norethindrone Acetate-Ethinyl Estrad-FE (BLISOVI 24 FE) 1-20 MG-MCG(24) tablet Take 1 tablet by mouth daily. 84 tablet 3   rosuvastatin (CRESTOR) 5 MG tablet Take 1 tablet (5 mg total) by mouth daily. 90 tablet 3   No facility-administered medications prior to visit.    Allergies  Allergen Reactions   Amoxicillin Nausea And Vomiting   Lactose Intolerance (Gi)     Review of Systems  Constitutional:  Negative for fever and malaise/fatigue.  HENT:  Negative for congestion.   Eyes:  Negative for blurred vision.  Respiratory:  Negative for shortness of breath.   Cardiovascular:  Negative for chest pain, palpitations and leg swelling.  Gastrointestinal:  Negative for abdominal pain, blood in stool and nausea.  Genitourinary:  Negative for dysuria and frequency.  Musculoskeletal:  Negative for falls.  Skin:  Negative for rash.  Neurological:  Negative for dizziness, loss of consciousness and headaches.  Endo/Heme/Allergies:  Negative for environmental allergies.  Psychiatric/Behavioral:  Negative for depression. The patient is nervous/anxious.        Objective:    Physical Exam Constitutional:      General: She is not in acute distress.    Appearance: Normal appearance. She is well-developed. She is not toxic-appearing.  HENT:     Head: Normocephalic and atraumatic.     Right Ear: External ear normal.     Left Ear: External ear normal.     Nose: Nose normal.  Eyes:     General:        Right eye: No discharge.        Left eye: No discharge.     Conjunctiva/sclera: Conjunctivae normal.  Neck:     Thyroid: No thyromegaly.  Cardiovascular:     Rate and Rhythm: Normal rate and regular rhythm.     Heart sounds: Normal heart sounds. No murmur heard. Pulmonary:     Effort: Pulmonary effort is normal. No respiratory distress.     Breath sounds: Normal breath sounds.  Abdominal:     General: Bowel sounds are normal.     Palpations:  Abdomen is soft.     Tenderness: There is no abdominal tenderness. There is no guarding.  Musculoskeletal:        General: Normal range of motion.     Cervical back: Neck supple.  Lymphadenopathy:     Cervical: No cervical adenopathy.  Skin:    General: Skin is warm and dry.  Neurological:     Mental Status: She is alert and  oriented to person, place, and time.  Psychiatric:        Mood and Affect: Mood normal.        Behavior: Behavior normal.        Thought Content: Thought content normal.        Judgment: Judgment normal.     BP (!) 148/98   Pulse 72   Temp 98.5 F (36.9 C) (Oral)   Resp 18   Ht 4\' 11"  (1.499 m)   Wt 154 lb (69.9 kg)   SpO2 98%   BMI 31.10 kg/m  Wt Readings from Last 3 Encounters:  04/14/23 154 lb (69.9 kg)  11/18/22 151 lb 12.8 oz (68.9 kg)  11/18/22 152 lb (68.9 kg)    Diabetic Foot Exam - Simple   No data filed    Lab Results  Component Value Date   WBC 6.6 04/14/2023   HGB 14.4 04/14/2023   HCT 42.6 04/14/2023   PLT 394.0 04/14/2023   GLUCOSE 86 04/14/2023   CHOL 208 (H) 04/14/2023   TRIG 85.0 04/14/2023   HDL 68.90 04/14/2023   LDLDIRECT 152.7 02/03/2012   LDLCALC 122 (H) 04/14/2023   ALT 15 04/14/2023   AST 19 04/14/2023   NA 136 04/14/2023   K 4.6 04/14/2023   CL 102 04/14/2023   CREATININE 0.70 04/14/2023   BUN 11 04/14/2023   CO2 25 04/14/2023   TSH 0.93 04/14/2023   HGBA1C 5.5 11/10/2022    Lab Results  Component Value Date   TSH 0.93 04/14/2023   Lab Results  Component Value Date   WBC 6.6 04/14/2023   HGB 14.4 04/14/2023   HCT 42.6 04/14/2023   MCV 88.6 04/14/2023   PLT 394.0 04/14/2023   Lab Results  Component Value Date   NA 136 04/14/2023   K 4.6 04/14/2023   CO2 25 04/14/2023   GLUCOSE 86 04/14/2023   BUN 11 04/14/2023   CREATININE 0.70 04/14/2023   BILITOT 0.9 04/14/2023   ALKPHOS 48 04/14/2023   AST 19 04/14/2023   ALT 15 04/14/2023   PROT 6.9 04/14/2023   ALBUMIN 4.7 04/14/2023   CALCIUM  9.6 04/14/2023   GFR 106.33 04/14/2023   Lab Results  Component Value Date   CHOL 208 (H) 04/14/2023   Lab Results  Component Value Date   HDL 68.90 04/14/2023   Lab Results  Component Value Date   LDLCALC 122 (H) 04/14/2023   Lab Results  Component Value Date   TRIG 85.0 04/14/2023   Lab Results  Component Value Date   CHOLHDL 3 04/14/2023   Lab Results  Component Value Date   HGBA1C 5.5 11/10/2022       Assessment & Plan:  Hyperlipidemia, unspecified hyperlipidemia type Assessment & Plan: Tolerating statin, encouraged heart healthy diet, avoid trans fats, minimize simple carbs and saturated fats. Increase exercise as tolerated   Orders: -     Apolipoprotein B -     Lipoprotein A (LPA) -     Lipid panel  Depression with anxiety Assessment & Plan: On Bupropion and Fluoxetine she feels she is handling stress some better. She is not having outbursts but she still feels anxious. She will continue [facility] counseling and current meds. Falls asleep and stays asleep but notes she has intense dreams most nights.    Attention deficit hyperactivity disorder (ADHD), unspecified ADHD type Assessment & Plan: Stable on current meds, no changes   Elevated blood pressure reading -     CBC with  Differential/Platelet -     Comprehensive metabolic panel -     TSH    Assessment and Plan    Hyperlipidemia Family history of high cholesterol. Currently on Crestor with significant improvement. Discussed genetic markers ApoB and LP little a. -Continue Crestor. -Order ApoB and LP little a tests.  Hypertension Elevated blood pressure readings. Discussed the benefits of beta blockers such as Metoprolol or Carvedilol. -Start Metoprolol XL 25mg  once daily. -Recheck blood pressure in 4-6 weeks.  Endometriosis Currently managed with birth control. No current symptoms. -Continue current birth control regimen.  Sleep disturbances Reports of feeling exhausted despite adequate  sleep. Frequent dreams. Currently on Prozac, Wellbutrin, and Ritalin. -Continue current medication regimen. -Consider lifestyle modifications for sleep hygiene.  General Health Maintenance -Order routine blood work including thyroid and kidney function tests. -Schedule follow-up in 4 months. -Schedule blood pressure check in 4-6 weeks.         Danise Edge, MD

## 2023-04-15 LAB — APOLIPOPROTEIN B: Apolipoprotein B: 108 mg/dL — ABNORMAL HIGH (ref <90)

## 2023-04-16 ENCOUNTER — Encounter: Payer: Self-pay | Admitting: Family Medicine

## 2023-04-19 LAB — LIPOPROTEIN A (LPA): Lipoprotein (a): 338 nmol/L — ABNORMAL HIGH (ref <75)

## 2023-04-21 ENCOUNTER — Other Ambulatory Visit (HOSPITAL_COMMUNITY): Payer: Self-pay

## 2023-04-21 MED ORDER — ROSUVASTATIN CALCIUM 10 MG PO TABS
10.0000 mg | ORAL_TABLET | Freq: Every day | ORAL | 3 refills | Status: DC
  Filled 2023-04-21: qty 90, 90d supply, fill #0
  Filled 2023-07-31: qty 90, 90d supply, fill #1

## 2023-04-21 NOTE — Addendum Note (Signed)
Addended by: Maximino Sarin on: 04/21/2023 02:32 PM   Modules accepted: Orders

## 2023-05-10 ENCOUNTER — Other Ambulatory Visit: Payer: Self-pay

## 2023-05-10 ENCOUNTER — Other Ambulatory Visit: Payer: Self-pay | Admitting: Family Medicine

## 2023-05-10 ENCOUNTER — Other Ambulatory Visit (HOSPITAL_COMMUNITY): Payer: Self-pay

## 2023-05-10 MED ORDER — FLUOXETINE HCL 20 MG PO TABS
20.0000 mg | ORAL_TABLET | Freq: Every day | ORAL | 1 refills | Status: DC
  Filled 2023-05-10: qty 90, 90d supply, fill #0
  Filled 2023-09-12: qty 90, 90d supply, fill #1

## 2023-05-12 ENCOUNTER — Other Ambulatory Visit (HOSPITAL_COMMUNITY): Payer: Self-pay

## 2023-05-16 ENCOUNTER — Other Ambulatory Visit: Payer: Self-pay | Admitting: Family Medicine

## 2023-05-19 ENCOUNTER — Ambulatory Visit: Payer: 59

## 2023-06-02 ENCOUNTER — Other Ambulatory Visit (HOSPITAL_COMMUNITY): Payer: Self-pay

## 2023-06-02 DIAGNOSIS — D1030 Benign neoplasm of unspecified part of mouth: Secondary | ICD-10-CM | POA: Diagnosis not present

## 2023-06-02 DIAGNOSIS — L568 Other specified acute skin changes due to ultraviolet radiation: Secondary | ICD-10-CM | POA: Diagnosis not present

## 2023-06-02 MED ORDER — FLUOROURACIL 5 % EX CREA
1.0000 | TOPICAL_CREAM | Freq: Two times a day (BID) | CUTANEOUS | 0 refills | Status: AC
  Filled 2023-06-02: qty 40, 14d supply, fill #0

## 2023-06-09 ENCOUNTER — Other Ambulatory Visit (HOSPITAL_COMMUNITY): Payer: Self-pay

## 2023-06-09 ENCOUNTER — Other Ambulatory Visit: Payer: Self-pay

## 2023-06-16 ENCOUNTER — Encounter: Payer: Self-pay | Admitting: Family Medicine

## 2023-06-18 ENCOUNTER — Other Ambulatory Visit: Payer: Self-pay | Admitting: Family Medicine

## 2023-06-18 MED ORDER — BUPROPION HCL ER (SR) 100 MG PO TB12
100.0000 mg | ORAL_TABLET | Freq: Two times a day (BID) | ORAL | 1 refills | Status: DC
  Filled 2023-06-18: qty 30, 15d supply, fill #0

## 2023-06-20 ENCOUNTER — Other Ambulatory Visit: Payer: Self-pay

## 2023-06-20 ENCOUNTER — Other Ambulatory Visit (HOSPITAL_COMMUNITY): Payer: Self-pay

## 2023-07-13 ENCOUNTER — Other Ambulatory Visit: Payer: Self-pay | Admitting: Family Medicine

## 2023-07-13 ENCOUNTER — Encounter: Payer: Self-pay | Admitting: Hematology and Oncology

## 2023-07-13 MED ORDER — METHYLPHENIDATE HCL ER (LA) 20 MG PO CP24
20.0000 mg | ORAL_CAPSULE | ORAL | 0 refills | Status: DC
  Filled 2023-07-13: qty 30, 30d supply, fill #0

## 2023-07-13 MED ORDER — METHYLPHENIDATE HCL ER (LA) 20 MG PO CP24
20.0000 mg | ORAL_CAPSULE | ORAL | 0 refills | Status: AC
Start: 2023-07-21 — End: ?
  Filled 2023-07-13 – 2023-08-11 (×2): qty 30, 30d supply, fill #0

## 2023-07-13 NOTE — Telephone Encounter (Signed)
 Requesting: Ritalin  20mg   Contract: Yes UDS: 08/05/22 Last Visit: 04/14/2023 Next Visit: 09/01/2023 Last Refill: 04/14/23-3 months  Please Advise

## 2023-07-14 ENCOUNTER — Other Ambulatory Visit: Payer: Self-pay

## 2023-07-14 ENCOUNTER — Other Ambulatory Visit (HOSPITAL_COMMUNITY): Payer: Self-pay

## 2023-07-20 ENCOUNTER — Encounter: Payer: Self-pay | Admitting: Hematology and Oncology

## 2023-07-21 ENCOUNTER — Ambulatory Visit
Admission: RE | Admit: 2023-07-21 | Discharge: 2023-07-21 | Disposition: A | Discharge status: Home / Self Care | Payer: 59 | Source: Ambulatory Visit | Attending: Hematology and Oncology | Admitting: Hematology and Oncology

## 2023-07-21 DIAGNOSIS — Z803 Family history of malignant neoplasm of breast: Secondary | ICD-10-CM

## 2023-07-21 DIAGNOSIS — Z9189 Other specified personal risk factors, not elsewhere classified: Secondary | ICD-10-CM

## 2023-07-21 DIAGNOSIS — Z1239 Encounter for other screening for malignant neoplasm of breast: Secondary | ICD-10-CM | POA: Diagnosis not present

## 2023-07-21 MED ORDER — GADOPICLENOL 0.5 MMOL/ML IV SOLN
7.0000 mL | Freq: Once | INTRAVENOUS | Status: AC | PRN
Start: 2023-07-21 — End: 2023-07-21
  Administered 2023-07-21: 7 mL via INTRAVENOUS

## 2023-08-01 ENCOUNTER — Other Ambulatory Visit (HOSPITAL_COMMUNITY): Payer: Self-pay

## 2023-08-01 ENCOUNTER — Other Ambulatory Visit: Payer: Self-pay

## 2023-08-11 ENCOUNTER — Other Ambulatory Visit (HOSPITAL_COMMUNITY): Payer: Self-pay

## 2023-08-11 ENCOUNTER — Other Ambulatory Visit: Payer: Self-pay | Admitting: Family Medicine

## 2023-08-11 MED ORDER — METOPROLOL SUCCINATE ER 25 MG PO TB24
25.0000 mg | ORAL_TABLET | Freq: Every day | ORAL | 3 refills | Status: DC
  Filled 2023-08-11: qty 30, 30d supply, fill #0
  Filled 2023-09-12: qty 30, 30d supply, fill #1
  Filled 2023-10-17: qty 30, 30d supply, fill #2
  Filled 2023-11-12: qty 30, 30d supply, fill #3

## 2023-08-12 ENCOUNTER — Other Ambulatory Visit (HOSPITAL_COMMUNITY): Payer: Self-pay

## 2023-08-16 NOTE — Assessment & Plan Note (Signed)
 Tolerating statin, encouraged heart healthy diet, avoid trans fats, minimize simple carbs and saturated fats. Increase exercise as tolerated

## 2023-08-16 NOTE — Assessment & Plan Note (Signed)
Stable on current meds, no changes. 

## 2023-08-16 NOTE — Assessment & Plan Note (Signed)
On Bupropion and Fluoxetine

## 2023-08-18 ENCOUNTER — Other Ambulatory Visit (HOSPITAL_COMMUNITY): Payer: Self-pay

## 2023-08-18 ENCOUNTER — Encounter: Payer: Self-pay | Admitting: Family Medicine

## 2023-08-18 ENCOUNTER — Ambulatory Visit: Admitting: Family Medicine

## 2023-08-18 VITALS — BP 124/78 | HR 88 | Resp 16 | Ht 59.0 in | Wt 161.6 lb

## 2023-08-18 DIAGNOSIS — F418 Other specified anxiety disorders: Secondary | ICD-10-CM

## 2023-08-18 DIAGNOSIS — E785 Hyperlipidemia, unspecified: Secondary | ICD-10-CM | POA: Diagnosis not present

## 2023-08-18 DIAGNOSIS — F909 Attention-deficit hyperactivity disorder, unspecified type: Secondary | ICD-10-CM

## 2023-08-18 DIAGNOSIS — R739 Hyperglycemia, unspecified: Secondary | ICD-10-CM

## 2023-08-18 LAB — COMPREHENSIVE METABOLIC PANEL WITH GFR
ALT: 16 U/L (ref 0–35)
AST: 21 U/L (ref 0–37)
Albumin: 4.5 g/dL (ref 3.5–5.2)
Alkaline Phosphatase: 44 U/L (ref 39–117)
BUN: 17 mg/dL (ref 6–23)
CO2: 26 meq/L (ref 19–32)
Calcium: 9.5 mg/dL (ref 8.4–10.5)
Chloride: 103 meq/L (ref 96–112)
Creatinine, Ser: 0.68 mg/dL (ref 0.40–1.20)
GFR: 106.81 mL/min (ref >60.00)
Glucose, Bld: 98 mg/dL (ref 70–99)
Potassium: 3.9 meq/L (ref 3.5–5.1)
Sodium: 138 meq/L (ref 135–145)
Total Bilirubin: 0.4 mg/dL (ref 0.2–1.2)
Total Protein: 6.6 g/dL (ref 6.0–8.3)

## 2023-08-18 LAB — TSH: TSH: 0.94 u[IU]/mL (ref 0.35–5.50)

## 2023-08-18 LAB — LIPID PANEL
Cholesterol: 200 mg/dL (ref 0–200)
HDL: 67.9 mg/dL (ref >39.00)
LDL Cholesterol: 93 mg/dL (ref 0–99)
NonHDL: 131.95
Total CHOL/HDL Ratio: 3
Triglycerides: 195 mg/dL — ABNORMAL HIGH (ref 0.0–149.0)
VLDL: 39 mg/dL (ref 0.0–40.0)

## 2023-08-18 LAB — HEMOGLOBIN A1C: Hgb A1c MFr Bld: 5.6 % (ref 4.6–6.5)

## 2023-08-18 MED ORDER — METHYLPHENIDATE HCL ER (LA) 20 MG PO CP24
20.0000 mg | ORAL_CAPSULE | ORAL | 0 refills | Status: DC
  Filled 2023-09-12 – 2023-09-13 (×2): qty 30, 30d supply, fill #0
  Filled ????-??-??: fill #0

## 2023-08-18 MED ORDER — METHYLPHENIDATE HCL ER (LA) 20 MG PO CP24
20.0000 mg | ORAL_CAPSULE | ORAL | 0 refills | Status: DC
  Filled 2023-08-18 – 2023-11-15 (×3): qty 30, 30d supply, fill #0

## 2023-08-18 MED ORDER — METHYLPHENIDATE HCL ER (LA) 20 MG PO CP24
20.0000 mg | ORAL_CAPSULE | ORAL | 0 refills | Status: DC
  Filled 2023-08-18 – 2023-10-17 (×2): qty 30, 30d supply, fill #0

## 2023-08-18 NOTE — Patient Instructions (Signed)
 Hypertension, Adult High blood pressure (hypertension) is when the force of blood pumping through the arteries is too strong. The arteries are the blood vessels that carry blood from the heart throughout the body. Hypertension forces the heart to work harder to pump blood and may cause arteries to become narrow or stiff. Untreated or uncontrolled hypertension can lead to a heart attack, heart failure, a stroke, kidney disease, and other problems. A blood pressure reading consists of a higher number over a lower number. Ideally, your blood pressure should be below 120/80. The first ("top") number is called the systolic pressure. It is a measure of the pressure in your arteries as your heart beats. The second ("bottom") number is called the diastolic pressure. It is a measure of the pressure in your arteries as the heart relaxes. What are the causes? The exact cause of this condition is not known. There are some conditions that result in high blood pressure. What increases the risk? Certain factors may make you more likely to develop high blood pressure. Some of these risk factors are under your control, including: Smoking. Not getting enough exercise or physical activity. Being overweight. Having too much fat, sugar, calories, or salt (sodium) in your diet. Drinking too much alcohol. Other risk factors include: Having a personal history of heart disease, diabetes, high cholesterol, or kidney disease. Stress. Having a family history of high blood pressure and high cholesterol. Having obstructive sleep apnea. Age. The risk increases with age. What are the signs or symptoms? High blood pressure may not cause symptoms. Very high blood pressure (hypertensive crisis) may cause: Headache. Fast or irregular heartbeats (palpitations). Shortness of breath. Nosebleed. Nausea and vomiting. Vision changes. Severe chest pain, dizziness, and seizures. How is this diagnosed? This condition is diagnosed by  measuring your blood pressure while you are seated, with your arm resting on a flat surface, your legs uncrossed, and your feet flat on the floor. The cuff of the blood pressure monitor will be placed directly against the skin of your upper arm at the level of your heart. Blood pressure should be measured at least twice using the same arm. Certain conditions can cause a difference in blood pressure between your right and left arms. If you have a high blood pressure reading during one visit or you have normal blood pressure with other risk factors, you may be asked to: Return on a different day to have your blood pressure checked again. Monitor your blood pressure at home for 1 week or longer. If you are diagnosed with hypertension, you may have other blood or imaging tests to help your health care provider understand your overall risk for other conditions. How is this treated? This condition is treated by making healthy lifestyle changes, such as eating healthy foods, exercising more, and reducing your alcohol intake. You may be referred for counseling on a healthy diet and physical activity. Your health care provider may prescribe medicine if lifestyle changes are not enough to get your blood pressure under control and if: Your systolic blood pressure is above 130. Your diastolic blood pressure is above 80. Your personal target blood pressure may vary depending on your medical conditions, your age, and other factors. Follow these instructions at home: Eating and drinking  Eat a diet that is high in fiber and potassium, and low in sodium, added sugar, and fat. An example of this eating plan is called the DASH diet. DASH stands for Dietary Approaches to Stop Hypertension. To eat this way: Eat  plenty of fresh fruits and vegetables. Try to fill one half of your plate at each meal with fruits and vegetables. Eat whole grains, such as whole-wheat pasta, brown rice, or whole-grain bread. Fill about one  fourth of your plate with whole grains. Eat or drink low-fat dairy products, such as skim milk or low-fat yogurt. Avoid fatty cuts of meat, processed or cured meats, and poultry with skin. Fill about one fourth of your plate with lean proteins, such as fish, chicken without skin, beans, eggs, or tofu. Avoid pre-made and processed foods. These tend to be higher in sodium, added sugar, and fat. Reduce your daily sodium intake. Many people with hypertension should eat less than 1,500 mg of sodium a day. Do not drink alcohol if: Your health care provider tells you not to drink. You are pregnant, may be pregnant, or are planning to become pregnant. If you drink alcohol: Limit how much you have to: 0-1 drink a day for women. 0-2 drinks a day for men. Know how much alcohol is in your drink. In the [place] one drink equals one 12 oz bottle of beer (355 mL), one 5 oz glass of wine (148 mL), or one 1 oz glass of hard liquor (44 mL). Lifestyle  Work with your health care provider to maintain a healthy body weight or to lose weight. Ask what an ideal weight is for you. Get at least 30 minutes of exercise that causes your heart to beat faster (aerobic exercise) most days of the week. Activities may include walking, swimming, or biking. Include exercise to strengthen your muscles (resistance exercise), such as Pilates or lifting weights, as part of your weekly exercise routine. Try to do these types of exercises for 30 minutes at least 3 days a week. Do not use any products that contain nicotine or tobacco. These products include cigarettes, chewing tobacco, and vaping devices, such as e-cigarettes. If you need help quitting, ask your health care provider. Monitor your blood pressure at home as told by your health care provider. Keep all follow-up visits. This is important. Medicines Take over-the-counter and prescription medicines only as told by your health care provider. Follow directions carefully. Blood  pressure medicines must be taken as prescribed. Do not skip doses of blood pressure medicine. Doing this puts you at risk for problems and can make the medicine less effective. Ask your health care provider about side effects or reactions to medicines that you should watch for. Contact a health care provider if you: Think you are having a reaction to a medicine you are taking. Have headaches that keep coming back (recurring). Feel dizzy. Have swelling in your ankles. Have trouble with your vision. Get help right away if you: Develop a severe headache or confusion. Have unusual weakness or numbness. Feel faint. Have severe pain in your chest or abdomen. Vomit repeatedly. Have trouble breathing. These symptoms may be an emergency. Get help right away. Call 911. Do not wait to see if the symptoms will go away. Do not drive yourself to the hospital. Summary Hypertension is when the force of blood pumping through your arteries is too strong. If this condition is not controlled, it may put you at risk for serious complications. Your personal target blood pressure may vary depending on your medical conditions, your age, and other factors. For most people, a normal blood pressure is less than 120/80. Hypertension is treated with lifestyle changes, medicines, or a combination of both. Lifestyle changes include losing weight, eating a healthy,  low-sodium diet, exercising more, and limiting alcohol. This information is not intended to replace advice given to you by your health care provider. Make sure you discuss any questions you have with your health care provider. Document Revised: 01/13/2021 Document Reviewed: 01/13/2021 Elsevier Patient Education  2024 [employer].

## 2023-08-18 NOTE — Progress Notes (Unsigned)
 Subjective:    Patient ID: Stacy Dennis, female    DOB: 05-09-1980, 43 y.o.   MRN: [id number]  Chief Complaint  Patient presents with  . Medical Management of Chronic Issues    Patient presents today for a 4 month follow-up.  . Quality Metric Gaps    Hep C, TDAP    HPI Discussed the use of AI scribe software for clinical note transcription with the patient, who gave verbal consent to proceed.  History of Present Illness Stacy Dennis is a 43 year old female with hypertension who presents for a follow-up on her blood pressure medication.  Her blood pressure has improved since starting medication in January, although she has not been monitoring it at home. She feels stressed due to recent events, including a stressful incident involving her dog and a [employer] delivery person in early April, which has led to dealing with insurance and workers' compensation issues.  She has been off Wellbutrin  for about three weeks and reports poor sleep with vivid dreams reminiscent of when she was pregnant. She is currently taking Prozac  and Ritalin  and stopped Wellbutrin  due to these sleep disturbances.  She has been sick for the past two weeks, with today being the first day she feels better. She describes a residual tickle in her throat but believes she is no longer infectious.  She experiences dry nasal passages, feeling like she always has 'boogers' in her nose, although she is not congested. She is hydrating well and has not noticed any significant congestion.    Past Medical History:  Diagnosis Date  . Abnormal menses 06/17/2015  . Allergic state 01/28/2012  . Anxiety 07/06/2016  . Dermatitis 06/17/2015  . Endometriosis   . H/O varicella   . Hypercholesteremia    no meds  . Hyperlipidemia 01/28/2012  . Pregnancy related carpal tunnel syndrome, antepartum 2013  . Preventative health care 01/28/2012  . Verrucous skin lesion 01/28/2012    Past Surgical History:  Procedure Laterality Date   . CESAREAN SECTION  10-10-11  . CESAREAN SECTION N/A 12/10/2013   Procedure: CESAREAN SECTION;  Surgeon: Ashby Lawman, MD;  Location: [facility];  Service: Obstetrics;  Laterality: N/A;  Repeat edc 9/27  . EYE SURGERY  2012   lasik  . MYRINGOTOMY      Family History  Problem Relation Age of Onset  . Cancer Mother   . Breast cancer Mother 52       L DCIS, lump, XRT  . Anxiety disorder Mother   . Hyperlipidemia Mother   . Hypertension Mother   . Irritable bowel syndrome Mother        ?  . Kidney disease Father        stones  . Hyperlipidemia Father   . Birth defects Maternal Aunt   . Breast cancer Maternal Aunt 60       triple negative receiving chemo now  . Thyroid  disease Maternal Aunt   . Thyroid  cancer Maternal Aunt        s/p thyroidectomy  . Thyroid  disease Paternal Aunt   . Hypertension Maternal Grandmother   . Anxiety disorder Maternal Grandmother   . Heart disease Maternal Grandfather   . Hyperlipidemia Paternal Grandmother   . Dementia Paternal Grandmother   . Other Paternal Grandmother        dementia  . Skin cancer Paternal Grandmother   . Lung cancer Paternal Grandfather        lung- smoker  . Stroke Paternal Grandfather   .  Aneurysm Paternal Grandfather   . Cerebral aneurysm Paternal Grandfather     Social History   Socioeconomic History  . Marital status: Married    Spouse name: Not on file  . Number of children: Not on file  . Years of education: Not on file  . Highest education level: Not on file  Occupational History  . Not on file  Tobacco Use  . Smoking status: Never  . Smokeless tobacco: Never  Substance and Sexual Activity  . Alcohol use: Yes    Comment: socially when not pregnant  . Drug use: No  . Sexual activity: Yes    Partners: Male    Birth control/protection: None  Other Topics Concern  . Not on file  Social History Narrative  . Not on file   Social Drivers of Health   Financial Resource Strain: Not on file  Food  Insecurity: Not on file  Transportation Needs: Not on file  Physical Activity: Not on file  Stress: Not on file  Social Connections: Not on file  Intimate Partner Violence: Not on file    Outpatient Medications Prior to Visit  Medication Sig Dispense Refill  . FLUoxetine  (PROZAC ) 20 MG tablet Take 1 tablet (20 mg total) by mouth daily. 90 tablet 1  . metoprolol  succinate (TOPROL -XL) 25 MG 24 hr tablet Take 1 tablet (25 mg total) by mouth daily. 30 tablet 3  . Norethindrone  Acetate-Ethinyl Estrad-FE (BLISOVI  24 FE) 1-20 MG-MCG(24) tablet Take 1 tablet by mouth daily. 84 tablet 3  . rosuvastatin  (CRESTOR ) 10 MG tablet Take 1 tablet (10 mg total) by mouth daily. 90 tablet 3  . [START ON 08/21/2023] methylphenidate  (RITALIN  LA) 20 MG 24 hr capsule Take 1 capsule (20 mg total) by mouth every morning. June 2025 30 capsule 0  . methylphenidate  (RITALIN  LA) 20 MG 24 hr capsule Take 1 capsule (20 mg total) by mouth every morning. 30 capsule 0  . methylphenidate  (RITALIN  LA) 20 MG 24 hr capsule Take 1 capsule (20 mg total) by mouth every morning. April 2025 30 capsule 0  . buPROPion  ER (WELLBUTRIN  SR) 100 MG 12 hr tablet Take 1 tablet (100 mg total) by mouth 2 (two) times daily. (Patient not taking: Reported on 08/18/2023) 30 tablet 1   No facility-administered medications prior to visit.    Allergies  Allergen Reactions  . Amoxicillin Nausea And Vomiting  . Lactose Intolerance (Gi)     Review of Systems  Constitutional:  Positive for malaise/fatigue. Negative for fever.  HENT:  Negative for congestion.   Eyes:  Negative for blurred vision.  Respiratory:  Negative for shortness of breath.   Cardiovascular:  Negative for chest pain, palpitations and leg swelling.  Gastrointestinal:  Negative for abdominal pain, blood in stool and nausea.  Genitourinary:  Negative for dysuria and frequency.  Musculoskeletal:  Negative for falls.  Skin:  Negative for rash.  Neurological:  Negative for  dizziness, loss of consciousness and headaches.  Endo/Heme/Allergies:  Negative for environmental allergies.  Psychiatric/Behavioral:  Negative for depression. The patient is nervous/anxious.       Objective:     Physical Exam Constitutional:      General: She is not in acute distress.    Appearance: Normal appearance. She is well-developed. She is not toxic-appearing.  HENT:     Head: Normocephalic and atraumatic.     Right Ear: External ear normal.     Left Ear: External ear normal.     Nose: Nose normal.  Eyes:  General:        Right eye: No discharge.        Left eye: No discharge.     Conjunctiva/sclera: Conjunctivae normal.  Neck:     Thyroid : No thyromegaly.  Cardiovascular:     Rate and Rhythm: Normal rate and regular rhythm.     Heart sounds: Normal heart sounds. No murmur heard. Pulmonary:     Effort: Pulmonary effort is normal. No respiratory distress.     Breath sounds: Normal breath sounds.  Abdominal:     General: Bowel sounds are normal.     Palpations: Abdomen is soft.     Tenderness: There is no abdominal tenderness. There is no guarding.  Musculoskeletal:        General: Normal range of motion.     Cervical back: Neck supple.  Lymphadenopathy:     Cervical: No cervical adenopathy.  Skin:    General: Skin is warm and dry.  Neurological:     Mental Status: She is alert and oriented to person, place, and time.  Psychiatric:        Mood and Affect: Mood normal.        Behavior: Behavior normal.        Thought Content: Thought content normal.        Judgment: Judgment normal.   BP 124/78   Pulse 88   Resp 16   Ht 4\' 11"  (1.499 m)   Wt 161 lb 9.6 oz (73.3 kg)   SpO2 96%   BMI 32.64 kg/m  Wt Readings from Last 3 Encounters:  08/18/23 161 lb 9.6 oz (73.3 kg)  04/14/23 154 lb (69.9 kg)  11/18/22 151 lb 12.8 oz (68.9 kg)    Diabetic Foot Exam - Simple   No data filed    Lab Results  Component Value Date   WBC 6.6 04/14/2023   HGB 14.4  04/14/2023   HCT 42.6 04/14/2023   PLT 394.0 04/14/2023   GLUCOSE 86 04/14/2023   CHOL 208 (H) 04/14/2023   TRIG 85.0 04/14/2023   HDL 68.90 04/14/2023   LDLDIRECT 152.7 02/03/2012   LDLCALC 122 (H) 04/14/2023   ALT 15 04/14/2023   AST 19 04/14/2023   NA 136 04/14/2023   K 4.6 04/14/2023   CL 102 04/14/2023   CREATININE 0.70 04/14/2023   BUN 11 04/14/2023   CO2 25 04/14/2023   TSH 0.93 04/14/2023   HGBA1C 5.5 11/10/2022    Lab Results  Component Value Date   TSH 0.93 04/14/2023   Lab Results  Component Value Date   WBC 6.6 04/14/2023   HGB 14.4 04/14/2023   HCT 42.6 04/14/2023   MCV 88.6 04/14/2023   PLT 394.0 04/14/2023   Lab Results  Component Value Date   NA 136 04/14/2023   K 4.6 04/14/2023   CO2 25 04/14/2023   GLUCOSE 86 04/14/2023   BUN 11 04/14/2023   CREATININE 0.70 04/14/2023   BILITOT 0.9 04/14/2023   ALKPHOS 48 04/14/2023   AST 19 04/14/2023   ALT 15 04/14/2023   PROT 6.9 04/14/2023   ALBUMIN 4.7 04/14/2023   CALCIUM  9.6 04/14/2023   GFR 106.33 04/14/2023   Lab Results  Component Value Date   CHOL 208 (H) 04/14/2023   Lab Results  Component Value Date   HDL 68.90 04/14/2023   Lab Results  Component Value Date   LDLCALC 122 (H) 04/14/2023   Lab Results  Component Value Date   TRIG 85.0 04/14/2023   Lab  Results  Component Value Date   CHOLHDL 3 04/14/2023   Lab Results  Component Value Date   HGBA1C 5.5 11/10/2022       Assessment & Plan:  Attention deficit hyperactivity disorder (ADHD), unspecified ADHD type Assessment & Plan: Stable on current meds, no changes   Depression with anxiety Assessment & Plan: On Bupropion  and Fluoxetine  she feels she is handling stress some better. She is not having outbursts but she still feels anxious. She will continue [facility] counseling and current meds. Falls asleep and stays asleep but notes she has intense dreams most nights.   Orders: -     TSH  Hyperlipidemia, unspecified  hyperlipidemia type Assessment & Plan: Tolerating statin, encouraged heart healthy diet, avoid trans fats, minimize simple carbs and saturated fats. Increase exercise as tolerated   Orders: -     Lipid panel -     TSH -     Apolipoprotein B  Hyperglycemia -     Comprehensive metabolic panel with GFR -     Hemoglobin A1c -     TSH -     Apolipoprotein B  Other orders -     Methylphenidate  HCl ER (LA); Take 1 capsule (20 mg total) by mouth every morning. July 2025  Dispense: 30 capsule; Refill: 0 -     Methylphenidate  HCl ER (LA); Take 1 capsule (20 mg total) by mouth every morning. August 2025  Dispense: 30 capsule; Refill: 0 -     Methylphenidate  HCl ER (LA); Take 1 capsule (20 mg total) by mouth every morning. September2025  Dispense: 30 capsule; Refill: 0    Assessment and Plan Assessment & Plan Sleep disturbances Sleep disturbances worsened post-bupropion  discontinuation, with vivid dreams and unrestful sleep. - Consider reintroducing bupropion  37.5 mg extended release if disturbances persist or worsen.  Depression Managed with fluoxetine . Monitoring for depressive symptoms post-bupropion  discontinuation. - Continue fluoxetine  as prescribed. - Consider reintroducing bupropion  at low dose if depressive symptoms emerge.  Attention Deficit Hyperactivity Disorder (ADHD), unspecified type ADHD symptoms managed with methylphenidate . Prescription management satisfactory. - Ensure methylphenidate  prescriptions are filled for next three months.  Primary Hypertension Blood pressure well-controlled with current regimen. Recent reading 124/78 mmHg.  Hypercholesterolemia Cholesterol levels require re-evaluation. Previous ApoB elevated. Ongoing rosuvastatin  treatment. - Order lipid panel, metabolic panel, and thyroid  function tests. - Recheck ApoB levels to monitor risk and treatment efficacy.      Randie Bustle, MD

## 2023-08-19 ENCOUNTER — Ambulatory Visit: Payer: Self-pay | Admitting: Family Medicine

## 2023-08-19 ENCOUNTER — Other Ambulatory Visit (HOSPITAL_COMMUNITY): Payer: Self-pay

## 2023-08-19 DIAGNOSIS — E785 Hyperlipidemia, unspecified: Secondary | ICD-10-CM

## 2023-08-19 MED ORDER — ROSUVASTATIN CALCIUM 20 MG PO TABS
20.0000 mg | ORAL_TABLET | Freq: Every day | ORAL | 1 refills | Status: DC
  Filled 2023-08-19 – 2023-09-12 (×2): qty 90, 90d supply, fill #0
  Filled 2023-12-28: qty 90, 90d supply, fill #1

## 2023-08-25 ENCOUNTER — Other Ambulatory Visit (HOSPITAL_COMMUNITY): Payer: Self-pay

## 2023-08-31 ENCOUNTER — Other Ambulatory Visit (HOSPITAL_COMMUNITY): Payer: Self-pay

## 2023-09-01 ENCOUNTER — Ambulatory Visit: Payer: 59 | Admitting: Family Medicine

## 2023-09-02 ENCOUNTER — Other Ambulatory Visit: Payer: 59

## 2023-09-12 ENCOUNTER — Other Ambulatory Visit (HOSPITAL_COMMUNITY): Payer: Self-pay

## 2023-09-13 ENCOUNTER — Other Ambulatory Visit (HOSPITAL_COMMUNITY): Payer: Self-pay

## 2023-09-14 ENCOUNTER — Other Ambulatory Visit (HOSPITAL_COMMUNITY): Payer: Self-pay

## 2023-09-15 ENCOUNTER — Other Ambulatory Visit (HOSPITAL_COMMUNITY): Payer: Self-pay

## 2023-09-29 ENCOUNTER — Ambulatory Visit: Payer: 59 | Admitting: Hematology and Oncology

## 2023-10-17 ENCOUNTER — Other Ambulatory Visit (HOSPITAL_COMMUNITY): Payer: Self-pay

## 2023-10-18 ENCOUNTER — Telehealth: Payer: Self-pay

## 2023-10-18 NOTE — Telephone Encounter (Signed)
 Left message to confirm appt for 7/30

## 2023-10-19 ENCOUNTER — Inpatient Hospital Stay: Payer: Self-pay | Attending: Hematology and Oncology | Admitting: Hematology and Oncology

## 2023-10-19 VITALS — BP 137/77 | HR 81 | Temp 98.6°F | Resp 17 | Wt 161.6 lb

## 2023-10-19 DIAGNOSIS — Z853 Personal history of malignant neoplasm of breast: Secondary | ICD-10-CM | POA: Diagnosis not present

## 2023-10-19 DIAGNOSIS — Z9189 Other specified personal risk factors, not elsewhere classified: Secondary | ICD-10-CM

## 2023-10-19 NOTE — Progress Notes (Signed)
 [facility] CONSULT NOTE  Patient Care Team: Domenica Harlene LABOR, MD as PCP - General (Family Medicine) Latisha Medford, MD as Consulting Physician (Obstetrics and Gynecology)  CHIEF COMPLAINTS/PURPOSE OF CONSULTATION:  At high risk for breast cancer  ASSESSMENT & PLAN:   Assessment and Plan Assessment & Plan High risk for breast cancer with personal history of malignant neoplasm of breast High risk for breast cancer persists due to personal history. Recent MRI unremarkable. Mammogram shows decreased breast density per patient. Family history significant for mother's biopsy and aunt's breast cancer treatment. Prefers alternating MRI and mammogram screenings. - Continue alternating MRI and mammogram screenings. - Request mammogram report from [facility]. - Ensure mammogram is scheduled for November. - Continue monthly self-breast exams and report any changes. - No concern for recurrence.   HISTORY OF PRESENTING ILLNESS:  Stacy Dennis 43 y.o. female is here because of high risk for [facility]. Life time risk for [facility] is 25.2% and 5 yrs risk of BC is 1.3%.  This is a very pleasant 43 year old healthy female patient with significant family history of breast cancer, negative genetic testing, lifetime risk of breast cancer over 20% estimated at 25.2% referred to high risk breast clinic for additional recommendations.  Discussed the use of AI scribe software for clinical note transcription with the patient, who gave verbal consent to proceed.  History of Present Illness Stacy Dennis is a 43 year old female who presents for follow-up regarding breast density changes and family history of breast cancer.  She recently had a mammogram indicating a change in breast density from category C to B. This is the first time her mammogram did not indicate dense breast tissue. She has been alternating between mammograms and MRIs due to her lifetime risk of breast cancer, with the last  mammogram conducted in November. She is scheduled for her next mammogram in November.  Her family history of breast cancer includes her mother, who is scheduled for a biopsy on the fifteenth of the month. Her aunt underwent chemotherapy and radiation last summer but is currently clear of cancer. This family history contributes to her ongoing monitoring and concern about breast health.  She is planning a vacation to [place] in September with her husband, while her children will stay with their grandparents in [place]  and [place] .    MEDICAL HISTORY:  Past Medical History:  Diagnosis Date   Abnormal menses 06/17/2015   Allergic state 01/28/2012   Anxiety 07/06/2016   Dermatitis 06/17/2015   Endometriosis    H/O varicella    Hypercholesteremia    no meds   Hyperlipidemia 01/28/2012   Pregnancy related carpal tunnel syndrome, antepartum 2013   Preventative health care 01/28/2012   Verrucous skin lesion 01/28/2012    SURGICAL HISTORY: Past Surgical History:  Procedure Laterality Date   CESAREAN SECTION  10-10-11   CESAREAN SECTION N/A 12/10/2013   Procedure: CESAREAN SECTION;  Surgeon: Medford Latisha, MD;  Location: [facility];  Service: Obstetrics;  Laterality: N/A;  Repeat edc 9/27   EYE SURGERY  2012   lasik   MYRINGOTOMY      SOCIAL HISTORY: Social History   Socioeconomic History   Marital status: Married    Spouse name: Not on file   Number of children: Not on file   Years of education: Not on file   Highest education level: Not on file  Occupational History   Not on file  Tobacco Use   Smoking status: Never  Smokeless tobacco: Never  Substance and Sexual Activity   Alcohol use: Yes    Comment: socially when not pregnant   Drug use: No   Sexual activity: Yes    Partners: Male    Birth control/protection: None  Other Topics Concern   Not on file  Social History Narrative   Not on file   Social Drivers of Health   Financial Resource Strain: Not on file  Food  Insecurity: Not on file  Transportation Needs: Not on file  Physical Activity: Not on file  Stress: Not on file  Social Connections: Not on file  Intimate Partner Violence: Not on file    FAMILY HISTORY: Family History  Problem Relation Age of Onset   Cancer Mother    Breast cancer Mother 67       L DCIS, lump, XRT   Anxiety disorder Mother    Hyperlipidemia Mother    Hypertension Mother    Irritable bowel syndrome Mother        ?   Kidney disease Father        stones   Hyperlipidemia Father    Birth defects Maternal Aunt    Breast cancer Maternal Aunt 60       triple negative receiving chemo now   Thyroid  disease Maternal Aunt    Thyroid  cancer Maternal Aunt        s/p thyroidectomy   Thyroid  disease Paternal Aunt    Hypertension Maternal Grandmother    Anxiety disorder Maternal Grandmother    Heart disease Maternal Grandfather    Hyperlipidemia Paternal Grandmother    Dementia Paternal Grandmother    Other Paternal Grandmother        dementia   Skin cancer Paternal Grandmother    Lung cancer Paternal Grandfather        lung- smoker   Stroke Paternal Grandfather    Aneurysm Paternal Grandfather    Cerebral aneurysm Paternal Grandfather     ALLERGIES:  is allergic to amoxicillin and lactose intolerance (gi).  MEDICATIONS:  Current Outpatient Medications  Medication Sig Dispense Refill   FLUoxetine  (PROZAC ) 20 MG tablet Take 1 tablet (20 mg total) by mouth daily. 90 tablet 1   methylphenidate  (RITALIN  LA) 20 MG 24 hr capsule Take 1 capsule (20 mg total) by mouth every morning. 30 capsule 0   methylphenidate  (RITALIN  LA) 20 MG 24 hr capsule Take 1 capsule (20 mg total) by mouth every morning. July 2025 30 capsule 0   methylphenidate  (RITALIN  LA) 20 MG 24 hr capsule Take 1 capsule (20 mg total) by mouth every morning. August 2025 30 capsule 0   metoprolol  succinate (TOPROL -XL) 25 MG 24 hr tablet Take 1 tablet (25 mg total) by mouth daily. 30 tablet 3   Norethindrone   Acetate-Ethinyl Estrad-FE (BLISOVI  24 FE) 1-20 MG-MCG(24) tablet Take 1 tablet by mouth daily. 84 tablet 3   rosuvastatin  (CRESTOR ) 20 MG tablet Take 1 tablet (20 mg total) by mouth daily. 90 tablet 1   No current facility-administered medications for this visit.     PHYSICAL EXAMINATION: ECOG PERFORMANCE STATUS: 0 - Asymptomatic  Vitals:   10/19/23 1402  BP: 137/77  Pulse: 81  Resp: 17  Temp: 98.6 F (37 C)  SpO2: 94%    Filed Weights   10/19/23 1402  Weight: 161 lb 9.6 oz (73.3 kg)     GENERAL:alert, no distress and comfortable Breast: Bilateral breast inspected and palpated, no palpable masses or regional adenopathy No lower extremity edema  LABORATORY  DATA:  I have reviewed the data as listed Lab Results  Component Value Date   WBC 6.6 04/14/2023   HGB 14.4 04/14/2023   HCT 42.6 04/14/2023   MCV 88.6 04/14/2023   PLT 394.0 04/14/2023     Chemistry      Component Value Date/Time   NA 138 08/18/2023 1136   K 3.9 08/18/2023 1136   CL 103 08/18/2023 1136   CO2 26 08/18/2023 1136   BUN 17 08/18/2023 1136   CREATININE 0.68 08/18/2023 1136   CREATININE 0.50 08/29/2013 1103      Component Value Date/Time   CALCIUM  9.5 08/18/2023 1136   ALKPHOS 44 08/18/2023 1136   AST 21 08/18/2023 1136   ALT 16 08/18/2023 1136   BILITOT 0.4 08/18/2023 1136       RADIOGRAPHIC STUDIES: I have personally reviewed the radiological images as listed and agreed with the findings in the report. No results found.  All questions were answered. The patient knows to call the clinic with any problems, questions or concerns. I spent 20 minutes in the care of this patient including H and P, review of records, counseling and coordination of care.     Amber Stalls, MD 10/19/2023 5:31 PM

## 2023-10-26 ENCOUNTER — Other Ambulatory Visit (HOSPITAL_COMMUNITY): Payer: Self-pay

## 2023-10-26 IMAGING — US US EXTREM LOW VENOUS*L*
1 series · 13 of 24 positions shown · non-contrast
Comparison: None.

CLINICAL DATA: 41-year-old female with left calf pain



[Series 1: us extrem low venous*left* · 13 of 41 slices shown]
[im 1/41]
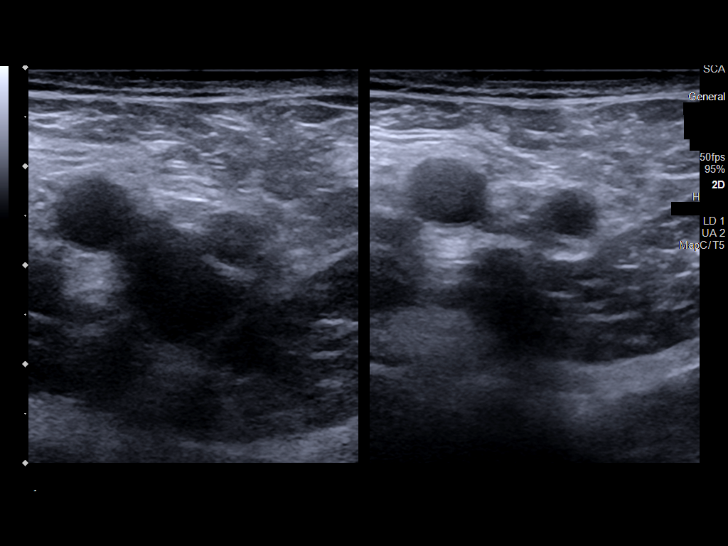
[im 4/41]
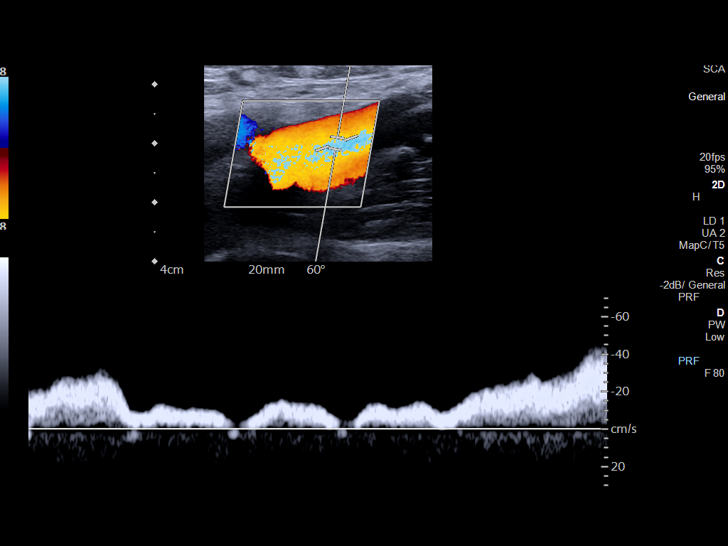
[im 7/41]
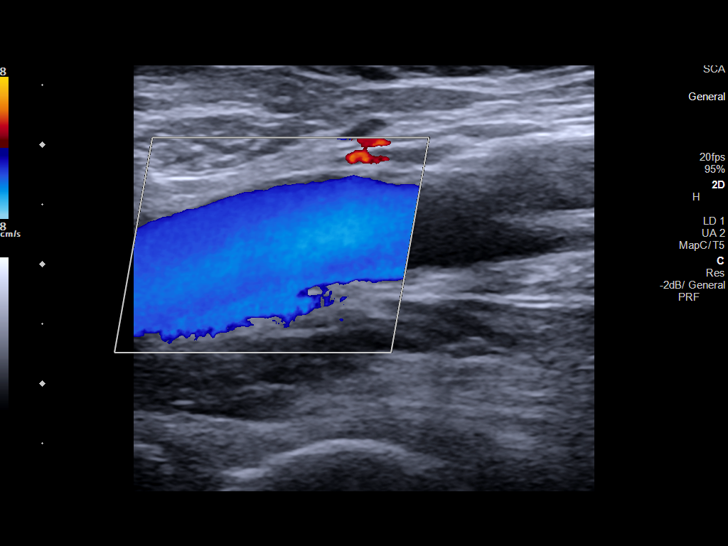
[im 11/41]
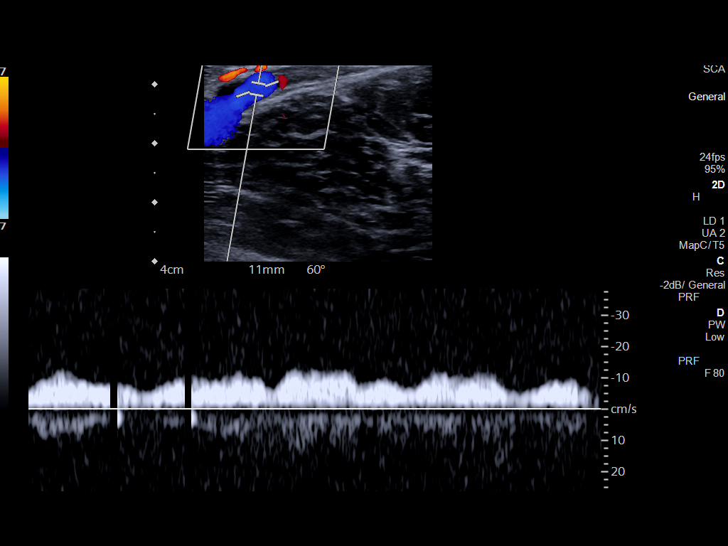
[im 14/41]
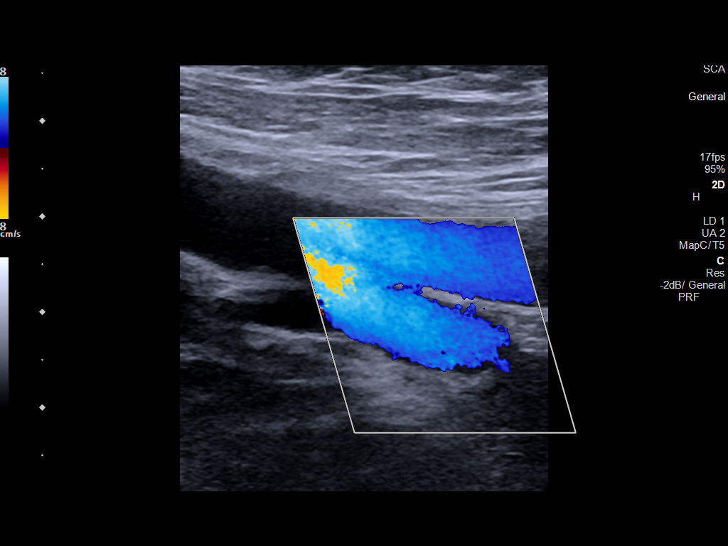
[im 18/41]
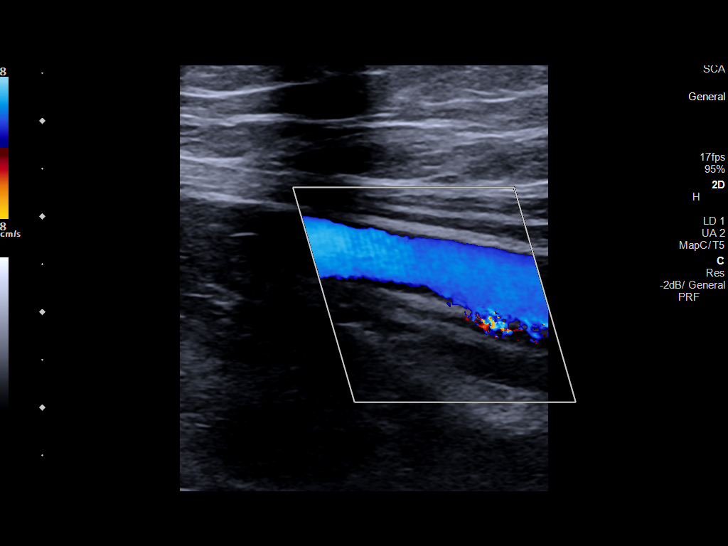
[im 21/41]
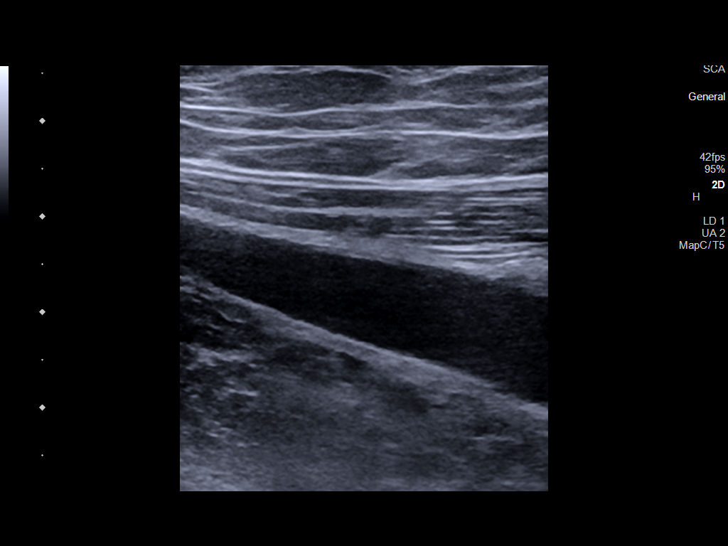
[im 23/41]
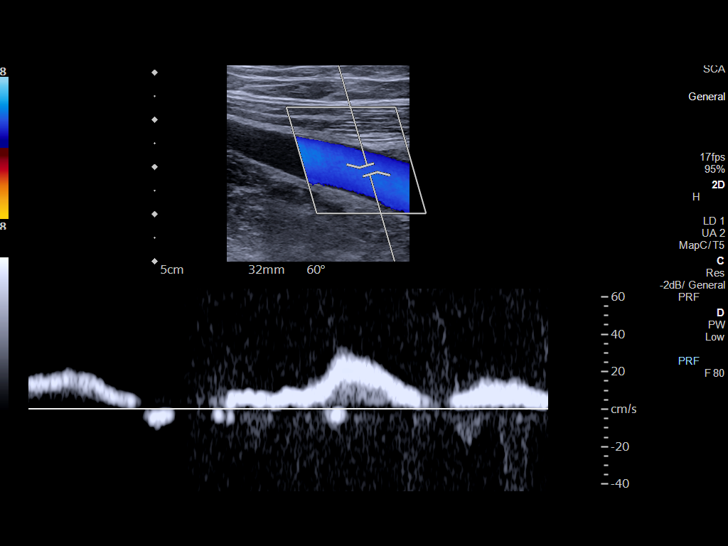
[im 27/41]
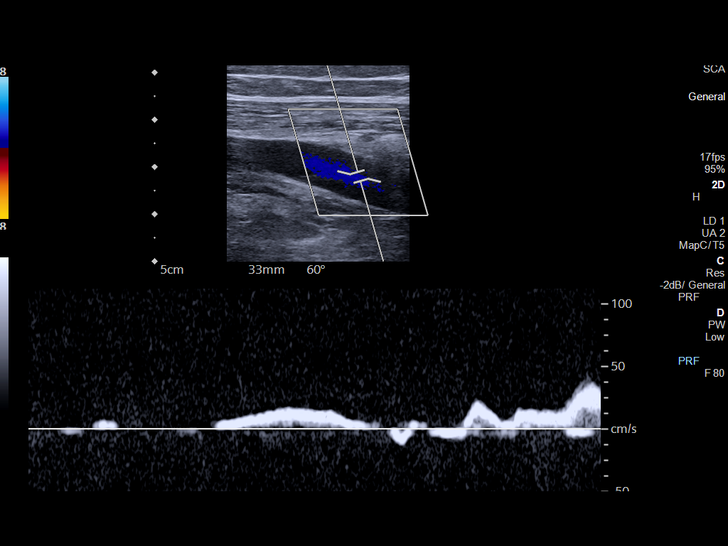
[im 30/41]
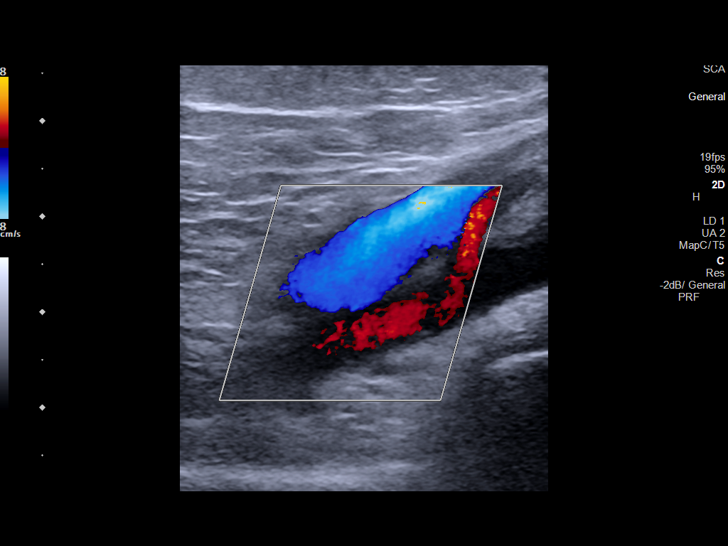
[im 34/41]
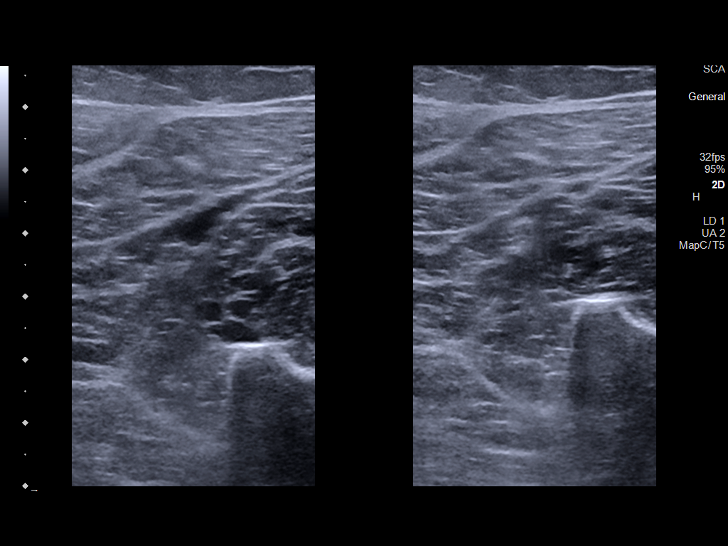
[im 37/41]
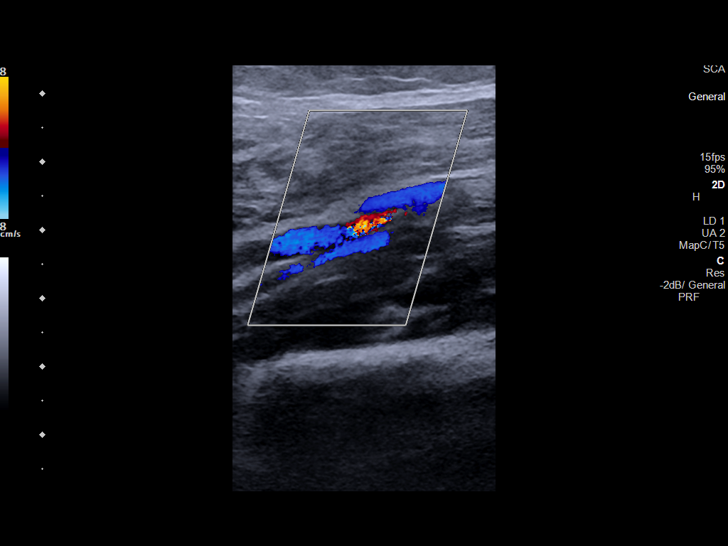
[im 41/41]
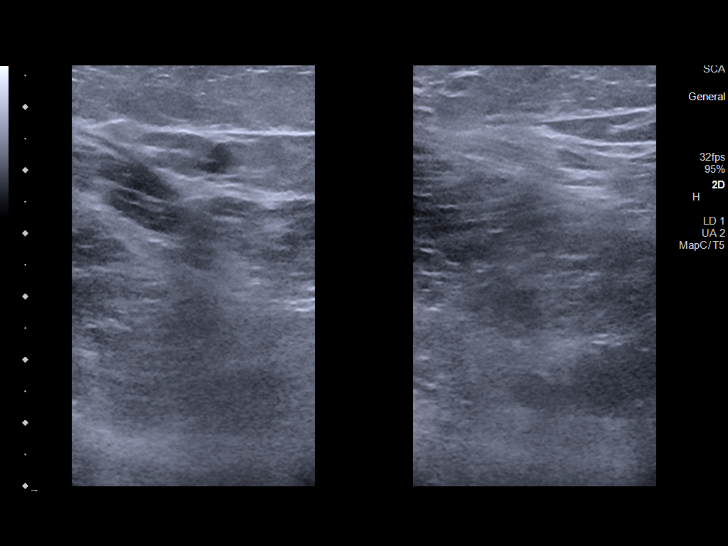

[13 of 24 positions shown; findings below may reference images not displayed]

FINDINGS: Contralateral Common Femoral Vein: Respiratory phasicity is normal
and symmetric with the symptomatic side. No evidence of thrombus.
Normal compressibility.

Common Femoral Vein: No evidence of thrombus. Normal
compressibility, respiratory phasicity and response to augmentation.

Saphenofemoral Junction: No evidence of thrombus. Normal
compressibility and flow on color Doppler imaging.

Profunda Femoral Vein: No evidence of thrombus. Normal
compressibility and flow on color Doppler imaging.

Femoral Vein: No evidence of thrombus. Normal compressibility,
respiratory phasicity and response to augmentation.

Popliteal Vein: No evidence of thrombus. Normal compressibility,
respiratory phasicity and response to augmentation.

Calf Veins: No evidence of thrombus. Normal compressibility and flow
on color Doppler imaging.

Superficial Great Saphenous Vein: No evidence of thrombus. Normal
compressibility and flow on color Doppler imaging.

Other Findings:  None.
IMPRESSION: Directed duplex of the left lower extremity negative for DVT

## 2023-11-04 ENCOUNTER — Encounter: Payer: Self-pay | Admitting: Obstetrics and Gynecology

## 2023-11-12 ENCOUNTER — Other Ambulatory Visit: Payer: Self-pay

## 2023-11-14 ENCOUNTER — Other Ambulatory Visit (HOSPITAL_COMMUNITY): Payer: Self-pay

## 2023-11-15 ENCOUNTER — Other Ambulatory Visit (HOSPITAL_COMMUNITY): Payer: Self-pay

## 2023-12-08 ENCOUNTER — Encounter: Payer: 59 | Admitting: Family Medicine

## 2023-12-11 NOTE — Assessment & Plan Note (Signed)
Stable on current meds, no changes. 

## 2023-12-11 NOTE — Assessment & Plan Note (Signed)
 Tolerating statin, encouraged heart healthy diet, avoid trans fats, minimize simple carbs and saturated fats. Increase exercise as tolerated

## 2023-12-11 NOTE — Assessment & Plan Note (Signed)
 On Bupropion  and Fluoxetine  she feels she is handling stress some better. She is not having outbursts but she still feels anxious. She will continue [facility] counseling and current meds.

## 2023-12-12 ENCOUNTER — Encounter: Payer: Self-pay | Admitting: Family Medicine

## 2023-12-12 ENCOUNTER — Ambulatory Visit: Payer: Self-pay | Admitting: Family Medicine

## 2023-12-12 ENCOUNTER — Other Ambulatory Visit (HOSPITAL_COMMUNITY): Payer: Self-pay

## 2023-12-12 ENCOUNTER — Ambulatory Visit: Admitting: Family Medicine

## 2023-12-12 VITALS — BP 128/84 | HR 69 | Temp 97.9°F | Resp 16 | Ht 59.0 in | Wt 159.6 lb

## 2023-12-12 DIAGNOSIS — F418 Other specified anxiety disorders: Secondary | ICD-10-CM | POA: Diagnosis not present

## 2023-12-12 DIAGNOSIS — R739 Hyperglycemia, unspecified: Secondary | ICD-10-CM

## 2023-12-12 DIAGNOSIS — Z0000 Encounter for general adult medical examination without abnormal findings: Secondary | ICD-10-CM

## 2023-12-12 DIAGNOSIS — E785 Hyperlipidemia, unspecified: Secondary | ICD-10-CM | POA: Diagnosis not present

## 2023-12-12 DIAGNOSIS — F909 Attention-deficit hyperactivity disorder, unspecified type: Secondary | ICD-10-CM

## 2023-12-12 DIAGNOSIS — Z23 Encounter for immunization: Secondary | ICD-10-CM

## 2023-12-12 LAB — LIPID PANEL
Cholesterol: 174 mg/dL (ref 0–200)
HDL: 60.5 mg/dL (ref >39.00)
LDL Cholesterol: 89 mg/dL (ref 0–99)
NonHDL: 113.47
Total CHOL/HDL Ratio: 3
Triglycerides: 124 mg/dL (ref 0.0–149.0)
VLDL: 24.8 mg/dL (ref 0.0–40.0)

## 2023-12-12 LAB — COMPREHENSIVE METABOLIC PANEL WITH GFR
ALT: 18 U/L (ref 0–35)
AST: 22 U/L (ref 0–37)
Albumin: 4.5 g/dL (ref 3.5–5.2)
Alkaline Phosphatase: 44 U/L (ref 39–117)
BUN: 9 mg/dL (ref 6–23)
CO2: 25 meq/L (ref 19–32)
Calcium: 9.7 mg/dL (ref 8.4–10.5)
Chloride: 103 meq/L (ref 96–112)
Creatinine, Ser: 0.63 mg/dL (ref 0.40–1.20)
GFR: 108.55 mL/min (ref >60.00)
Glucose, Bld: 86 mg/dL (ref 70–99)
Potassium: 4.8 meq/L (ref 3.5–5.1)
Sodium: 137 meq/L (ref 135–145)
Total Bilirubin: 0.6 mg/dL (ref 0.2–1.2)
Total Protein: 6.8 g/dL (ref 6.0–8.3)

## 2023-12-12 LAB — HEMOGLOBIN A1C: Hgb A1c MFr Bld: 5.9 % (ref 4.6–6.5)

## 2023-12-12 LAB — CBC WITH DIFFERENTIAL/PLATELET
Basophils Absolute: 0.1 K/uL (ref 0.0–0.1)
Basophils Relative: 0.8 % (ref 0.0–3.0)
Eosinophils Absolute: 0.1 K/uL (ref 0.0–0.7)
Eosinophils Relative: 1.6 % (ref 0.0–5.0)
HCT: 40.6 % (ref 36.0–46.0)
Hemoglobin: 13.7 g/dL (ref 12.0–15.0)
Lymphocytes Relative: 23.9 % (ref 12.0–46.0)
Lymphs Abs: 2.2 K/uL (ref 0.7–4.0)
MCHC: 33.7 g/dL (ref 30.0–36.0)
MCV: 88.1 fl (ref 78.0–100.0)
Monocytes Absolute: 0.4 K/uL (ref 0.1–1.0)
Monocytes Relative: 4.8 % (ref 3.0–12.0)
Neutro Abs: 6.3 K/uL (ref 1.4–7.7)
Neutrophils Relative %: 68.9 % (ref 43.0–77.0)
Platelets: 385 K/uL (ref 150.0–400.0)
RBC: 4.6 Mil/uL (ref 3.87–5.11)
RDW: 12.2 % (ref 11.5–15.5)
WBC: 9.1 K/uL (ref 4.0–10.5)

## 2023-12-12 LAB — TSH: TSH: 1.15 u[IU]/mL (ref 0.35–5.50)

## 2023-12-12 MED ORDER — METHYLPHENIDATE HCL ER (LA) 20 MG PO CP24
20.0000 mg | ORAL_CAPSULE | ORAL | 0 refills | Status: DC
  Filled 2023-12-12 – 2024-03-14 (×2): qty 30, 30d supply, fill #0

## 2023-12-12 MED ORDER — METHYLPHENIDATE HCL ER (LA) 20 MG PO CP24
20.0000 mg | ORAL_CAPSULE | ORAL | 0 refills | Status: AC
  Filled 2023-12-12 – 2023-12-21 (×2): qty 30, 30d supply, fill #0
  Filled ????-??-??: fill #0

## 2023-12-12 MED ORDER — METHYLPHENIDATE HCL ER (LA) 20 MG PO CP24
20.0000 mg | ORAL_CAPSULE | ORAL | 0 refills | Status: AC
  Filled 2023-12-12 – 2024-01-26 (×2): qty 30, 30d supply, fill #0

## 2023-12-12 MED ORDER — METOPROLOL SUCCINATE ER 25 MG PO TB24
25.0000 mg | ORAL_TABLET | Freq: Every day | ORAL | 3 refills | Status: AC
  Filled 2023-12-12: qty 90, 90d supply, fill #0
  Filled 2024-03-14: qty 90, 90d supply, fill #1

## 2023-12-12 MED ORDER — FLUOXETINE HCL 20 MG PO TABS
20.0000 mg | ORAL_TABLET | Freq: Every day | ORAL | 3 refills | Status: AC
  Filled 2023-12-12: qty 90, 90d supply, fill #0
  Filled 2024-03-14: qty 90, 90d supply, fill #1

## 2023-12-12 MED ORDER — BLISOVI 24 FE 1-20 MG-MCG(24) PO TABS
1.0000 | ORAL_TABLET | Freq: Every day | ORAL | 3 refills | Status: AC
  Filled 2023-12-12 – 2024-04-04 (×3): qty 84, 84d supply, fill #0

## 2023-12-12 NOTE — Patient Instructions (Addendum)
 CBTinsomnia APP from [place] ([employer])  ACP documents to us  thru mychart  Encouraged good sleep hygiene such as dark, quiet room. No blue/green glowing lights such as computer screens in bedroom. No alcohol or stimulants in evening. Cut down on caffeine as able. Regular exercise is helpful but not just prior to bed time.     Preventive Care 87-43 Years Old, Female Preventive care refers to lifestyle choices and visits with your health care provider that can promote health and wellness. Preventive care visits are also called wellness exams. What can I expect for my preventive care visit? Counseling During your preventive care visit, your health care provider may ask about your: Medical history, including: Past medical problems. Family medical history. Pregnancy history. Current health, including: Menstrual cycle. Method of birth control. Emotional well-being. Home life and relationship well-being. Sexual activity and sexual health. Lifestyle, including: Alcohol, nicotine or tobacco, and drug use. Access to firearms. Diet, exercise, and sleep habits. Work and work Astronomer. Sunscreen use. Safety issues such as seatbelt and bike helmet use. Physical exam Your health care provider may check your: Height and weight. These may be used to calculate your BMI (body mass index). BMI is a measurement that tells if you are at a healthy weight. Waist circumference. This measures the distance around your waistline. This measurement also tells if you are at a healthy weight and may help predict your risk of certain diseases, such as type 2 diabetes and high blood pressure. Heart rate and blood pressure. Body temperature. Skin for abnormal spots. What immunizations do I need?  Vaccines are usually given at various ages, according to a schedule. Your health care provider will recommend vaccines for you based on your age, medical history, and lifestyle or other factors, such as  travel or where you work. What tests do I need? Screening Your health care provider may recommend screening tests for certain conditions. This may include: Pelvic exam and Pap test. Lipid and cholesterol levels. Diabetes screening. This is done by checking your blood sugar (glucose) after you have not eaten for a while (fasting). Hepatitis B test. Hepatitis C test. HIV (human immunodeficiency virus) test. STI (sexually transmitted infection) testing, if you are at risk. BRCA-related cancer screening. This may be done if you have a family history of breast, ovarian, tubal, or peritoneal cancers. Talk with your health care provider about your test results, treatment options, and if necessary, the need for more tests. Follow these instructions at home: Eating and drinking  Eat a healthy diet that includes fresh fruits and vegetables, whole grains, lean protein, and low-fat dairy products. Take vitamin and mineral supplements as recommended by your health care provider. Do not drink alcohol if: Your health care provider tells you not to drink. You are pregnant, may be pregnant, or are planning to become pregnant. If you drink alcohol: Limit how much you have to 0-1 drink a day. Know how much alcohol is in your drink. In the [place] one drink equals one 12 oz bottle of beer (355 mL), one 5 oz glass of wine (148 mL), or one 1 oz glass of hard liquor (44 mL). Lifestyle Brush your teeth every morning and night with fluoride toothpaste. Floss one time each day. Exercise for at least 30 minutes 5 or more days each week. Do not use any products that contain nicotine or tobacco. These products include cigarettes, chewing tobacco, and vaping devices, such as e-cigarettes. If you need help quitting, ask your health care provider. Do  not use drugs. If you are sexually active, practice safe sex. Use a condom or other form of protection to prevent STIs. If you do not wish to become pregnant, use a form  of birth control. If you plan to become pregnant, see your health care provider for a prepregnancy visit. Find healthy ways to manage stress, such as: Meditation, yoga, or listening to music. Journaling. Talking to a trusted person. Spending time with friends and family. Minimize exposure to UV radiation to reduce your risk of skin cancer. Safety Always wear your seat belt while driving or riding in a vehicle. Do not drive: If you have been drinking alcohol. Do not ride with someone who has been drinking. If you have been using any mind-altering substances or drugs. While texting. When you are tired or distracted. Wear a helmet and other protective equipment during sports activities. If you have firearms in your house, make sure you follow all gun safety procedures. Seek help if you have been physically or sexually abused. What's next? Go to your health care provider once a year for an annual wellness visit. Ask your health care provider how often you should have your eyes and teeth checked. Stay up to date on all vaccines. This information is not intended to replace advice given to you by your health care provider. Make sure you discuss any questions you have with your health care provider. Document Revised: 09/03/2020 Document Reviewed: 09/03/2020 Elsevier Patient Education  2024 [employer].

## 2023-12-12 NOTE — Assessment & Plan Note (Signed)
 Patient encouraged to maintain heart healthy diet, regular exercise, adequate sleep. Consider daily probiotics. Take medications as prescribed. Labs ordered and reviewed. Pap with OB/GYN will request most recent [facility] 11/24 repeat every one to two years Screening colonoscopy schedule starts at 43 yo

## 2023-12-12 NOTE — Progress Notes (Signed)
 Subjective:    Patient ID: Stacy Dennis, female    DOB: March 09, 1981, 43 y.o.   MRN: [id number]  Chief Complaint  Patient presents with   Annual Exam    Patient presents today for physical exam.   Quality Metric Gaps    Hep C screening, TDAP,HPV,Hep B vaccines    HPI Discussed the use of AI scribe software for clinical note transcription with the patient, who gave verbal consent to proceed.  History of Present Illness Stacy Dennis is a 43 year old female who presents for a follow-up regarding her current medication regimen and overall health management.  She feels stable on her current medication regimen, which includes fluoxetine , methylphenidate , and metoprolol . She previously discontinued bupropion  and feels that fluoxetine  has helped stabilize her mood. She experiences worry, particularly about her children, but it is not as consuming as before. Her lifestyle is busy, with responsibilities related to her children's activities, contributing to a constant feeling of being 'on edge' but not anxious.  She typically sleeps five and a half to six and a half hours per night, with better sleep quality since discontinuing bupropion . She experiences fewer vivid dreams and feels her sleep is more restful, although she still struggles with 'doom scrolling' before bed. She reports better sleep during a recent trip to [place], despite it being fragmented.  Her family history includes breast cancer in her mother and maternal aunt. Her mother is currently 69 years old and her aunt was diagnosed at 26. She has two sons involved in travel sports, which adds to her busy schedule.  No new gastrointestinal issues and regular bowel movements.    Past Medical History:  Diagnosis Date   Abnormal menses 06/17/2015   Allergic state 01/28/2012   Anxiety 07/06/2016   Dermatitis 06/17/2015   Endometriosis    H/O varicella    Hypercholesteremia    no meds   Hyperlipidemia 01/28/2012   Pregnancy related  carpal tunnel syndrome, antepartum 2013   Preventative health care 01/28/2012   Verrucous skin lesion 01/28/2012    Past Surgical History:  Procedure Laterality Date   CESAREAN SECTION  10-10-11   CESAREAN SECTION N/A 12/10/2013   Procedure: CESAREAN SECTION;  Surgeon: Truman Corona, MD;  Location: [facility];  Service: Obstetrics;  Laterality: N/A;  Repeat edc 9/27   EYE SURGERY  2012   lasik   MYRINGOTOMY      Family History  Problem Relation Age of Onset   Cancer Mother    Breast cancer Mother 31       L DCIS, lump, XRT   Anxiety disorder Mother    Hyperlipidemia Mother    Hypertension Mother    Irritable bowel syndrome Mother        ?   Kidney disease Father        stones   Hyperlipidemia Father    Birth defects Maternal Aunt    Breast cancer Maternal Aunt 64       triple negative receiving chemo now   Thyroid  disease Maternal Aunt    Thyroid  cancer Maternal Aunt        s/p thyroidectomy   Thyroid  disease Paternal Aunt    Hypertension Maternal Grandmother    Anxiety disorder Maternal Grandmother    Heart disease Maternal Grandfather    Hyperlipidemia Paternal Grandmother    Dementia Paternal Grandmother    Other Paternal Grandmother        dementia   Skin cancer Paternal Grandmother  Lung cancer Paternal Grandfather        lung- smoker   Stroke Paternal Grandfather    Aneurysm Paternal Grandfather    Cerebral aneurysm Paternal Grandfather     Social History   Socioeconomic History   Marital status: Married    Spouse name: Not on file   Number of children: Not on file   Years of education: Not on file   Highest education level: Not on file  Occupational History   Not on file  Tobacco Use   Smoking status: Never   Smokeless tobacco: Never  Substance and Sexual Activity   Alcohol use: Yes    Comment: socially when not pregnant   Drug use: No   Sexual activity: Yes    Partners: Male    Birth control/protection: None  Other Topics Concern   Not on  file  Social History Narrative   Not on file   Social Drivers of Health   Financial Resource Strain: Not on file  Food Insecurity: Not on file  Transportation Needs: Not on file  Physical Activity: Not on file  Stress: Not on file  Social Connections: Not on file  Intimate Partner Violence: Not on file    Outpatient Medications Prior to Visit  Medication Sig Dispense Refill   FLUoxetine  (PROZAC ) 20 MG tablet Take 1 tablet (20 mg total) by mouth daily. 90 tablet 1   methylphenidate  (RITALIN  LA) 20 MG 24 hr capsule Take 1 capsule (20 mg total) by mouth every morning. 30 capsule 0   methylphenidate  (RITALIN  LA) 20 MG 24 hr capsule Take 1 capsule (20 mg total) by mouth every morning. July 2025 30 capsule 0   methylphenidate  (RITALIN  LA) 20 MG 24 hr capsule Take 1 capsule (20 mg total) by mouth every morning. August 2025 30 capsule 0   metoprolol  succinate (TOPROL -XL) 25 MG 24 hr tablet Take 1 tablet (25 mg total) by mouth daily. 30 tablet 3   Norethindrone  Acetate-Ethinyl Estrad-FE (BLISOVI  24 FE) 1-20 MG-MCG(24) tablet Take 1 tablet by mouth daily. 84 tablet 3   rosuvastatin  (CRESTOR ) 20 MG tablet Take 1 tablet (20 mg total) by mouth daily. 90 tablet 1   No facility-administered medications prior to visit.    Allergies  Allergen Reactions   Amoxicillin Nausea And Vomiting   Lactose Intolerance (Gi)     Review of Systems  Constitutional:  Positive for malaise/fatigue. Negative for chills and fever.  HENT:  Negative for congestion and hearing loss.   Eyes:  Negative for discharge.  Respiratory:  Negative for cough, sputum production and shortness of breath.   Cardiovascular:  Negative for chest pain, palpitations and leg swelling.  Gastrointestinal:  Negative for abdominal pain, blood in stool, constipation, diarrhea, heartburn, nausea and vomiting.  Genitourinary:  Negative for dysuria, frequency, hematuria and urgency.  Musculoskeletal:  Negative for back pain, falls and  myalgias.  Skin:  Negative for rash.  Neurological:  Negative for dizziness, sensory change, loss of consciousness, weakness and headaches.  Endo/Heme/Allergies:  Negative for environmental allergies. Does not bruise/bleed easily.  Psychiatric/Behavioral:  Negative for depression and suicidal ideas. The patient is nervous/anxious. The patient does not have insomnia.        Objective:    Physical Exam Constitutional:      General: She is not in acute distress.    Appearance: Normal appearance. She is not diaphoretic.  HENT:     Head: Normocephalic and atraumatic.     Right Ear: Tympanic membrane, ear canal  and external ear normal.     Left Ear: Tympanic membrane, ear canal and external ear normal.     Nose: Nose normal.     Mouth/Throat:     Mouth: Mucous membranes are moist.     Pharynx: Oropharynx is clear. No oropharyngeal exudate.  Eyes:     General: No scleral icterus.       Right eye: No discharge.        Left eye: No discharge.     Conjunctiva/sclera: Conjunctivae normal.     Pupils: Pupils are equal, round, and reactive to light.  Neck:     Thyroid : No thyromegaly.  Cardiovascular:     Rate and Rhythm: Normal rate and regular rhythm.     Heart sounds: Normal heart sounds. No murmur heard. Pulmonary:     Effort: Pulmonary effort is normal. No respiratory distress.     Breath sounds: Normal breath sounds. No wheezing or rales.  Abdominal:     General: Bowel sounds are normal. There is no distension.     Palpations: Abdomen is soft. There is no mass.     Tenderness: There is no abdominal tenderness.  Musculoskeletal:        General: No tenderness. Normal range of motion.     Cervical back: Normal range of motion and neck supple.  Lymphadenopathy:     Cervical: No cervical adenopathy.  Skin:    General: Skin is warm and dry.     Findings: No rash.  Neurological:     General: No focal deficit present.     Mental Status: She is alert and oriented to person, place,  and time.     Cranial Nerves: No cranial nerve deficit.     Coordination: Coordination normal.     Deep Tendon Reflexes: Reflexes are normal and symmetric. Reflexes normal.  Psychiatric:        Mood and Affect: Mood normal.        Behavior: Behavior normal.        Thought Content: Thought content normal.        Judgment: Judgment normal.     BP 128/84   Pulse 69   Temp 97.9 F (36.6 C)   Resp 16   Ht 4' 11 (1.499 m)   Wt 159 lb 9.6 oz (72.4 kg)   SpO2 98%   BMI 32.24 kg/m  Wt Readings from Last 3 Encounters:  12/12/23 159 lb 9.6 oz (72.4 kg)  10/19/23 161 lb 9.6 oz (73.3 kg)  08/18/23 161 lb 9.6 oz (73.3 kg)    Diabetic Foot Exam - Simple   No data filed    Lab Results  Component Value Date   WBC 6.6 04/14/2023   HGB 14.4 04/14/2023   HCT 42.6 04/14/2023   PLT 394.0 04/14/2023   GLUCOSE 98 08/18/2023   CHOL 200 08/18/2023   TRIG 195.0 (H) 08/18/2023   HDL 67.90 08/18/2023   LDLDIRECT 152.7 02/03/2012   LDLCALC 93 08/18/2023   ALT 16 08/18/2023   AST 21 08/18/2023   NA 138 08/18/2023   K 3.9 08/18/2023   CL 103 08/18/2023   CREATININE 0.68 08/18/2023   BUN 17 08/18/2023   CO2 26 08/18/2023   TSH 0.94 08/18/2023   HGBA1C 5.6 08/18/2023    Lab Results  Component Value Date   TSH 0.94 08/18/2023   Lab Results  Component Value Date   WBC 6.6 04/14/2023   HGB 14.4 04/14/2023   HCT 42.6 04/14/2023  MCV 88.6 04/14/2023   PLT 394.0 04/14/2023   Lab Results  Component Value Date   NA 138 08/18/2023   K 3.9 08/18/2023   CO2 26 08/18/2023   GLUCOSE 98 08/18/2023   BUN 17 08/18/2023   CREATININE 0.68 08/18/2023   BILITOT 0.4 08/18/2023   ALKPHOS 44 08/18/2023   AST 21 08/18/2023   ALT 16 08/18/2023   PROT 6.6 08/18/2023   ALBUMIN 4.5 08/18/2023   CALCIUM  9.5 08/18/2023   GFR 106.81 08/18/2023   Lab Results  Component Value Date   CHOL 200 08/18/2023   Lab Results  Component Value Date   HDL 67.90 08/18/2023   Lab Results  Component  Value Date   LDLCALC 93 08/18/2023   Lab Results  Component Value Date   TRIG 195.0 (H) 08/18/2023   Lab Results  Component Value Date   CHOLHDL 3 08/18/2023   Lab Results  Component Value Date   HGBA1C 5.6 08/18/2023       Assessment & Plan:  Hyperlipidemia, unspecified hyperlipidemia type Assessment & Plan: Tolerating statin, encouraged heart healthy diet, avoid trans fats, minimize simple carbs and saturated fats. Increase exercise as tolerated    Depression with anxiety Assessment & Plan: On Bupropion  and Fluoxetine  she feels she is handling stress some better. She is not having outbursts but she still feels anxious. She will continue [facility] counseling and current meds.    Attention deficit hyperactivity disorder (ADHD), unspecified ADHD type Assessment & Plan: Stable on current meds, no changes   Hyperglycemia  Preventative health care Assessment & Plan: Patient encouraged to maintain heart healthy diet, regular exercise, adequate sleep. Consider daily probiotics. Take medications as prescribed. Labs ordered and reviewed. Pap with OB/GYN will request most recent [facility] 11/24 repeat every one to two years Screening colonoscopy schedule starts at 43 yo     Assessment and Plan Assessment & Plan Adult Wellness Visit Routine wellness visit with no new significant health issues reported. - Order blood work to check cholesterol levels - Request last Pap smear from Dr. Valma - Administer tetanus booster - Encourage annual influenza and COVID-19 vaccinations - Discussed the importance of uploading advance directives to MyChart  Depression with anxiety Depression and anxiety are well-managed on fluoxetine . She reports reduced worry and enjoyment in activities, though stress from daily responsibilities persists. - Continue fluoxetine  20 mg daily - Refill fluoxetine  for 90 days  Attention-deficit hyperactivity disorder (ADHD) ADHD symptoms are well-managed with  methylphenidate . No changes in symptoms reported. - Continue methylphenidate  20 mg every morning - Prescribe methylphenidate  for October, November, and December  Primary hypertension Hypertension is well-controlled on metoprolol  succinate. The patient reports sleep disturbances related to lifestyle and responsibilities, but no new symptoms of hypertension. - Continue metoprolol  succinate 25 mg daily - Refill metoprolol  for 90 days  Recording duration: 28 minutes     Harlene Horton, MD

## 2023-12-14 LAB — DRUG MONITORING PANEL 376104, URINE
Amphetamines: NEGATIVE ng/mL (ref <500)
Barbiturates: NEGATIVE ng/mL (ref <300)
Benzodiazepines: NEGATIVE ng/mL (ref <100)
Cocaine Metabolite: NEGATIVE ng/mL (ref <150)
Desmethyltramadol: NEGATIVE ng/mL (ref <100)
Opiates: NEGATIVE ng/mL (ref <100)
Oxycodone: NEGATIVE ng/mL (ref <100)
Tramadol: NEGATIVE ng/mL (ref <100)

## 2023-12-14 LAB — DM TEMPLATE

## 2023-12-16 ENCOUNTER — Encounter: Payer: Self-pay | Admitting: *Deleted

## 2023-12-16 ENCOUNTER — Other Ambulatory Visit (HOSPITAL_COMMUNITY): Payer: Self-pay

## 2023-12-21 ENCOUNTER — Other Ambulatory Visit (HOSPITAL_COMMUNITY): Payer: Self-pay

## 2023-12-22 ENCOUNTER — Encounter: Payer: 59 | Admitting: Family Medicine

## 2024-01-17 ENCOUNTER — Other Ambulatory Visit (HOSPITAL_COMMUNITY): Payer: Self-pay

## 2024-01-26 ENCOUNTER — Other Ambulatory Visit (HOSPITAL_COMMUNITY): Payer: Self-pay

## 2024-02-09 ENCOUNTER — Other Ambulatory Visit (HOSPITAL_COMMUNITY): Payer: Self-pay

## 2024-02-09 DIAGNOSIS — Z1231 Encounter for screening mammogram for malignant neoplasm of breast: Secondary | ICD-10-CM | POA: Diagnosis not present

## 2024-02-09 DIAGNOSIS — Z6833 Body mass index (BMI) 33.0-33.9, adult: Secondary | ICD-10-CM | POA: Diagnosis not present

## 2024-02-09 DIAGNOSIS — Z1151 Encounter for screening for human papillomavirus (HPV): Secondary | ICD-10-CM | POA: Diagnosis not present

## 2024-02-09 DIAGNOSIS — Z3040 Encounter for surveillance of contraceptives, unspecified: Secondary | ICD-10-CM | POA: Diagnosis not present

## 2024-02-09 DIAGNOSIS — Z01419 Encounter for gynecological examination (general) (routine) without abnormal findings: Secondary | ICD-10-CM | POA: Diagnosis not present

## 2024-02-09 DIAGNOSIS — Z124 Encounter for screening for malignant neoplasm of cervix: Secondary | ICD-10-CM | POA: Diagnosis not present

## 2024-02-09 MED ORDER — BLISOVI 24 FE 1-20 MG-MCG(24) PO TABS
1.0000 | ORAL_TABLET | Freq: Every day | ORAL | 3 refills | Status: AC
  Filled 2024-02-09: qty 84, 84d supply, fill #0

## 2024-03-14 ENCOUNTER — Other Ambulatory Visit: Payer: Self-pay

## 2024-03-14 ENCOUNTER — Other Ambulatory Visit (HOSPITAL_COMMUNITY): Payer: Self-pay

## 2024-03-26 ENCOUNTER — Other Ambulatory Visit (HOSPITAL_COMMUNITY): Payer: Self-pay

## 2024-03-26 DIAGNOSIS — E785 Hyperlipidemia, unspecified: Secondary | ICD-10-CM

## 2024-03-26 MED ORDER — ROSUVASTATIN CALCIUM 20 MG PO TABS
20.0000 mg | ORAL_TABLET | Freq: Every day | ORAL | 1 refills | Status: AC
  Filled 2024-03-26: qty 90, 90d supply, fill #0

## 2024-03-27 ENCOUNTER — Other Ambulatory Visit (HOSPITAL_COMMUNITY): Payer: Self-pay

## 2024-04-04 ENCOUNTER — Other Ambulatory Visit (HOSPITAL_COMMUNITY): Payer: Self-pay

## 2024-04-04 ENCOUNTER — Other Ambulatory Visit: Payer: Self-pay

## 2024-04-05 ENCOUNTER — Other Ambulatory Visit: Payer: Self-pay

## 2024-04-05 ENCOUNTER — Other Ambulatory Visit (HOSPITAL_COMMUNITY): Payer: Self-pay

## 2024-04-09 ENCOUNTER — Other Ambulatory Visit (HOSPITAL_BASED_OUTPATIENT_CLINIC_OR_DEPARTMENT_OTHER): Payer: Self-pay

## 2024-04-22 ENCOUNTER — Other Ambulatory Visit: Payer: Self-pay

## 2024-04-27 ENCOUNTER — Other Ambulatory Visit: Payer: Self-pay | Admitting: Family Medicine

## 2024-04-27 MED ORDER — METHYLPHENIDATE HCL ER (LA) 20 MG PO CP24
20.0000 mg | ORAL_CAPSULE | ORAL | 0 refills | Status: AC
  Filled 2024-04-27: qty 30, 30d supply, fill #0

## 2024-04-27 NOTE — Telephone Encounter (Signed)
 Requesting: methylphenidate   Contract:12/12/23 UDS: 12/12/23 Last Visit:12/12/23 Next Visit: None Last Refill: see med list   Please Advise

## 2024-06-14 ENCOUNTER — Ambulatory Visit: Admitting: Family Medicine

## 2024-06-25 ENCOUNTER — Ambulatory Visit: Admitting: Family Medicine

## 2024-10-19 ENCOUNTER — Ambulatory Visit: Admitting: Hematology and Oncology

## 2024-12-13 ENCOUNTER — Encounter: Admitting: Family Medicine
